# Patient Record
Sex: Female | Born: 1950
Health system: Southern US, Community
[De-identification: ages and names within clinical notes are randomized; demographics above are authoritative.]

## PROBLEM LIST (undated history)

## (undated) DIAGNOSIS — E785 Hyperlipidemia, unspecified: Secondary | ICD-10-CM

## (undated) DIAGNOSIS — K219 Gastro-esophageal reflux disease without esophagitis: Secondary | ICD-10-CM

## (undated) DIAGNOSIS — R112 Nausea with vomiting, unspecified: Secondary | ICD-10-CM

## (undated) DIAGNOSIS — M1990 Unspecified osteoarthritis, unspecified site: Secondary | ICD-10-CM

## (undated) DIAGNOSIS — T7840XA Allergy, unspecified, initial encounter: Secondary | ICD-10-CM

## (undated) DIAGNOSIS — F419 Anxiety disorder, unspecified: Secondary | ICD-10-CM

## (undated) DIAGNOSIS — I10 Essential (primary) hypertension: Secondary | ICD-10-CM

## (undated) HISTORY — DX: Essential (primary) hypertension: I10

## (undated) HISTORY — DX: Allergy, unspecified, initial encounter: T78.40XA

## (undated) HISTORY — DX: Hyperlipidemia, unspecified: E78.5

## (undated) HISTORY — DX: Anxiety disorder, unspecified: F41.9

---

## 2004-01-17 ENCOUNTER — Ambulatory Visit: Payer: Self-pay

## 2004-02-21 ENCOUNTER — Inpatient Hospital Stay: Payer: Self-pay | Admitting: Unknown Physician Specialty

## 2004-05-22 ENCOUNTER — Ambulatory Visit: Payer: Self-pay | Admitting: Unknown Physician Specialty

## 2004-06-04 ENCOUNTER — Ambulatory Visit: Payer: Self-pay | Admitting: Unknown Physician Specialty

## 2005-01-09 HISTORY — PX: TOTAL ABDOMINAL HYSTERECTOMY: SHX209

## 2005-04-11 ENCOUNTER — Ambulatory Visit: Payer: Self-pay | Admitting: Family Medicine

## 2005-04-11 DIAGNOSIS — L719 Rosacea, unspecified: Secondary | ICD-10-CM | POA: Insufficient documentation

## 2005-05-21 ENCOUNTER — Ambulatory Visit: Payer: Self-pay | Admitting: Family Medicine

## 2005-05-23 ENCOUNTER — Ambulatory Visit: Payer: Self-pay | Admitting: Family Medicine

## 2005-06-09 ENCOUNTER — Encounter: Payer: Self-pay | Admitting: Family Medicine

## 2005-06-24 ENCOUNTER — Ambulatory Visit: Payer: Self-pay | Admitting: Family Medicine

## 2005-06-26 ENCOUNTER — Ambulatory Visit: Payer: Self-pay | Admitting: Family Medicine

## 2005-09-25 ENCOUNTER — Ambulatory Visit: Payer: Self-pay | Admitting: Family Medicine

## 2005-11-12 ENCOUNTER — Ambulatory Visit: Payer: Self-pay | Admitting: Family Medicine

## 2006-07-31 ENCOUNTER — Encounter: Payer: Self-pay | Admitting: Family Medicine

## 2006-07-31 DIAGNOSIS — E1165 Type 2 diabetes mellitus with hyperglycemia: Secondary | ICD-10-CM

## 2006-07-31 DIAGNOSIS — F411 Generalized anxiety disorder: Secondary | ICD-10-CM | POA: Insufficient documentation

## 2006-07-31 DIAGNOSIS — I10 Essential (primary) hypertension: Secondary | ICD-10-CM | POA: Insufficient documentation

## 2006-07-31 DIAGNOSIS — J309 Allergic rhinitis, unspecified: Secondary | ICD-10-CM | POA: Insufficient documentation

## 2006-07-31 DIAGNOSIS — E119 Type 2 diabetes mellitus without complications: Secondary | ICD-10-CM | POA: Insufficient documentation

## 2006-08-04 DIAGNOSIS — T7840XA Allergy, unspecified, initial encounter: Secondary | ICD-10-CM | POA: Insufficient documentation

## 2006-08-05 ENCOUNTER — Ambulatory Visit: Payer: Self-pay | Admitting: Family Medicine

## 2006-08-05 LAB — CONVERTED CEMR LAB
ALT: 39 units/L (ref 0–40)
BUN: 15 mg/dL (ref 6–23)
Bilirubin, Direct: 0.1 mg/dL (ref 0.0–0.3)
CO2: 31 meq/L (ref 19–32)
Calcium: 9.5 mg/dL (ref 8.4–10.5)
Cholesterol: 264 mg/dL (ref 0–200)
GFR calc Af Amer: 112 mL/min
Glucose, Bld: 120 mg/dL — ABNORMAL HIGH (ref 70–99)
Microalb Creat Ratio: 2.9 mg/g (ref 0.0–30.0)
Total CHOL/HDL Ratio: 6
Total Protein: 6.6 g/dL (ref 6.0–8.3)
VLDL: 40 mg/dL (ref 0–40)

## 2006-08-07 ENCOUNTER — Ambulatory Visit: Payer: Self-pay | Admitting: Family Medicine

## 2006-08-07 ENCOUNTER — Encounter: Payer: Self-pay | Admitting: Family Medicine

## 2006-08-07 ENCOUNTER — Other Ambulatory Visit: Admission: RE | Admit: 2006-08-07 | Discharge: 2006-08-07 | Payer: Self-pay | Admitting: Family Medicine

## 2006-08-07 LAB — CONVERTED CEMR LAB: Pap Smear: NORMAL

## 2006-08-13 ENCOUNTER — Encounter (INDEPENDENT_AMBULATORY_CARE_PROVIDER_SITE_OTHER): Payer: Self-pay | Admitting: *Deleted

## 2006-08-15 ENCOUNTER — Encounter: Payer: Self-pay | Admitting: Family Medicine

## 2006-08-18 ENCOUNTER — Telehealth (INDEPENDENT_AMBULATORY_CARE_PROVIDER_SITE_OTHER): Payer: Self-pay | Admitting: *Deleted

## 2006-08-19 ENCOUNTER — Ambulatory Visit: Payer: Self-pay | Admitting: Family Medicine

## 2006-08-28 ENCOUNTER — Ambulatory Visit: Payer: Self-pay | Admitting: Family Medicine

## 2006-08-28 ENCOUNTER — Encounter: Payer: Self-pay | Admitting: Family Medicine

## 2006-09-09 ENCOUNTER — Encounter (INDEPENDENT_AMBULATORY_CARE_PROVIDER_SITE_OTHER): Payer: Self-pay | Admitting: *Deleted

## 2006-09-10 ENCOUNTER — Ambulatory Visit: Payer: Self-pay | Admitting: Family Medicine

## 2006-09-10 ENCOUNTER — Encounter (INDEPENDENT_AMBULATORY_CARE_PROVIDER_SITE_OTHER): Payer: Self-pay | Admitting: *Deleted

## 2006-09-11 ENCOUNTER — Telehealth (INDEPENDENT_AMBULATORY_CARE_PROVIDER_SITE_OTHER): Payer: Self-pay | Admitting: *Deleted

## 2006-09-18 ENCOUNTER — Ambulatory Visit: Payer: Self-pay | Admitting: Family Medicine

## 2006-09-18 LAB — CONVERTED CEMR LAB
ALT: 36 units/L — ABNORMAL HIGH (ref 0–35)
AST: 31 units/L (ref 0–37)

## 2006-11-04 ENCOUNTER — Ambulatory Visit: Payer: Self-pay | Admitting: Family Medicine

## 2006-11-04 LAB — CONVERTED CEMR LAB
AST: 22 units/L (ref 0–37)
Cholesterol: 162 mg/dL (ref 0–200)
HDL: 41.4 mg/dL (ref >39.0)
LDL Cholesterol: 92 mg/dL (ref 0–99)

## 2006-11-07 ENCOUNTER — Ambulatory Visit: Payer: Self-pay | Admitting: Family Medicine

## 2006-12-12 ENCOUNTER — Telehealth: Payer: Self-pay | Admitting: Family Medicine

## 2007-07-16 ENCOUNTER — Ambulatory Visit: Payer: Self-pay | Admitting: Family Medicine

## 2007-07-16 LAB — CONVERTED CEMR LAB
ALT: 34 units/L (ref 0–35)
Albumin: 3.9 g/dL (ref 3.5–5.2)
BUN: 19 mg/dL (ref 6–23)
Calcium: 9.5 mg/dL (ref 8.4–10.5)
Cholesterol: 142 mg/dL (ref 0–200)
Creatinine, Ser: 0.7 mg/dL (ref 0.4–1.2)
GFR calc Af Amer: 112 mL/min
Glucose, Bld: 129 mg/dL — ABNORMAL HIGH (ref 70–99)
HDL: 39 mg/dL (ref >39.0)
Microalb Creat Ratio: 15 mg/g (ref 0.0–30.0)
Microalb, Ur: 1.7 mg/dL (ref 0.0–1.9)
Total Protein: 6.6 g/dL (ref 6.0–8.3)
Triglycerides: 127 mg/dL (ref 0–149)
VLDL: 25 mg/dL (ref 0–40)

## 2007-07-21 ENCOUNTER — Other Ambulatory Visit: Admission: RE | Admit: 2007-07-21 | Discharge: 2007-07-21 | Payer: Self-pay | Admitting: Family Medicine

## 2007-07-21 ENCOUNTER — Encounter: Payer: Self-pay | Admitting: Family Medicine

## 2007-07-21 ENCOUNTER — Ambulatory Visit: Payer: Self-pay | Admitting: Family Medicine

## 2007-07-21 LAB — CONVERTED CEMR LAB: Pap Smear: NORMAL

## 2007-07-27 ENCOUNTER — Encounter (INDEPENDENT_AMBULATORY_CARE_PROVIDER_SITE_OTHER): Payer: Self-pay | Admitting: *Deleted

## 2007-08-05 ENCOUNTER — Ambulatory Visit: Payer: Self-pay | Admitting: Family Medicine

## 2007-08-05 ENCOUNTER — Encounter (INDEPENDENT_AMBULATORY_CARE_PROVIDER_SITE_OTHER): Payer: Self-pay | Admitting: *Deleted

## 2007-08-05 LAB — CONVERTED CEMR LAB: OCCULT 2: NEGATIVE

## 2007-10-01 ENCOUNTER — Encounter: Payer: Self-pay | Admitting: Family Medicine

## 2007-10-01 ENCOUNTER — Ambulatory Visit: Payer: Self-pay | Admitting: Family Medicine

## 2007-10-05 ENCOUNTER — Encounter (INDEPENDENT_AMBULATORY_CARE_PROVIDER_SITE_OTHER): Payer: Self-pay | Admitting: *Deleted

## 2008-01-14 ENCOUNTER — Ambulatory Visit: Payer: Self-pay | Admitting: Family Medicine

## 2008-01-14 LAB — CONVERTED CEMR LAB: Hgb A1c MFr Bld: 6.8 % — ABNORMAL HIGH (ref 4.6–6.0)

## 2008-01-20 ENCOUNTER — Ambulatory Visit: Payer: Self-pay | Admitting: Family Medicine

## 2008-01-20 DIAGNOSIS — M722 Plantar fascial fibromatosis: Secondary | ICD-10-CM

## 2008-08-18 ENCOUNTER — Ambulatory Visit: Payer: Self-pay | Admitting: Family Medicine

## 2008-08-18 LAB — CONVERTED CEMR LAB
CO2: 29 meq/L (ref 19–32)
GFR calc non Af Amer: 91.98 mL/min (ref >60)
Glucose, Bld: 138 mg/dL — ABNORMAL HIGH (ref 70–99)
Potassium: 4.4 meq/L (ref 3.5–5.1)
Sodium: 143 meq/L (ref 135–145)

## 2008-09-09 ENCOUNTER — Encounter: Payer: Self-pay | Admitting: Family Medicine

## 2008-09-19 ENCOUNTER — Ambulatory Visit: Payer: Self-pay | Admitting: Gastroenterology

## 2008-09-19 ENCOUNTER — Encounter: Payer: Self-pay | Admitting: Family Medicine

## 2008-09-19 LAB — HM COLONOSCOPY: HM Colonoscopy: NORMAL

## 2008-09-20 ENCOUNTER — Telehealth: Payer: Self-pay | Admitting: Family Medicine

## 2008-10-05 ENCOUNTER — Ambulatory Visit: Payer: Self-pay | Admitting: Family Medicine

## 2008-10-05 LAB — CONVERTED CEMR LAB
Alkaline Phosphatase: 86 units/L (ref 39–117)
BUN: 16 mg/dL (ref 6–23)
Basophils Absolute: 0 10*3/uL (ref 0.0–0.1)
Basophils Relative: 0.5 % (ref 0.0–3.0)
Bilirubin, Direct: 0.1 mg/dL (ref 0.0–0.3)
CO2: 30 meq/L (ref 19–32)
Calcium: 9.5 mg/dL (ref 8.4–10.5)
Chloride: 108 meq/L (ref 96–112)
Cholesterol: 144 mg/dL (ref 0–200)
Creatinine, Ser: 0.8 mg/dL (ref 0.4–1.2)
Creatinine,U: 120.4 mg/dL
Eosinophils Absolute: 0.2 10*3/uL (ref 0.0–0.7)
HDL: 38.9 mg/dL — ABNORMAL LOW (ref >39.00)
Hgb A1c MFr Bld: 6.1 % (ref 4.6–6.5)
Lymphocytes Relative: 24.2 % (ref 12.0–46.0)
MCHC: 34.1 g/dL (ref 30.0–36.0)
MCV: 87 fL (ref 78.0–100.0)
Microalb Creat Ratio: 8.3 mg/g (ref 0.0–30.0)
Microalb, Ur: 1 mg/dL (ref 0.0–1.9)
Monocytes Absolute: 0.3 10*3/uL (ref 0.1–1.0)
Neutrophils Relative %: 64.4 % (ref 43.0–77.0)
Platelets: 220 10*3/uL (ref 150.0–400.0)
RBC: 4.66 M/uL (ref 3.87–5.11)
RDW: 13.5 % (ref 11.5–14.6)
Total Bilirubin: 1.2 mg/dL (ref 0.3–1.2)
Total CHOL/HDL Ratio: 4
Total Protein: 7.1 g/dL (ref 6.0–8.3)
Triglycerides: 152 mg/dL — ABNORMAL HIGH (ref 0.0–149.0)

## 2008-10-06 LAB — CONVERTED CEMR LAB: Vit D, 25-Hydroxy: 26 ng/mL — ABNORMAL LOW (ref 30–89)

## 2008-10-13 ENCOUNTER — Ambulatory Visit: Payer: Self-pay | Admitting: Family Medicine

## 2008-10-31 ENCOUNTER — Ambulatory Visit: Payer: Self-pay | Admitting: Family Medicine

## 2008-10-31 ENCOUNTER — Encounter: Payer: Self-pay | Admitting: Family Medicine

## 2008-11-03 ENCOUNTER — Encounter (INDEPENDENT_AMBULATORY_CARE_PROVIDER_SITE_OTHER): Payer: Self-pay | Admitting: *Deleted

## 2008-11-15 ENCOUNTER — Ambulatory Visit: Payer: Self-pay | Admitting: Family Medicine

## 2008-11-15 LAB — CONVERTED CEMR LAB
CO2: 28 meq/L (ref 19–32)
Calcium: 9.2 mg/dL (ref 8.4–10.5)
Creatinine, Ser: 0.7 mg/dL (ref 0.4–1.2)
GFR calc non Af Amer: 91.9 mL/min (ref >60)
Sodium: 140 meq/L (ref 135–145)

## 2008-12-23 ENCOUNTER — Ambulatory Visit: Payer: Self-pay | Admitting: Family Medicine

## 2009-02-14 ENCOUNTER — Telehealth: Payer: Self-pay | Admitting: Family Medicine

## 2009-04-12 ENCOUNTER — Ambulatory Visit: Payer: Self-pay | Admitting: Family Medicine

## 2009-04-12 LAB — CONVERTED CEMR LAB
BUN: 16 mg/dL (ref 6–23)
Chloride: 108 meq/L (ref 96–112)
Creatinine, Ser: 0.8 mg/dL (ref 0.4–1.2)
GFR calc non Af Amer: 78.66 mL/min (ref >60)
Hgb A1c MFr Bld: 6.2 % (ref 4.6–6.5)
Potassium: 4.4 meq/L (ref 3.5–5.1)

## 2009-04-17 ENCOUNTER — Ambulatory Visit: Payer: Self-pay | Admitting: Family Medicine

## 2009-06-05 ENCOUNTER — Ambulatory Visit: Payer: Self-pay | Admitting: Family Medicine

## 2009-08-30 ENCOUNTER — Telehealth: Payer: Self-pay | Admitting: Family Medicine

## 2009-09-18 ENCOUNTER — Encounter: Payer: Self-pay | Admitting: Family Medicine

## 2009-09-26 ENCOUNTER — Encounter: Payer: Self-pay | Admitting: Family Medicine

## 2009-09-26 ENCOUNTER — Telehealth: Payer: Self-pay | Admitting: Family Medicine

## 2009-11-16 ENCOUNTER — Telehealth: Payer: Self-pay | Admitting: Family Medicine

## 2009-11-20 ENCOUNTER — Ambulatory Visit: Payer: Self-pay | Admitting: Family Medicine

## 2009-11-20 ENCOUNTER — Encounter: Payer: Self-pay | Admitting: Family Medicine

## 2009-11-22 ENCOUNTER — Encounter (INDEPENDENT_AMBULATORY_CARE_PROVIDER_SITE_OTHER): Payer: Self-pay | Admitting: *Deleted

## 2009-12-15 ENCOUNTER — Ambulatory Visit: Payer: Self-pay | Admitting: Family Medicine

## 2009-12-15 LAB — HM DIABETES FOOT EXAM

## 2010-01-02 ENCOUNTER — Ambulatory Visit: Payer: Self-pay | Admitting: Family Medicine

## 2010-01-03 ENCOUNTER — Telehealth: Payer: Self-pay | Admitting: Family Medicine

## 2010-01-04 LAB — CONVERTED CEMR LAB
CO2: 25 meq/L (ref 19–32)
Calcium: 9.7 mg/dL (ref 8.4–10.5)
Chloride: 109 meq/L (ref 96–112)
Creatinine, Ser: 0.9 mg/dL (ref 0.4–1.2)
Sodium: 144 meq/L (ref 135–145)

## 2010-01-08 ENCOUNTER — Telehealth: Payer: Self-pay | Admitting: Family Medicine

## 2010-01-30 ENCOUNTER — Telehealth: Payer: Self-pay | Admitting: Family Medicine

## 2010-01-30 ENCOUNTER — Encounter: Payer: Self-pay | Admitting: Family Medicine

## 2010-02-05 ENCOUNTER — Ambulatory Visit: Payer: Self-pay | Admitting: Internal Medicine

## 2010-02-23 ENCOUNTER — Encounter: Payer: Self-pay | Admitting: Family Medicine

## 2010-02-23 ENCOUNTER — Ambulatory Visit: Payer: Self-pay | Admitting: Family Medicine

## 2010-02-23 LAB — CONVERTED CEMR LAB
Glucose, Urine, Semiquant: NEGATIVE
Ketones, urine, test strip: NEGATIVE
Specific Gravity, Urine: >=1.03
pH: 5

## 2010-02-27 ENCOUNTER — Ambulatory Visit: Payer: Self-pay | Admitting: Family Medicine

## 2010-03-06 ENCOUNTER — Ambulatory Visit
Admission: RE | Admit: 2010-03-06 | Discharge: 2010-03-06 | Payer: Self-pay | Source: Home / Self Care | Attending: Internal Medicine | Admitting: Internal Medicine

## 2010-03-09 LAB — CONVERTED CEMR LAB
ALT: 45 units/L — ABNORMAL HIGH (ref 0–35)
AST: 33 units/L (ref 0–37)
Albumin: 3.7 g/dL (ref 3.5–5.2)
HDL: 36.6 mg/dL — ABNORMAL LOW (ref >39.00)
Hgb A1c MFr Bld: 6.6 % — ABNORMAL HIGH (ref 4.6–6.5)
Triglycerides: 174 mg/dL — ABNORMAL HIGH (ref 0.0–149.0)

## 2010-04-09 ENCOUNTER — Ambulatory Visit
Admission: RE | Admit: 2010-04-09 | Discharge: 2010-04-09 | Payer: Self-pay | Source: Home / Self Care | Attending: Family Medicine | Admitting: Family Medicine

## 2010-04-09 DIAGNOSIS — J0100 Acute maxillary sinusitis, unspecified: Secondary | ICD-10-CM | POA: Insufficient documentation

## 2010-04-09 DIAGNOSIS — J019 Acute sinusitis, unspecified: Secondary | ICD-10-CM | POA: Insufficient documentation

## 2010-04-10 NOTE — Progress Notes (Signed)
Summary: Has a cold  Phone Note Outgoing Call Call back at [phone]   Call placed by: Deborah Axon, LPN Summary of Call: Called patient regarding BP check. Patient stated that she has a cold  and wants to know what is safe for her to take OTC. Patient would like for you to call her husband Deborah Alvarado at [phone] and let him know what you recommend because it is hard to reach her at work. Initial call taken by: Deborah Axon LPN,  January 03, 2010 8:53 AM  Follow-up for Phone Call        Tell them that plain tylenol (for pain/fever), robitussen (cough), benadryl or claritin (runny nose) would be reasonable.  Don't take benadryl and claritin together.  I wouldn't take any meds with "-D" in the name (claritin-D) as that can affect BP.  Follow-up by: Crawford Givens MD,  January 03, 2010 12:07 PM  Additional Follow-up for Phone Call Additional follow up Details #1::        Husband, Deborah Alvarado, advised. Additional Follow-up by: Delilah Shan CMA Duncan Dull),  January 03, 2010 12:11 PM

## 2010-04-10 NOTE — Assessment & Plan Note (Signed)
Summary: 6WK F/U FOR BP CHECK / LFW   Vital Signs:  Patient profile:   60 year old female Weight:      188.25 pounds Temp:     98.3 degrees F oral Pulse rate:   80 / minute Pulse rhythm:   regular BP sitting:   118 / 80  (left arm) Cuff size:   large  Vitals Entered By: Sydell Axon LPN (June 05, 2009 10:02 AM) CC: 6 Week follow-up on BP   History of Present Illness: Pt here for BP check after increasing Micardis last visit. She is tolerating the increase w/o difficulty and feels well today w/o complaint.  She gets lotys of exercise at work.  Problems Prior to Update: 1)  Other Screening Mammogram  (ICD-V76.12) 2)  Plantar Fasciitis, Left  (ICD-728.71) 3)  Special Screening Malig Neoplasms Other Sites  (ICD-V76.49) 4)  Acne Rosacea  (ICD-695.3) 5)  Health Maintenance Exam  (ICD-V70.0) 6)  Allergy  (ICD-995.3) 7)  Hypercholesterolemia/ Trig  (ICD-272.0) 8)  Hypertension  (ICD-401.9) 9)  Diabetes Mellitus, Type II  (ICD-250.00) 10)  Anxiety  (ICD-300.00) 11)  Allergic Rhinitis  (ICD-477.9)  Medications Prior to Update: 1)  Metformin Hcl 500 Mg Tabs (Metformin Hcl) .... One Tab By Mouth Two Times A Day 2)  Monodox 100 Mg Caps (Doxycycline Monohydrate) .... One Tab By Mouth Once Daily States Taking As Needed 3)  Simvastatin 80 Mg Tabs (Simvastatin) .... One Tab By Mouth At Night 4)  Fish Oil Concentrate 1000 Mg  Caps (Omega-3 Fatty Acids) .Marland Kitchen.. 1 Daily By Mouth 5)  Glimepiride 2 Mg Tabs (Glimepiride) .... One Tab By Mouth Two Times A Day 6)  Micardis 80 Mg Tabs (Telmisartan) .... One Tab By Mouth Once Daily 7)  Norvasc 5 Mg Tabs (Amlodipine Besylate) .... One Tab By Mouth At Night  Allergies: No Known Drug Allergies  Physical Exam  General:  Well-developed,well-nourished,in no acute distress; alert,appropriate and cooperative throughout examination, mildly obese. Head:  Normocephalic and atraumatic without obvious abnormalities. No apparent alopecia or balding.  Eyes:   Conjunctiva clear bilaterally.  Ears:  External ear exam shows no significant lesions or deformities.  Otoscopic examination reveals clear canals, tympanic membranes are intact bilaterally without bulging, retraction, inflammation or discharge. Hearing is grossly normal bilaterally. Canals mildly flaky. Nose:  External nasal examination shows no deformity or inflammation. Nasal mucosa are pink and moist without lesions or exudates. Mouth:  Oral mucosa and oropharynx without lesions or exudates.  Teeth in good repair. Neck:  No deformities, masses, or tenderness noted. Chest Wall:  No deformities, masses, or tenderness noted. Lungs:  Normal respiratory effort, chest expands symmetrically. Lungs are clear to auscultation, no crackles or wheezes. Heart:  Normal rate and regular rhythm. S1 and S2 normal without gallop, murmur, click, rub or other extra sounds.   Impression & Recommendations:  Problem # 1:  HYPERTENSION (ICD-401.9) Assessment Improved Cont curr meds.  Her updated medication list for this problem includes:    Micardis 80 Mg Tabs (Telmisartan) ..... One tab by mouth once daily    Norvasc 5 Mg Tabs (Amlodipine besylate) ..... One tab by mouth at night  BP today: 118/80 Prior BP: 130/94 (04/17/2009)  Labs Reviewed: K+: 4.4 (04/12/2009) Creat: : 0.8 (04/12/2009)   Chol: 144 (10/05/2008)   HDL: 38.90 (10/05/2008)   LDL: 75 (10/05/2008)   TG: 152.0 (10/05/2008)  Problem # 2:  DIABETES MELLITUS, TYPE II (ICD-250.00) Assessment: Unchanged Control continues good. Nos at home cont low 100s.  Her updated medication list for this problem includes:    Metformin Hcl 500 Mg Tabs (Metformin hcl) ..... One tab by mouth two times a day    Glimepiride 2 Mg Tabs (Glimepiride) ..... One tab by mouth two times a day    Micardis 80 Mg Tabs (Telmisartan) ..... One tab by mouth once daily  Labs Reviewed: Creat: 0.8 (04/12/2009)    Reviewed HgBA1c results: 6.2 (04/12/2009)  6.1  (10/05/2008)  Complete Medication List: 1)  Metformin Hcl 500 Mg Tabs (Metformin hcl) .... One tab by mouth two times a day 2)  Monodox 100 Mg Caps (Doxycycline monohydrate) .... One tab by mouth once daily states taking as needed 3)  Simvastatin 80 Mg Tabs (Simvastatin) .... One tab by mouth at night 4)  Fish Oil Concentrate 1000 Mg Caps (Omega-3 fatty acids) .Marland Kitchen.. 1 daily by mouth 5)  Glimepiride 2 Mg Tabs (Glimepiride) .... One tab by mouth two times a day 6)  Micardis 80 Mg Tabs (Telmisartan) .... One tab by mouth once daily 7)  Norvasc 5 Mg Tabs (Amlodipine besylate) .... One tab by mouth at night 8)  Vitamin D 1000 Unit Tabs (Cholecalciferol) .... Take one by mouth daily  Patient Instructions: 1)  Call in May for appt in Oct/Nov with A1C and Bmet prior. Will then schwedule Comp Exam after being seen.  Current Allergies (reviewed today): No known allergies   Appended Document: 6WK F/U FOR BP CHECK / LFW Consider again getting DEXA scan as discussed last visit when seen next time.

## 2010-04-10 NOTE — Letter (Signed)
Summary: Care Consideration Regarding Eye Exam/[facility]  Care Consideration Regarding Eye Exam/[facility]   Imported By: Lanelle Bal 10/03/2009 13:08:41  _____________________________________________________________________  External Attachment:    Type:   Image     Comment:   External Document

## 2010-04-10 NOTE — Progress Notes (Signed)
Summary: cough  Phone Note Call from Patient Call back at Home Phone [phone]   Caller: Patient Call For: Dr. Para March  Summary of Call: Patient states that she has had a nagging cough for a couple of weeks. She says that it keeps her awake at night. She has tried Robitussin, and has also tried mucinex. She says that neither have worked. She is asking if she could get something for the cough called [facility] to [facility]. Please advise.  Initial call taken by: Melody Comas,  January 08, 2010 4:39 PM  Follow-up for Phone Call        please call in.  If cough persists, needs OV as this may be related to lisinopril. sedation caution on the cough syrup.  please call into [facility].  Follow-up by: Crawford Givens MD,  January 08, 2010 4:53 PM  Additional Follow-up for Phone Call Additional follow up Details #1::        Patient Advised.  Medication phoned to pharmacy.  Additional Follow-up by: Delilah Shan CMA (AAMA),  January 08, 2010 5:01 PM    New/Updated Medications: HYDROMET 5-1.5 MG/5ML SYRP (HYDROCODONE-HOMATROPINE) 5 ml by mouth three times a day as needed for cough with sedation caution Prescriptions: HYDROMET 5-1.5 MG/5ML SYRP (HYDROCODONE-HOMATROPINE) 5 ml by mouth three times a day as needed for cough with sedation caution  #4oz x 0   Entered and Authorized by:   Crawford Givens MD   Signed by:   Crawford Givens MD on 01/08/2010   Method used:   Telephoned to ...       [facility] [phone]* (retail)       [address]       [place], [place]  [address]       Ph: [phone]       Fax: [phone]   RxID:   [id number]

## 2010-04-10 NOTE — Medication Information (Signed)
Summary: Order for Diabetes Testing Supplies/[facility]  Order for Diabetes Testing Supplies/[facility]   Imported By: Maryln Gottron 02/05/2010 15:02:44  _____________________________________________________________________  External Attachment:    Type:   Image     Comment:   External Document

## 2010-04-10 NOTE — Assessment & Plan Note (Signed)
Summary: GET ESTABLISHED- TRANSFER FROM DR [facility]   Vital Signs:  Patient profile:   60 year old female Height:      64 inches Weight:      186.50 pounds BMI:     32.13 Temp:     97.9 degrees F oral Pulse rate:   80 / minute Pulse rhythm:   regular BP sitting:   166 / 106  (left arm) Cuff size:   regular  Vitals Entered By: Delilah Shan CMA Duncan Dull) (December 15, 2009 8:09 AM) CC: Get Established from [facility]   History of Present Illness: Elevated Cholesterol: Using medications without problems:off meds due to pharmacy call Muscle aches: not prev on med Other complaints: no  Hypertension:      Using medication without problems or lightheadedness: yes, but off norvasc for a few weeks Chest pain with exertion:no Edema:no Short of breath:no Average home BPs:not checked Other issues: no  Diabetes:  Using medications without difficulties:yes Hypoglycemic episodes: rare Hyperglycemic episodes: no symptoms  Feet problems: no  Blood Sugars averaging: not checked eye exam within last year: follow up planned for next few months per patient  Allergies: No Known Drug Allergies  Past History:  Family History: Last updated: 12/15/2009 Father: Unknown Mother: Dec 20's from leukemia when the patient was 52 years old Brother A   DM  Social History: Last updated: 12/15/2009 Marital Status: Married, 1972 Children: 2, out of the home Occupation:  works at [employer] minimal exercise no tob  no alcohol   Past Medical History: Allergic rhinitis Anxiety Diabetes mellitus, type II Hypertension HLD  Past Surgical History: NSVD x 2 TAH due to fibroids  01/2005 Colonoscopy 2010 Nml (Dr Bluford Kaufmann)   10 yrs  Family History: Father: Unknown Mother: Dec 20's from leukemia when the patient was 5 years old Brother A   DM  Social History: Marital Status: Married, 1972 Children: 2, out of the home Occupation:  works at [employer] minimal exercise no tob  no alcohol   Review of  Systems       See HPI.  Otherwise negative.    Physical Exam  General:  GEN: nad, alert and oriented HEENT: mucous membranes moist NECK: supple w/o LA CV: rrr.  no murmur PULM: ctab, no inc wob ABD: soft, +bs EXT: no edema SKIN: no acute rash   Diabetes Management Exam:    Foot Exam (with socks and/or shoes not present):       Sensory-Pinprick/Light touch:          Left medial foot (L-4): normal          Left dorsal foot (L-5): normal          Left lateral foot (S-1): normal          Right medial foot (L-4): normal          Right dorsal foot (L-5): normal          Right lateral foot (S-1): normal       Sensory-Monofilament:          Left foot: normal          Right foot: normal       Inspection:          Left foot: normal          Right foot: normal       Nails:          Left foot: normal          Right foot:  normal   Impression & Recommendations:  Problem # 1:  HYPERCHOLESTEROLEMIA/ TRIG (ICD-272.0) decrease statin to 40mg ; patient prev tolerated and didn't ever have any adverse effect.  stop norvasc. The following medications were removed from the medication list:    Simvastatin 80 Mg Tabs (Simvastatin) ..... Hold  one tab by mouth at night Her updated medication list for this problem includes:    Simvastatin 40 Mg Tabs (Simvastatin) .Marland Kitchen... 1 by mouth once daily  Problem # 2:  HYPERTENSION (ICD-401.9) Change to ACE due to cost.  See instructions.  d/w patient re: low risk of cough.  The following medications were removed from the medication list:    Micardis 80 Mg Tabs (Telmisartan) ..... One tab by mouth once daily    Norvasc 5 Mg Tabs (Amlodipine besylate) ..... One tab by mouth at night Her updated medication list for this problem includes:    Lisinopril 40 Mg Tabs (Lisinopril) .Marland Kitchen... 1 by mouth once daily  Problem # 3:  DIABETES MELLITUS, TYPE II (ICD-250.00) continue current meds.  return for labs later on.  d/w patient MW:[id number] and diet.  she understood.   The following medications were removed from the medication list:    Micardis 80 Mg Tabs (Telmisartan) ..... One tab by mouth once daily Her updated medication list for this problem includes:    Metformin Hcl 500 Mg Tabs (Metformin hcl) ..... One tab by mouth two times a day    Glimepiride 2 Mg Tabs (Glimepiride) ..... One tab by mouth two times a day    Lisinopril 40 Mg Tabs (Lisinopril) .Marland Kitchen... 1 by mouth once daily  Complete Medication List: 1)  Metformin Hcl 500 Mg Tabs (Metformin hcl) .... One tab by mouth two times a day 2)  Monodox 100 Mg Caps (Doxycycline monohydrate) .... One tab by mouth once daily states taking as needed 3)  Fish Oil Concentrate 1000 Mg Caps (Omega-3 fatty acids) .Marland Kitchen.. 1 daily by mouth 4)  Glimepiride 2 Mg Tabs (Glimepiride) .... One tab by mouth two times a day 5)  Vitamin D 1000 Unit Tabs (Cholecalciferol) .... Take one by mouth daily 6)  Lisinopril 40 Mg Tabs (Lisinopril) .Marland Kitchen.. 1 by mouth once daily 7)  Simvastatin 40 Mg Tabs (Simvastatin) .Marland Kitchen.. 1 by mouth once daily   Patient Instructions: 1)  Come back for nonfasting labs in about 2 weeks.  BMET---401.1 2)  Have a nurse check your pressure at that visit and let me know.  I'll notify you about what you need to do with your lisinopril dose.   3)  Plan on coming back in 2 months for fasting labs- lipid, hepatic panel-272.0  and A1c-250.00. 4)  Take care.  I was glad to see you.  Prescriptions: SIMVASTATIN 40 MG TABS (SIMVASTATIN) 1 by mouth once daily  #90 x 3   Entered and Authorized by:   Crawford Givens MD   Signed by:   Crawford Givens MD on 12/15/2009   Method used:   [facility]        [facility]  [address] [phone][facility] (retail)       [address]       [place], [place]  [address]       Ph: [phone]       Fax: [phone]   RxID:   [id number] LISINOPRIL 40 MG TABS (LISINOPRIL) 1 by mouth once daily  #90 x 3   Entered and Authorized by:  Crawford Givens MD   Signed by:   Crawford Givens MD on  12/15/2009   Method used:   Electronically to        [facility]  [address] [phone]* (retail)       [address]       [place], [place]  [address]       Ph: [phone]       Fax: [phone]   RxID:   [id number]   Current Allergies (reviewed today): No known allergies    Appended Document: GET ESTABLISHED- TRANSFER FROM DR [facility] Flu Vaccine Consent Questions     Do you have a history of severe allergic reactions to this vaccine? no    Any prior history of allergic reactions to egg and/or gelatin? no    Do you have a sensitivity to the preservative Thimersol? no    Do you have a past history of Guillan-Barre Syndrome? no    Do you currently have an acute febrile illness? no    Have you ever had a severe reaction to latex? no    Vaccine information given and explained to patient? yes    Are you currently pregnant? no    Lot Number:[id number]   Exp Date:09/08/2010   Site Given  Left Deltoid IM Lugene Fuquay CMA (AAMA)  December 15, 2009 8:57 AM

## 2010-04-10 NOTE — Progress Notes (Signed)
Summary: Rx Meloxicam  Phone Note Refill Request Call back at [phone] Message from:  [facility] on August 30, 2009 4:53 PM  Received refill request from pharmacy for Meloxicam 7.5 mg, take one tablet by mouth every day. Medication is not on med sheet   Method Requested: Electronic Initial call taken by: Sydell Axon LPN,  August 30, 2009 4:54 PM  Follow-up for Phone Call        I don't have this pt ever on this medication per her medication  review. I would prefer she not use it as well as it can make her BP go up, as can all NSAIDs incl Aleve and Advil, Motrin or IBP. Use high dose Tyl 500mg  2 three times a day....very safe and not the BP risk. Follow-up by: Shaune Leeks MD,  August 30, 2009 5:02 PM  Additional Follow-up for Phone Call Additional follow up Details #1::        No answer at either number. Sydell Axon LPN  August 30, 2009 5:07 PM  Patient notified as instructed by telephone. Was informed by patient that she has not taken this for a while and did not request this from her pharmacy. Patient stated that she will call her pharmacy and advise them of this and make sure that they take this off of automatic refills. Additional Follow-up by: Sydell Axon LPN,  August 31, 2009 8:36 AM

## 2010-04-10 NOTE — Progress Notes (Signed)
Summary: wants order for mammogram  Phone Note Call from Patient Call back at Home Phone [phone]   Caller: Patient Summary of Call: Pt requests order for mammogram, she goes to [facility] breast center. Initial call taken by: Lowella Petties CMA,  November 16, 2009 3:30 PM  New Problems: OTHER SCREENING MAMMOGRAM (ICD-V76.12)   New Problems: OTHER SCREENING MAMMOGRAM (ICD-V76.12)    Complete Medication List: 1)  Metformin Hcl 500 Mg Tabs (Metformin hcl) .... One tab by mouth two times a day 2)  Monodox 100 Mg Caps (Doxycycline monohydrate) .... One tab by mouth once daily states taking as needed 3)  Simvastatin 80 Mg Tabs (Simvastatin) .... One tab by mouth at night 4)  Fish Oil Concentrate 1000 Mg Caps (Omega-3 fatty acids) .Marland Kitchen.. 1 daily by mouth 5)  Glimepiride 2 Mg Tabs (Glimepiride) .... One tab by mouth two times a day 6)  Micardis 80 Mg Tabs (Telmisartan) .... One tab by mouth once daily 7)  Norvasc 5 Mg Tabs (Amlodipine besylate) .... One tab by mouth at night 8)  Vitamin D 1000 Unit Tabs (Cholecalciferol) .... Take one by mouth daily  Other Orders: Radiology Referral (Radiology)

## 2010-04-10 NOTE — Medication Information (Signed)
Summary: [facility]  [facility]   Imported By: Lanelle Bal 10/03/2009 12:58:34  _____________________________________________________________________  External Attachment:    Type:   Image     Comment:   External Document

## 2010-04-10 NOTE — Assessment & Plan Note (Signed)
Summary: F/U AFTER LABS / LFW   Vital Signs:  Patient profile:   60 year old female Weight:      187.75 pounds Temp:     97.8 degrees F oral Pulse rate:   84 / minute Pulse rhythm:   regular BP sitting:   130 / 94  (left arm) Cuff size:   large  Vitals Entered By: Sydell Axon LPN (April 17, 2009 8:11 AM) CC: Follow-up on labs   History of Present Illness: Pt here for 6 month followup. Had increased he Micardis last time for BP control. She was told plast night a fellow worker quit. She feels well except for continued left elbow pain from her prior fracture and related left trapezial discomfort .Marland KitchenMarland Kitchenpresumably related to altered mechanics with the elbow.  Problems Prior to Update: 1)  Other Screening Mammogram  (ICD-V76.12) 2)  Plantar Fasciitis, Left  (ICD-728.71) 3)  Special Screening Malig Neoplasms Other Sites  (ICD-V76.49) 4)  Acne Rosacea  (ICD-695.3) 5)  Health Maintenance Exam  (ICD-V70.0) 6)  Allergy  (ICD-995.3) 7)  Hypercholesterolemia/ Trig  (ICD-272.0) 8)  Hypertension  (ICD-401.9) 9)  Diabetes Mellitus, Type II  (ICD-250.00) 10)  Anxiety  (ICD-300.00) 11)  Allergic Rhinitis  (ICD-477.9)  Medications Prior to Update: 1)  Metformin Hcl 500 Mg Tabs (Metformin Hcl) .... One Tab By Mouth Two Times A Day 2)  Micardis 40 Mg Tabs (Telmisartan) .... Take One By Mouth At Night 3)  Monodox 100 Mg Caps (Doxycycline Monohydrate) .... One Tab By Mouth Once Daily States Taking As Needed 4)  Simvastatin 80 Mg Tabs (Simvastatin) .... One Tab By Mouth At Night 5)  Fish Oil Concentrate 1000 Mg  Caps (Omega-3 Fatty Acids) .Marland Kitchen.. 1 Daily By Mouth 6)  Glimepiride 2 Mg Tabs (Glimepiride) .... One Tab By Mouth Two Times A Day 7)  Micardis 80 Mg Tabs (Telmisartan) .... One Tab By Mouth Once Daily 8)  Fluconazole 150 Mg Tabs (Fluconazole) .... One Tab By Mouth Qd  Allergies: No Known Drug Allergies  Physical Exam  General:  Well-developed,well-nourished,in no acute distress;  alert,appropriate and cooperative throughout examination, mildly obese. Head:  Normocephalic and atraumatic without obvious abnormalities. No apparent alopecia or balding.  Eyes:  Conjunctiva clear bilaterally.  Ears:  External ear exam shows no significant lesions or deformities.  Otoscopic examination reveals clear canals, tympanic membranes are intact bilaterally without bulging, retraction, inflammation or discharge. Hearing is grossly normal bilaterally. Canals mildly flaky. Nose:  External nasal examination shows no deformity or inflammation. Nasal mucosa are pink and moist without lesions or exudates. Mouth:  Oral mucosa and oropharynx without lesions or exudates.  Teeth in good repair. Neck:  No deformities, masses, or tenderness noted. Chest Wall:  No deformities, masses, or tenderness noted. Lungs:  Normal respiratory effort, chest expands symmetrically. Lungs are clear to auscultation, no crackles or wheezes. Heart:  Normal rate and regular rhythm. S1 and S2 normal without gallop, murmur, click, rub or other extra sounds. Msk:  L trap area superiorly minimally tender but tight.   Impression & Recommendations:  Problem # 1:  HYPERTENSION (ICD-401.9)  The following medications were removed from the medication list:    Micardis 40 Mg Tabs (Telmisartan) .Marland Kitchen... Take one by mouth at night Her updated medication list for this problem includes:    Micardis 80 Mg Tabs (Telmisartan) ..... One tab by mouth once daily    Norvasc 5 Mg Tabs (Amlodipine besylate) ..... One tab by mouth at night  BP today: 130/94  BP by me 160/95   Will add Amlodipine 5mg . Recheck in 6 weeks. Prior BP: 140/86 (10/13/2008)  Labs Reviewed: K+: 4.4 (04/12/2009) Creat: : 0.8 (04/12/2009)   Chol: 144 (10/05/2008)   HDL: 38.90 (10/05/2008)   LDL: 75 (10/05/2008)   TG: 152.0 (10/05/2008)  BP today: 130/94 Prior BP: 140/86 (10/13/2008)  Labs Reviewed: K+: 4.4 (04/12/2009) Creat: : 0.8 (04/12/2009)   Chol: 144  (10/05/2008)   HDL: 38.90 (10/05/2008)   LDL: 75 (10/05/2008)   TG: 152.0 (10/05/2008)  Problem # 2:  DIABETES MELLITUS, TYPE II (ICD-250.00) Assessment: Unchanged  Stable. Cont curr therapy.  The following medications were removed from the medication list:    Micardis 40 Mg Tabs (Telmisartan) .Marland Kitchen... Take one by mouth at night Her updated medication list for this problem includes:    Metformin Hcl 500 Mg Tabs (Metformin hcl) ..... One tab by mouth two times a day    Glimepiride 2 Mg Tabs (Glimepiride) ..... One tab by mouth two times a day    Micardis 80 Mg Tabs (Telmisartan) ..... One tab by mouth once daily  Labs Reviewed: Creat: 0.8 (04/12/2009)    Reviewed HgBA1c results: 6.2 (04/12/2009)  6.1 (10/05/2008)  Problem # 3:  HYPERCHOLESTEROLEMIA/ TRIG (ICD-272.0) Assessment: Unchanged Adequate, cont curr meds. Script written. Her updated medication list for this problem includes:    Simvastatin 80 Mg Tabs (Simvastatin) ..... One tab by mouth at night  Labs Reviewed: SGOT: 53 (10/05/2008)   SGPT: 67 (10/05/2008)   HDL:38.90 (10/05/2008), 39.0 (07/16/2007)  LDL:75 (10/05/2008), 78 (07/16/2007)  Chol:144 (10/05/2008), 142 (07/16/2007)  Trig:152.0 (10/05/2008), 127 (07/16/2007)  Complete Medication List: 1)  Metformin Hcl 500 Mg Tabs (Metformin hcl) .... One tab by mouth two times a day 2)  Monodox 100 Mg Caps (Doxycycline monohydrate) .... One tab by mouth once daily states taking as needed 3)  Simvastatin 80 Mg Tabs (Simvastatin) .... One tab by mouth at night 4)  Fish Oil Concentrate 1000 Mg Caps (Omega-3 fatty acids) .Marland Kitchen.. 1 daily by mouth 5)  Glimepiride 2 Mg Tabs (Glimepiride) .... One tab by mouth two times a day 6)  Micardis 80 Mg Tabs (Telmisartan) .... One tab by mouth once daily 7)  Norvasc 5 Mg Tabs (Amlodipine besylate) .... One tab by mouth at night  Patient Instructions: 1)  RTC 6 weeks for BP check. Prescriptions: NORVASC 5 MG TABS (AMLODIPINE BESYLATE) one tab by  mouth at night  #90 x 3   Entered and Authorized by:   Shaune Leeks MD   Signed by:   Shaune Leeks MD on 04/17/2009   Method used:   Print then Give to Patient   RxID:   [id number] SIMVASTATIN 80 MG TABS (SIMVASTATIN) one tab by mouth at night  #90 x 3   Entered by:   Sydell Axon LPN   Authorized by:   Shaune Leeks MD   Signed by:   Sydell Axon LPN on 78/46/9629   Method used:   Print then Give to Patient   RxID:   [id number]   Current Allergies (reviewed today): No known allergies

## 2010-04-10 NOTE — Letter (Signed)
Summary: Results Follow up Letter  [facility]  [address] [place], [place] [address]   Phone: [phone]  Fax: [phone]    11/22/2009 MRN: [id number]  Deborah Alvarado [address] [place], [place]  [address]  Dear Ms. Aurther Loft,  The following are the results of your recent test(s):  Test         Result    Pap Smear:        Normal _____  Not Normal _____ Comments: ______________________________________________________ Cholesterol: LDL(Bad cholesterol):         Your goal is less than:         HDL (Good cholesterol):       Your goal is more than: Comments:  ______________________________________________________ Mammogram:        Normal __X___  Not Normal _____ Comments:Repeat in 1 year  ___________________________________________________________________ Hemoccult:        Normal _____  Not normal _______ Comments:    _____________________________________________________________________ Other Tests:    We routinely do not discuss normal results over the telephone.  If you desire a copy of the results, or you have any questions about this information we can discuss them at your next office visit.   Sincerely,     Kim Dance,CMA(AAMA)for Dr. Raechel Ache

## 2010-04-10 NOTE — Progress Notes (Signed)
Summary: form for diabetic supplies  Phone Note From Pharmacy   Caller: [facility] Summary of Call: Form for diabetic supplies is on your desk.  I have not been able to get in touch with pt to know whether or not she wants these. Initial call taken by: Lowella Petties CMA, AAMA,  January 30, 2010 10:03 AM  Follow-up for Phone Call        signed.  in my outbox.  Follow-up by: Crawford Givens MD,  January 30, 2010 1:44 PM  Additional Follow-up for Phone Call Additional follow up Details #1::        Faxed and sent to scan. Additional Follow-up by: Delilah Shan CMA Duncan Dull),  January 30, 2010 2:27 PM

## 2010-04-10 NOTE — Assessment & Plan Note (Signed)
Summary: RECHECK BP/DLO  Nurse Visit   Vital Signs:  Patient profile:   60 year old female Temp:     98.2 degrees F oral Pulse rate:   76 / minute Pulse rhythm:   regular BP sitting:   124 / 78  (left arm)  Vitals Entered By: Sydell Axon LPN (January 02, 2010 8:24 AM) CC: Here for BP check, patient is taking her medication as prescribed and is not having any problems with medications at this time   Allergies: No Known Drug Allergies    Impression & Recommendations:  Problem # 1:  HYPERTENSION (ICD-401.9) BP is fine.  Have her continue the lisinopril and I'll look the labs when resulted.  Her updated medication list for this problem includes:    Lisinopril 40 Mg Tabs (Lisinopril) .Marland Kitchen... 1 by mouth once daily  Complete Medication List: 1)  Metformin Hcl 500 Mg Tabs (Metformin hcl) .... One tab by mouth two times a day 2)  Monodox 100 Mg Caps (Doxycycline monohydrate) .... One tab by mouth once daily states taking as needed 3)  Fish Oil Concentrate 1000 Mg Caps (Omega-3 fatty acids) .Marland Kitchen.. 1 daily by mouth 4)  Glimepiride 2 Mg Tabs (Glimepiride) .... One tab by mouth two times a day 5)  Vitamin D 1000 Unit Tabs (Cholecalciferol) .... Take one by mouth daily 6)  Lisinopril 40 Mg Tabs (Lisinopril) .Marland Kitchen.. 1 by mouth once daily 7)  Simvastatin 40 Mg Tabs (Simvastatin) .Marland Kitchen.. 1 by mouth once daily  Left message at home number for patient to call back. Sydell Axon LPN  January 02, 2010 2:16 PM Patient notified as instructed by telephone. Sydell Axon LPN  January 03, 2010 8:48 AM

## 2010-04-10 NOTE — Progress Notes (Signed)
Summary: form regarding drug interaction  Phone Note From Pharmacy   Caller: [facility]  [address] [phone]* Deborah Alvarado Summary of Call: Form regarding drug interaction between simvastatin and amlodipine is on your desk. Initial call taken by: Lowella Petties CMA,  September 26, 2009 2:48 PM  Follow-up for Phone Call        Given the patient's high risk with DM, HLD, HTN, I would continue the meds.  she has been tolerating and she will have monitoring in the clinic with OV and labs.  Please make sure she is schedule for OV in next 2 months.  Follow-up by: Crawford Givens MD,  September 27, 2009 11:39 AM  Additional Follow-up for Phone Call Additional follow up Details #1::        No answer, no VM Lugene Fuquay CMA Deondre Marinaro Dull)  September 27, 2009 4:27 PM  Left message on voicemail  to return call. Lugene Fuquay CMA (AAMA)  September 28, 2009 4:08 PM   Advised pt, appt made. Additional Follow-up by: Lowella Petties CMA,  September 29, 2009 10:10 AM

## 2010-04-10 NOTE — Assessment & Plan Note (Signed)
Summary: COUGH/CLE   Vital Signs:  Patient profile:   60 year old female Weight:      184.75 pounds Temp:     98.4 degrees F oral Pulse rate:   84 / minute Pulse rhythm:   regular BP sitting:   132 / 80  (left arm) Cuff size:   regular  Vitals Entered By: Selena Batten Dance CMA (AAMA) (February 05, 2010 10:29 AM) CC: Cough   History of Present Illness: CC: cough  59yo DM, HTN with 1 mo h/o cough, tried hydromet, mucinex, robitussin, not helping.  Started as sinus issues, still feels draining down throat.  + productive of mucous, sometimes emesis after cough.  Feels like something hung in back of throat, feels significant congestion.   ++ PNDrip (even currently).  + hoarse with talking occasionally.  + green and clear purulent nasal d/c.  + sinus pressure headahces occasionally behind eyes.  No tooth pain, + ear stopped up.  + pressure worsens with bending head forward.  No fevers/chills, no abd pain, n/d, rashes, myalgia/arthralgias.  No recent weight changes.  nonsmoker  + family sick over last month, they all improved, no more cough.  Only patient has residual cough.  h/o DM on lisinopril, doesn't think coming from this.  + h/o allergies, not currently on anything.  No h/o asthma.  Mild reflux - not recently gotten worse.  Not on anything for this.  Current Medications (verified): 1)  Metformin Hcl 500 Mg Tabs (Metformin Hcl) .... One Tab By Mouth Two Times A Day 2)  Monodox 100 Mg Caps (Doxycycline Monohydrate) .... One Tab By Mouth Once Daily States Taking As Needed 3)  Fish Oil Concentrate 1000 Mg  Caps (Omega-3 Fatty Acids) .Marland Kitchen.. 1 Daily By Mouth 4)  Glimepiride 2 Mg Tabs (Glimepiride) .... One Tab By Mouth Two Times A Day 5)  Vitamin D 1000 Unit Tabs (Cholecalciferol) .... Take One By Mouth Daily 6)  Lisinopril 40 Mg Tabs (Lisinopril) .Marland Kitchen.. 1 By Mouth Once Daily 7)  Simvastatin 40 Mg Tabs (Simvastatin) .Marland Kitchen.. 1 By Mouth Once Daily 8)  Hydromet 5-1.5 Mg/71ml Syrp (Hydrocodone-Homatropine)  .... 5 Ml By Mouth Three Times A Day As Needed For Cough With Sedation Caution  Allergies (verified): No Known Drug Allergies  Past History:  Past Medical History: Last updated: 12/15/2009 Allergic rhinitis Anxiety Diabetes mellitus, type II Hypertension HLD  Social History: Last updated: 12/15/2009 Marital Status: Married, 1972 Children: 2, out of the home Occupation:  works at [employer] minimal exercise no tob  no alcohol   Review of Systems       per HPI  Physical Exam  General:  NAD, WDWN Head:  Normocephalic and atraumatic without obvious abnormalities. No apparent alopecia or balding. + bilat sinus tenderness maxillary and frontal Eyes:  Conjunctiva clear bilaterally.  Ears:  TMs clear Nose:  nares congested, erythematous Mouth:  MMM, no exudates Neck:  No deformities, masses, or tenderness noted. Lungs:  Normal respiratory effort, chest expands symmetrically. Lungs are clear to auscultation, no crackles or wheezes. Heart:  Normal rate and regular rhythm. S1 and S2 normal without gallop, murmur, click, rub or other extra sounds. Pulses:  2+ rad pulses, brisk cap refill Extremities:  no pedal edema Skin:  Intact without suspicious lesions or rashes except benign moles throughout.   Impression & Recommendations:  Problem # 1:  COUGH (ICD-786.2) sxs consistent with acute sinusitis, going on for 1 mo, treat with 10 day course of amoxicillin.  doubt ACEI-induced, not dry,  correlates with significant sinus drainage.  If amox doesnt clear up cough, consider allergic cough vs GERD.  advised to return as needed.  Complete Medication List: 1)  Metformin Hcl 500 Mg Tabs (Metformin hcl) .... One tab by mouth two times a day 2)  Monodox 100 Mg Caps (Doxycycline monohydrate) .... One tab by mouth once daily states taking as needed 3)  Fish Oil Concentrate 1000 Mg Caps (Omega-3 fatty acids) .Marland Kitchen.. 1 daily by mouth 4)  Glimepiride 2 Mg Tabs (Glimepiride) .... One tab by mouth two  times a day 5)  Vitamin D 1000 Unit Tabs (Cholecalciferol) .... Take one by mouth daily 6)  Lisinopril 40 Mg Tabs (Lisinopril) .Marland Kitchen.. 1 by mouth once daily 7)  Simvastatin 40 Mg Tabs (Simvastatin) .Marland Kitchen.. 1 by mouth once daily 8)  Hydromet 5-1.5 Mg/63ml Syrp (Hydrocodone-homatropine) .... 5 ml by mouth three times a day as needed for cough with sedation caution 9)  Amoxicillin 875 Mg Tabs (Amoxicillin) .... Take one twice daily for 10 days  Patient Instructions: 1)  This could be a sinus infection. 2)  Take medicines as prescribed:Amoxicillin 875mg  twice daily for 10 days 3)  Take guaifenesin 400mg  IR 1 1/2 pills in am and at noon with plenty of fluid to help mobilize mucous (or robitussin/mucinex). 4)  Use nasal saline spray or neti pot to help drainage of sinuses. 5)  If you start having fevers >101.5, trouble swallowing or breathing, or are worsening instead of improving as expected, you may need to be seen again. 6)  Good to see you today, call clinic with questions.  Prescriptions: AMOXICILLIN 875 MG TABS (AMOXICILLIN) take one twice daily for 10 days  #20 x 0   Entered and Authorized by:   Eustaquio Boyden  MD   Signed by:   Eustaquio Boyden  MD on 02/05/2010   Method used:   [facility] to        [facility]  [address] [phone][facility] (retail)       [address]       [place], [place]  [address]       Ph: [phone]       Fax: [phone]   RxID:   [id number]    Orders Added: 1)  Est. Patient Level III [43329]    Current Allergies (reviewed today): No known allergies

## 2010-04-12 NOTE — Assessment & Plan Note (Signed)
Summary: SINUS INFECTION/RBH   Vital Signs:  Patient profile:   60 year old female Weight:      185 pounds Temp:     98.8 degrees F oral BP sitting:   130 / 90  (left arm) Cuff size:   regular  Vitals Entered By: Mervin Hack CMA Duncan Dull) (March 06, 2010 8:41 AM) CC: sinus, Hypertension Management   History of Present Illness: Feels that she has another sinus infection recent infection  nasal congestion, PND, cough, feels sick started again about 3 days ago  treated 1 month ago and did seem to be resolved  No sore throat No otalgia No fever No chills but has sweat at night No SOB  Wonders about her workplace exposures Works at [employer] and wonders about exposures to customers and coworkers  Changes filters at home every month No recent allergy problems  Tired mucinex, Rx cough med still left  Hypertension History:      Positive major cardiovascular risk factors include female age 80 years old or older, diabetes, hyperlipidemia, and hypertension.  Negative major cardiovascular risk factors include non-tobacco-user status.    Allergies: No Known Drug Allergies  Past History:  Past medical, surgical, family and social histories (including risk factors) reviewed for relevance to current acute and chronic problems.  Past Medical History: Reviewed history from 12/15/2009 and no changes required. Allergic rhinitis Anxiety Diabetes mellitus, type II Hypertension HLD  Past Surgical History: Reviewed history from 12/15/2009 and no changes required. NSVD x 2 TAH due to fibroids  01/2005 Colonoscopy 2010 Nml (Dr Bluford Kaufmann)   10 yrs  Family History: Reviewed history from 12/15/2009 and no changes required. Father: Unknown Mother: Dec 20's from leukemia when the patient was 81 years old Brother A   DM  Social History: Reviewed history from 12/15/2009 and no changes required. Marital Status: Married, 1972 Children: 2, out of the home Occupation:  works at  [employer] minimal exercise no tob  no alcohol   Review of Systems       No vomiting or diarrhea appetite is okay  Physical Exam  General:  alert.  NAD Head:  no maxillary or frontal tenderness Ears:  R ear normal and L ear normal.   Nose:  moderate swelling with mild inflammation bilat Mouth:  no erythema and no exudates.   Neck:  supple, no masses, and no cervical lymphadenopathy.   Lungs:  normal respiratory effort, no intercostal retractions, no accessory muscle use, normal breath sounds, no crackles, and no wheezes.     Impression & Recommendations:  Problem # 1:  SINUSITIS - ACUTE-NOS (ICD-461.9) Assessment New recurrence of sinus symptoms but may still be viral discussed supportive care if worsens, will have her take augmentin  Her updated medication list for this problem includes:    Hydromet 5-1.5 Mg/51ml Syrp (Hydrocodone-homatropine) .Marland Kitchen... 1-2 teaspoons by mouth at bedtime as needed for severe cough    Amoxicillin-pot Clavulanate 875-125 Mg Tabs (Amoxicillin-pot clavulanate) .Marland Kitchen... 1 tab by mouth two times a day with food for sinus infection  Complete Medication List: 1)  Metformin Hcl 500 Mg Tabs (Metformin hcl) .... One tab by mouth two times a day 2)  Glimepiride 2 Mg Tabs (Glimepiride) .... One tab by mouth two times a day 3)  Lisinopril 40 Mg Tabs (Lisinopril) .Marland Kitchen.. 1 by mouth once daily 4)  Simvastatin 40 Mg Tabs (Simvastatin) .Marland Kitchen.. 1 by mouth once daily 5)  Hydromet 5-1.5 Mg/38ml Syrp (Hydrocodone-homatropine) .Marland Kitchen.. 1-2 teaspoons by mouth at bedtime as needed  for severe cough 6)  Fish Oil Concentrate 1000 Mg Caps (Omega-3 fatty acids) .Marland Kitchen.. 1 daily by mouth 7)  Vitamin D 1000 Unit Tabs (Cholecalciferol) .... Take one by mouth daily 8)  Amoxicillin-pot Clavulanate 875-125 Mg Tabs (Amoxicillin-pot clavulanate) .Marland Kitchen.. 1 tab by mouth two times a day with food for sinus infection  Hypertension Assessment/Plan:      The patient's hypertensive risk group is category C: Target  organ damage and/or diabetes.  Her calculated 10 year risk of coronary heart disease is 20 %.  Today's blood pressure is 130/90.     Patient Instructions: 1)  Please start the augmentin antibiotic if you worsen or are not improved within the next week 2)  Please keep your regular follow up Prescriptions: AMOXICILLIN-POT CLAVULANATE 875-125 MG TABS (AMOXICILLIN-POT CLAVULANATE) 1 tab by mouth two times a day with food for sinus infection  #20 x 0   Entered and Authorized by:   Cindee Salt MD   Signed by:   Cindee Salt MD on 03/06/2010   Method used:   Print then Give to Patient   RxID:   [id number] HYDROMET 5-1.5 MG/5ML SYRP (HYDROCODONE-HOMATROPINE) 1-2 teaspoons by mouth at bedtime as needed for severe cough  #8oz x 0   Entered and Authorized by:   Cindee Salt MD   Signed by:   Cindee Salt MD on 03/06/2010   Method used:   Print then Give to Patient   RxID:   [id number]      Current Allergies (reviewed today): No known allergies

## 2010-04-12 NOTE — Assessment & Plan Note (Signed)
Summary: UTI/ALC   Vital Signs:  Patient profile:   60 year old female Height:      64 inches Weight:      184 pounds BMI:     31.70 Temp:     98.5 degrees F oral Pulse rate:   72 / minute Pulse rhythm:   regular BP sitting:   130 / 90  (left arm) Cuff size:   regular  Vitals Entered By: Linde Gillis CMA Duncan Dull) (February 23, 2010 2:35 PM) CC: ? UTI   History of Present Illness: 60 yo here for ? UTI.  Just treated for sinusitis with amoxicillin. Two days ago, noticed increased urinary frequency, dysuria, suprapubic pain. Last night, some back pain and nausea. No fevers.   No visible hematuria.   Current Medications (verified): 1)  Metformin Hcl 500 Mg Tabs (Metformin Hcl) .... One Tab By Mouth Two Times A Day 2)  Fish Oil Concentrate 1000 Mg  Caps (Omega-3 Fatty Acids) .Marland Kitchen.. 1 Daily By Mouth 3)  Glimepiride 2 Mg Tabs (Glimepiride) .... One Tab By Mouth Two Times A Day 4)  Vitamin D 1000 Unit Tabs (Cholecalciferol) .... Take One By Mouth Daily 5)  Lisinopril 40 Mg Tabs (Lisinopril) .Marland Kitchen.. 1 By Mouth Once Daily 6)  Simvastatin 40 Mg Tabs (Simvastatin) .Marland Kitchen.. 1 By Mouth Once Daily 7)  Cipro 500 Mg Tabs (Ciprofloxacin Hcl) .Marland Kitchen.. 1 By Mouth 2 Times Daily X 7 Days 8)  Pyridium 100 Mg Tabs (Phenazopyridine Hcl) .Marland Kitchen.. 1 Tab By Mouth Three Times A Day After Meals X 2 Days 9)  Hydromet 5-1.5 Mg/68ml Syrp (Hydrocodone-Homatropine) .... 5  Ml By Mouth Three Times A Day As Needed Cough With Sedation Caution  Allergies (verified): No Known Drug Allergies  Past History:  Past Medical History: Last updated: 12/15/2009 Allergic rhinitis Anxiety Diabetes mellitus, type II Hypertension HLD  Past Surgical History: Last updated: 12/15/2009 NSVD x 2 TAH due to fibroids  01/2005 Colonoscopy 2010 Nml (Dr Bluford Kaufmann)   10 yrs  Family History: Last updated: 12/15/2009 Father: Unknown Mother: Dec 20's from leukemia when the patient was 36 years old Brother A   DM  Social History: Last updated:  12/15/2009 Marital Status: Married, 1972 Children: 2, out of the home Occupation:  works at [employer] minimal exercise no tob  no alcohol   Risk Factors: Alcohol Use: 0 (10/13/2008) Caffeine Use: 1 (10/13/2008) Exercise: yes (10/13/2008)  Risk Factors: Smoking Status: never (10/13/2008) Passive Smoke Exposure: no (10/13/2008)  Review of Systems      See HPI General:  Denies fever. GI:  Complains of nausea; denies vomiting. GU:  Complains of dysuria and urinary frequency; denies hematuria, incontinence, and nocturia.  Physical Exam  General:  NAD, WDWN VSS, non toxic appearing Mouth:  MMM Abdomen:  soft, Pos suprapubic tenderness, NO CVA tenderness Psych:  Cognition and judgment appear intact. Alert and cooperative with normal attention span and concentration. No apparent delusions, illusions, hallucinations   Impression & Recommendations:  Problem # 1:  DYSURIA (ICD-788.1) Assessment New UA pos. Will treat for complicated cystitis given recent abx treatment along with malaise, nausea and back pain. Cipro 500 mg two times a day x 7 days. Pyridium as instructions for 2 days. The following medications were removed from the medication list:    Monodox 100 Mg Caps (Doxycycline monohydrate) ..... One tab by mouth once daily states taking as needed    Amoxicillin 875 Mg Tabs (Amoxicillin) .Marland Kitchen... Take one twice daily for 10 days Her updated medication list  for this problem includes:    Cipro 500 Mg Tabs (Ciprofloxacin hcl) .Marland Kitchen... 1 by mouth 2 times daily x 7 days    Pyridium 100 Mg Tabs (Phenazopyridine hcl) .Marland Kitchen... 1 tab by mouth three times a day after meals x 2 days  Orders: UA Dipstick w/o Micro (manual) (11914) T-Culture, Urine ([id number])  Complete Medication List: 1)  Metformin Hcl 500 Mg Tabs (Metformin hcl) .... One tab by mouth two times a day 2)  Fish Oil Concentrate 1000 Mg Caps (Omega-3 fatty acids) .Marland Kitchen.. 1 daily by mouth 3)  Glimepiride 2 Mg Tabs (Glimepiride)  .... One tab by mouth two times a day 4)  Vitamin D 1000 Unit Tabs (Cholecalciferol) .... Take one by mouth daily 5)  Lisinopril 40 Mg Tabs (Lisinopril) .Marland Kitchen.. 1 by mouth once daily 6)  Simvastatin 40 Mg Tabs (Simvastatin) .Marland Kitchen.. 1 by mouth once daily 7)  Cipro 500 Mg Tabs (Ciprofloxacin hcl) .Marland Kitchen.. 1 by mouth 2 times daily x 7 days 8)  Pyridium 100 Mg Tabs (Phenazopyridine hcl) .Marland Kitchen.. 1 tab by mouth three times a day after meals x 2 days 9)  Hydromet 5-1.5 Mg/61ml Syrp (Hydrocodone-homatropine) .... 5  ml by mouth three times a day as needed cough with sedation caution  Patient Instructions: 1)  Drink plenty of fluids up to 3-4 quarts a day. Cranberry juice is especially recommended in addition to large amounts of water. Avoid caffeine & carbonated drinks, they tend to irritate the bladder, Return in 3-5 days if you're not better: sooner if you're feeling worse.  Prescriptions: HYDROMET 5-1.5 MG/5ML SYRP (HYDROCODONE-HOMATROPINE) 5  ml by mouth three times a day as needed cough with sedation caution  #5 ounces x 0   Entered and Authorized by:   Ruthe Mannan MD   Signed by:   Ruthe Mannan MD on 02/23/2010   Method used:   Print then Give to Patient   RxID:   [id number] PYRIDIUM 100 MG TABS (PHENAZOPYRIDINE HCL) 1 tab by mouth three times a day after meals x 2 days  #6 x 0   Entered and Authorized by:   Ruthe Mannan MD   Signed by:   Ruthe Mannan MD on 02/23/2010   Method used:   [facility] to        [facility] [phone]* (retail)       [address]       [place], [place]  [address]       Ph: [phone]       Fax: [phone]   RxID:   [id number] CIPRO 500 MG TABS (CIPROFLOXACIN HCL) 1 by mouth 2 times daily x 7 days  #14 x 0   Entered and Authorized by:   Ruthe Mannan MD   Signed by:   Ruthe Mannan MD on 02/23/2010   Method used:   Electronically to        [facility] [phone]* (retail)       [address]       [place], [place]  [address]       Ph:  [phone]       Fax: [phone]   RxID:   [id number]    Orders Added: 1)  UA Dipstick w/o Micro (manual) [81002] 2)  T-Culture, Urine [[id number]] 3)  Est. Patient Level IV [27035]    Current Allergies (reviewed today): No known allergies  Laboratory Results   Urine Tests  Date/Time Received: February 23, 2010 2:45 PM   Routine Urinalysis   Color: yellow Appearance: Cloudy Glucose: negative   (Normal Range: Negative) Bilirubin: negative   (Normal Range: Negative) Ketone: negative   (Normal Range: Negative) Spec. Gravity: >=1.030   (Normal Range: 1.003-1.035) Blood: large   (Normal Range: Negative) pH: 5.0   (Normal Range: 5.0-8.0) Protein: negative   (Normal Range: Negative) Urobilinogen: 0.2   (Normal Range: 0-1) Nitrite: negative   (Normal Range: Negative) Leukocyte Esterace: trace   (Normal Range: Negative)        Appended Document: UTI/ALC

## 2010-04-18 NOTE — Assessment & Plan Note (Signed)
Summary: cold symptoms/alc   Vital Signs:  Patient profile:   60 year old female Height:      64 inches Weight:      187.50 pounds BMI:     32.30 Temp:     98.2 degrees F oral Pulse rate:   80 / minute Pulse rhythm:   regular BP sitting:   142 / 96  (left arm) Cuff size:   regular  Vitals Entered By: Delilah Shan CMA (AAMA) (April 09, 2010 3:00 PM) CC: Cold sx.     History of Present Illness: She got a little better after seeing [place] and taking augmentin, but the cough is persisent.  No fevers.  Facial congestions and eye watering.  Not tender across the face.  Some postnasal gtt.  Have to use cough drops to keep throat moist and help wth the cough.  No help with otc meds.  She was getting some better on the antibiotics but then the symptoms got worse.  Nonsmoker.  She has coughed to the point of vomiting but no sputum.  The cough syrup helps her sleep at night but she can't use it during the day.  Voice change noted.   Allergies: No Known Drug Allergies  Review of Systems       See HPI.  Otherwise negative.    Physical Exam  General:  GEN: nad, alert and oriented HEENT: mucous membranes moist, TM w/o erythema, nasal epithelium injected, OP with cobblestoning NECK: supple w/o LA CV: rrr. PULM: ctab, no inc wob EXT: no edema  frontal and max sinuses tender to palpation bilaterally   Impression & Recommendations:  Problem # 1:  SINUSITIS - ACUTE-NOS (ICD-461.9) Start zmax and use nasal saline/flonase.  Fu as needed.  She agrees.  She is in no apparent distress and lungs are clear to auscultation bilaterally.  She agrees with plan.  Her updated medication list for this problem includes:    Hydromet 5-1.5 Mg/41ml Syrp (Hydrocodone-homatropine) .Marland Kitchen... 1-2 teaspoons by mouth at bedtime as needed for severe cough    Flonase 50 Mcg/act Susp (Fluticasone propionate) .Marland Kitchen... 2 sprays per nostirl per day    Zithromax 250 Mg Tabs (Azithromycin) .Marland Kitchen... 2 by mouth today and then 1 by  mouth once daily for 4 days.  Orders: Prescription Created Electronically [id number])  Complete Medication List: 1)  Metformin Hcl 500 Mg Tabs (Metformin hcl) .... One tab by mouth two times a day 2)  Glimepiride 2 Mg Tabs (Glimepiride) .... One tab by mouth two times a day 3)  Lisinopril 40 Mg Tabs (Lisinopril) .Marland Kitchen.. 1 by mouth once daily 4)  Simvastatin 40 Mg Tabs (Simvastatin) .Marland Kitchen.. 1 by mouth once daily 5)  Hydromet 5-1.5 Mg/64ml Syrp (Hydrocodone-homatropine) .Marland Kitchen.. 1-2 teaspoons by mouth at bedtime as needed for severe cough 6)  Fish Oil Concentrate 1000 Mg Caps (Omega-3 fatty acids) .Marland Kitchen.. 1 daily by mouth 7)  Vitamin D 1000 Unit Tabs (Cholecalciferol) .... Take one by mouth daily 8)  Flonase 50 Mcg/act Susp (Fluticasone propionate) .... 2 sprays per nostirl per day 9)  Zithromax 250 Mg Tabs (Azithromycin) .... 2 by mouth today and then 1 by mouth once daily for 4 days.  Patient Instructions: 1)  Get plenty of rest, drink lots of clear liquids, and use Tylenol or Ibuprofen for fever and comfort.  Start the antibiotics today and use the nasal steroid- fluticasone- 2 sprays per nostril per day.  You can also use nasal saline for congestion.  Take care.  Prescriptions:  ZITHROMAX 250 MG TABS (AZITHROMYCIN) 2 by mouth today and then 1 by mouth once daily for 4 days.  #6 x 0   Entered and Authorized by:   Crawford Givens MD   Signed by:   Crawford Givens MD on 04/09/2010   Method used:   [facility]        [facility] [phone][facility] (retail)       [address]       [place], [place]  [address]       Ph: [phone]       Fax: [phone]   RxID:   [id number] FLONASE 50 MCG/ACT SUSP (FLUTICASONE PROPIONATE) 2 sprays per nostirl per day  #1 x 1   Entered and Authorized by:   Crawford Givens MD   Signed by:   Crawford Givens MD on 04/09/2010   Method used:   [facility]        [facility]  [address] [facility] [phone]* (retail)       [address]       [place], [place]  [address]        Ph: [phone]       Fax: [phone]   RxID:   [id number]    Orders Added: 1)  Prescription Created Electronically [phone] 2)  Est. Patient Level III [55732]    Current Allergies (reviewed today): No known allergies

## 2010-05-16 ENCOUNTER — Encounter: Payer: Self-pay | Admitting: Family Medicine

## 2010-06-11 ENCOUNTER — Ambulatory Visit (INDEPENDENT_AMBULATORY_CARE_PROVIDER_SITE_OTHER): Payer: Self-pay | Admitting: Family Medicine

## 2010-06-11 ENCOUNTER — Encounter: Payer: Self-pay | Admitting: Family Medicine

## 2010-06-11 DIAGNOSIS — J309 Allergic rhinitis, unspecified: Secondary | ICD-10-CM

## 2010-06-11 NOTE — Progress Notes (Signed)
Post nasal drip.  Worse supine.  Vomiting after cough- clear sputum.  Sx predated the troubles below.  She was sick 11/11-1/12 but this had resolved.  She is working swing shift and has a dust exposure.  No fevers.  She feels stuffy across her maxilla.  Occ ear pain/pressure.  Off flonase.  Hasn't been on nasal saline.  She thinks it allergies and I agree based on her history.  She has dust exposure; h/o eye watering.    Grandchild died 2/58, was <43 month old.  "I'm trying to get by."  No SI/HI.   ROS:  See HPI.  Otherwise negative.   GEN: nad, alert and oriented, affect appropriate HEENT: mucous membranes moist, tm w/o erythema, nasal exam w/o erythema, clear discharge noted,  OP with cobblestoning, max/frontal sinuses not ttp NECK: supple w/o LA CV: rrr.   PULM: ctab, no inc wob EXT: no edema SKIN: no acute rash

## 2010-06-11 NOTE — Assessment & Plan Note (Addendum)
4 samples of veramyst given.  1-2 sprays per nostril per day.  Use nasal saline and call back if not improved.   She agrees.  She can take claritin 10mg  a day.  If she is improved but needs to stay on nasal steroids, we can consider switching her back to flonase if needed.  She agrees with the plan.  I offered my support for her family troubles, and she said she appreciated that.

## 2010-06-11 NOTE — Patient Instructions (Signed)
Take 10mg  of claritin/loratidine a day.  Try the veramyst 2 sprays on each nostril for 1 week and then cut back to 1 spray per nostril if you can.  Use nasal saline in the morning.  Let me know if I can I help in the meantime.

## 2010-06-12 ENCOUNTER — Encounter: Payer: Self-pay | Admitting: Family Medicine

## 2010-06-25 ENCOUNTER — Telehealth: Payer: Self-pay | Admitting: *Deleted

## 2010-06-25 ENCOUNTER — Encounter: Payer: Self-pay | Admitting: *Deleted

## 2010-06-25 NOTE — Telephone Encounter (Signed)
Letter printed, please fax to patient.

## 2010-06-25 NOTE — Telephone Encounter (Signed)
Patient is requesting note for work stating that she is allowed to wear tennis shoes on the floor at work, also is to be allowed to keep snack and bottle of water on the floor in case her sugar drops. Patient works for [employer] and this is required yearly. Patient is asking that this be faxed to her home fax. [phone].

## 2010-06-27 ENCOUNTER — Other Ambulatory Visit: Payer: Self-pay | Admitting: *Deleted

## 2010-07-05 ENCOUNTER — Other Ambulatory Visit: Payer: Self-pay

## 2010-07-09 ENCOUNTER — Ambulatory Visit: Payer: Self-pay | Admitting: Family Medicine

## 2010-07-09 ENCOUNTER — Other Ambulatory Visit: Payer: 59

## 2010-07-10 ENCOUNTER — Other Ambulatory Visit (INDEPENDENT_AMBULATORY_CARE_PROVIDER_SITE_OTHER): Payer: 59 | Admitting: Family Medicine

## 2010-07-10 DIAGNOSIS — E119 Type 2 diabetes mellitus without complications: Secondary | ICD-10-CM

## 2010-07-10 LAB — HEMOGLOBIN A1C: Hgb A1c MFr Bld: 6.6 % — ABNORMAL HIGH (ref 4.6–6.5)

## 2010-07-11 ENCOUNTER — Other Ambulatory Visit: Payer: 59

## 2010-07-13 ENCOUNTER — Encounter: Payer: Self-pay | Admitting: Family Medicine

## 2010-07-13 ENCOUNTER — Ambulatory Visit (INDEPENDENT_AMBULATORY_CARE_PROVIDER_SITE_OTHER): Payer: 59 | Admitting: Family Medicine

## 2010-07-13 VITALS — BP 144/108 | HR 92 | Temp 98.5°F | Wt 182.0 lb

## 2010-07-13 DIAGNOSIS — I10 Essential (primary) hypertension: Secondary | ICD-10-CM

## 2010-07-13 DIAGNOSIS — E785 Hyperlipidemia, unspecified: Secondary | ICD-10-CM

## 2010-07-13 DIAGNOSIS — E119 Type 2 diabetes mellitus without complications: Secondary | ICD-10-CM

## 2010-07-13 MED ORDER — LOSARTAN POTASSIUM 50 MG PO TABS: 50.0000 mg | ORAL_TABLET | Freq: Every day | ORAL | Status: DC

## 2010-07-13 NOTE — Assessment & Plan Note (Addendum)
Controlled, continue to work on diet/exercise.  Recheck 3 months.  Labs d/w pt.

## 2010-07-13 NOTE — Patient Instructions (Signed)
Stop the lisinopril.  Start taking losartan and let me know about your BP and cough in the next week or two (sooner if needed).   Keep working on [employer] and keep walking. Recheck labs in 3 months with OV a few days later.  Call about getting and eye appointment.  Take care.

## 2010-07-13 NOTE — Assessment & Plan Note (Signed)
Change to ARB and she'll call back with BP readings/update on cough

## 2010-07-13 NOTE — Assessment & Plan Note (Signed)
No change in meds.  Tolerating statin.

## 2010-07-13 NOTE — Progress Notes (Signed)
Going to start on fish oil and vitamin D.   She was asking about DXA.  This isn't indicated now.    Diabetes:  Using medications without difficulties:yes Hypoglycemic episodes: rare Hyperglycemic episodes:no Feet problems: no Blood Sugars averaging: 120-130 in AM. As low as 80 later in the day eye exam within last year: she'll call about scheduling Walking for exercise.  Hypertension:    Using medication without problems or lightheadedness: yes Chest pain with exertion:no Edema:no Short of breath:no Average home BPs: she's going to check on this and call back Other issues: dry cough, occ with some sputum.  No FC.  Occ rhinorrhea.  Cough started about the time of the ACE.    Elevated Cholesterol: Using medications without problems:yes Muscle aches: no Other complaints:no  PMH and SH reviewed.   Vital signs, Meds and allergies reviewed.  ROS: See HPI.  Otherwise nontributory.   GEN: nad, alert and oriented HEENT: mucous membranes moist NECK: supple w/o LA CV: rrr.  PULM: ctab, no inc wob ABD: soft, +bs EXT: no edema SKIN: no acute rash  Diabetic foot exam: Normal inspection No skin breakdown No calluses  Normal DP pulses Normal sensation to light tough and monofilament Nails normal

## 2010-09-12 ENCOUNTER — Other Ambulatory Visit: Payer: Self-pay | Admitting: Family Medicine

## 2010-10-21 ENCOUNTER — Other Ambulatory Visit: Payer: Self-pay | Admitting: Family Medicine

## 2010-10-29 ENCOUNTER — Ambulatory Visit: Payer: 59

## 2010-10-29 ENCOUNTER — Ambulatory Visit: Payer: 59 | Admitting: Family Medicine

## 2010-10-31 ENCOUNTER — Ambulatory Visit: Payer: 59 | Admitting: Family Medicine

## 2010-12-14 ENCOUNTER — Other Ambulatory Visit: Payer: Self-pay | Admitting: Family Medicine

## 2010-12-20 ENCOUNTER — Other Ambulatory Visit (INDEPENDENT_AMBULATORY_CARE_PROVIDER_SITE_OTHER): Payer: 59

## 2010-12-20 DIAGNOSIS — E119 Type 2 diabetes mellitus without complications: Secondary | ICD-10-CM

## 2010-12-20 LAB — HEMOGLOBIN A1C: Hgb A1c MFr Bld: 7.9 % — ABNORMAL HIGH (ref 4.6–6.5)

## 2010-12-25 ENCOUNTER — Ambulatory Visit (INDEPENDENT_AMBULATORY_CARE_PROVIDER_SITE_OTHER): Payer: 59 | Admitting: Family Medicine

## 2010-12-25 ENCOUNTER — Encounter: Payer: Self-pay | Admitting: Family Medicine

## 2010-12-25 VITALS — BP 146/84 | HR 93 | Temp 98.5°F | Wt 188.0 lb

## 2010-12-25 DIAGNOSIS — I10 Essential (primary) hypertension: Secondary | ICD-10-CM

## 2010-12-25 DIAGNOSIS — Z23 Encounter for immunization: Secondary | ICD-10-CM

## 2010-12-25 DIAGNOSIS — E119 Type 2 diabetes mellitus without complications: Secondary | ICD-10-CM

## 2010-12-25 DIAGNOSIS — M722 Plantar fascial fibromatosis: Secondary | ICD-10-CM

## 2010-12-25 NOTE — Assessment & Plan Note (Signed)
Okay on outside checks, she'll monitor.  No change in meds yet.

## 2010-12-25 NOTE — Assessment & Plan Note (Signed)
D/w pt about arch support and stretching.  F/u prn.  She agrees.

## 2010-12-25 NOTE — Assessment & Plan Note (Signed)
Worsened, likely related to social factors.  Pt will work on diet/exercise.  Recheck 3 months before CPE.  No change in meds.  She agrees with plan.

## 2010-12-25 NOTE — Patient Instructions (Addendum)
Take care, try to work on your diet, and recheck labs in 3 months before a physical.  Let me know if your BP stays up in the meantime. Glad to see you.

## 2010-12-25 NOTE — Progress Notes (Signed)
Diabetes:  Using medications without difficulties:yes Hypoglycemic episodes: no Hyperglycemic episodes:  Feet problems: some stinging in toes, h/o plantar fasciitis (we talked about stretching for this) Blood Sugars averaging: up to the lower 200s eye exam within last year: yes A1c up from last check, now 7.9.   Hypertension:    Cough much improved after change from ACE.   Using medication without problems or lightheadedness: yes Chest pain with exertion:no Edema:no Short of breath:no BP 120s/70s on home check. Started back walking yesterday.   Sleep is 'okay.  She has a swing shift at work and this makes it difficult for her to have a routine.  She had trouble with diet compliance in the meantime (esp with work) and she's pushing herself with work.  Her grandchild died April 25, 2022 and there has been a lot of upheaval socially .  Plantar fasciitis.  Pain with first step.  Pain at fascia origin.  Dec in pain as day goes on.  On her feet most of the day at work.   PMH and SH reviewed  Meds, vitals, and allergies reviewed.   ROS: See HPI.  Otherwise negative.    GEN: nad, alert and oriented HEENT: mucous membranes moist NECK: supple w/o LA CV: rrr. PULM: ctab, no inc wob ABD: soft, +bs EXT: no edema SKIN: no acute rash  Diabetic foot exam: Normal inspection No skin breakdown No calluses  Normal DP pulses Normal sensation to light touch and monofilament Nails normal L plantar fascia ttp

## 2011-02-27 ENCOUNTER — Telehealth: Payer: Self-pay | Admitting: *Deleted

## 2011-02-27 NOTE — Telephone Encounter (Signed)
Form for diabetic testing supplies in your in box.  I have contacted the patient and she does wish to use this company.

## 2011-02-28 NOTE — Telephone Encounter (Signed)
Filled out, in my outbox. 

## 2011-03-08 ENCOUNTER — Other Ambulatory Visit: Payer: Self-pay | Admitting: Family Medicine

## 2011-03-21 ENCOUNTER — Other Ambulatory Visit (INDEPENDENT_AMBULATORY_CARE_PROVIDER_SITE_OTHER): Payer: 59

## 2011-03-21 DIAGNOSIS — E119 Type 2 diabetes mellitus without complications: Secondary | ICD-10-CM

## 2011-03-21 LAB — LIPID PANEL
Cholesterol: 171 mg/dL (ref 0–200)
LDL Cholesterol: 93 mg/dL (ref 0–99)
Total CHOL/HDL Ratio: 4
VLDL: 35.2 mg/dL (ref 0.0–40.0)

## 2011-03-21 LAB — COMPREHENSIVE METABOLIC PANEL
ALT: 89 U/L — ABNORMAL HIGH (ref 0–35)
AST: 92 U/L — ABNORMAL HIGH (ref 0–37)
Albumin: 4.3 g/dL (ref 3.5–5.2)
Alkaline Phosphatase: 80 U/L (ref 39–117)
BUN: 15 mg/dL (ref 6–23)
Potassium: 4 mEq/L (ref 3.5–5.1)

## 2011-03-28 ENCOUNTER — Ambulatory Visit (INDEPENDENT_AMBULATORY_CARE_PROVIDER_SITE_OTHER): Payer: 59 | Admitting: Family Medicine

## 2011-03-28 ENCOUNTER — Encounter: Payer: Self-pay | Admitting: Family Medicine

## 2011-03-28 VITALS — BP 134/84 | HR 80 | Temp 98.3°F | Wt 185.0 lb

## 2011-03-28 DIAGNOSIS — M25512 Pain in left shoulder: Secondary | ICD-10-CM

## 2011-03-28 DIAGNOSIS — M25519 Pain in unspecified shoulder: Secondary | ICD-10-CM

## 2011-03-28 DIAGNOSIS — Z0000 Encounter for general adult medical examination without abnormal findings: Secondary | ICD-10-CM

## 2011-03-28 DIAGNOSIS — E119 Type 2 diabetes mellitus without complications: Secondary | ICD-10-CM

## 2011-03-28 DIAGNOSIS — J329 Chronic sinusitis, unspecified: Secondary | ICD-10-CM

## 2011-03-28 DIAGNOSIS — E785 Hyperlipidemia, unspecified: Secondary | ICD-10-CM

## 2011-03-28 MED ORDER — TRAMADOL HCL 50 MG PO TABS: 50.0000 mg | ORAL_TABLET | Freq: Three times a day (TID) | ORAL | Status: AC | PRN

## 2011-03-28 MED ORDER — AMOXICILLIN 875 MG PO TABS: 875.0000 mg | ORAL_TABLET | Freq: Two times a day (BID) | ORAL | Status: AC

## 2011-03-28 MED ORDER — FLUTICASONE PROPIONATE 50 MCG/ACT NA SUSP: NASAL | Status: DC

## 2011-03-28 MED ORDER — METFORMIN HCL 500 MG PO TABS: ORAL_TABLET | ORAL | Status: DC

## 2011-03-28 NOTE — Patient Instructions (Signed)
Use the tramadol for pain.  It can make you drowsy.  Use the shoulder exercises.  Call me if not better.  Recheck labs in 4 months with OV a few days later.  Increase the metformin up to 4 tabs a day, if tolerated.   Use the nasal spray and the amoxil for the sinus symptoms.

## 2011-03-28 NOTE — Progress Notes (Signed)
CPE- See plan.  Routine anticipatory guidance given to patient.  See health maintenance.  Diabetes:  Using medications without difficulties:yes Hypoglycemic episodes: rare, with prolonged fasting Hyperglycemic episodes:no Feet problems:not except for plantar fascia sx Blood Sugars averaging: ~150 eye exam within last year: due, she'll call about this. A1c 7.8.  On 1g metformin daily.    Elevated Cholesterol: Using medications without problems:yes Muscle aches: as above, but likely not due to statin Diet compliance: "not good" Exercise: at work, limited o/w  Sinus sx, doing on for a few weeks.  Her since of smell if off, facial pain, sore across max sinuses.  No fevers.  Staying about the same.    L arm pain, lots of folding at work.  L posterior shoulder near the scapula.  Sx are not acute.  Pain at night and aleve doesn't help.    PMH and SH reviewed  Meds, vitals, and allergies reviewed.   ROS: See HPI.  Otherwise negative.    GEN: nad, alert and oriented HEENT: mucous membranes moist, max sinus ttp, tm wnl x2, op wnl NECK: supple w/o LA CV: rrr. PULM: ctab, no inc wob ABD: soft, +bs EXT: no edema SKIN: no acute rash L shoulder with normal rom but + impingement, pain with int/ext rotation.  + supraspinatus weakness.  +scap assist.  No arm drop  Diabetic foot exam: Normal inspection No skin breakdown No calluses  Normal DP pulses Normal sensation to light touch and monofilament Nails normal

## 2011-03-29 ENCOUNTER — Encounter: Payer: Self-pay | Admitting: Family Medicine

## 2011-03-29 DIAGNOSIS — Z0000 Encounter for general adult medical examination without abnormal findings: Secondary | ICD-10-CM | POA: Insufficient documentation

## 2011-03-29 DIAGNOSIS — M25512 Pain in left shoulder: Secondary | ICD-10-CM | POA: Insufficient documentation

## 2011-03-29 DIAGNOSIS — J329 Chronic sinusitis, unspecified: Secondary | ICD-10-CM | POA: Insufficient documentation

## 2011-03-29 NOTE — Assessment & Plan Note (Signed)
She'll call about mammogram.  Diet/exercise d/w pt.  Shots up to date.  S/p colonoscopy 2010.

## 2011-03-29 NOTE — Assessment & Plan Note (Signed)
With cuff sx.  D/w pt about anatomy and home exercises.  Call back if not improved. Handout given.

## 2011-03-29 NOTE — Assessment & Plan Note (Signed)
Cont current meds, work on diet, weight. She understood

## 2011-03-29 NOTE — Assessment & Plan Note (Signed)
Likely due to fatty liver, recheck later in 2013

## 2011-03-29 NOTE — Assessment & Plan Note (Signed)
Amoxil, flonase and f/u prn. Supportive tx o/w

## 2011-03-29 NOTE — Assessment & Plan Note (Signed)
Inc metformin, work on diet and exercise.  Recheck A1c later in 2013

## 2011-04-25 ENCOUNTER — Other Ambulatory Visit: Payer: Self-pay | Admitting: *Deleted

## 2011-04-25 MED ORDER — METFORMIN HCL 500 MG PO TABS: ORAL_TABLET | ORAL | Status: DC

## 2011-04-25 NOTE — Telephone Encounter (Signed)
Requests 90 day Rx.

## 2011-06-04 ENCOUNTER — Ambulatory Visit: Payer: Self-pay | Admitting: Family Medicine

## 2011-06-05 ENCOUNTER — Encounter: Payer: Self-pay | Admitting: Family Medicine

## 2011-06-06 ENCOUNTER — Encounter: Payer: Self-pay | Admitting: *Deleted

## 2011-06-06 ENCOUNTER — Encounter: Payer: Self-pay | Admitting: Family Medicine

## 2011-06-14 ENCOUNTER — Other Ambulatory Visit: Payer: Self-pay

## 2011-06-14 MED ORDER — GLIMEPIRIDE 2 MG PO TABS: 2.0000 mg | ORAL_TABLET | Freq: Two times a day (BID) | ORAL | Status: DC

## 2011-06-14 NOTE — Telephone Encounter (Signed)
Pt request 90 day rx Glimepiride 2 mg sent to [facility]. Glimepiride 2 mg #180 x 3 to [facility]. Pt notified by phone med sent to [facility].

## 2011-07-19 ENCOUNTER — Other Ambulatory Visit: Payer: 59

## 2011-07-21 ENCOUNTER — Other Ambulatory Visit: Payer: Self-pay | Admitting: Family Medicine

## 2011-07-26 ENCOUNTER — Ambulatory Visit: Payer: 59 | Admitting: Family Medicine

## 2011-10-09 ENCOUNTER — Telehealth: Payer: Self-pay

## 2011-10-09 NOTE — Telephone Encounter (Signed)
Spoke with pt; pt is not sure why she was called;  thinks it may be Deborah Alvarado or Deborah Alvarado trying to reach her about a glucose meter. Pt does not have an insulin pump.Pt said she has also gotten calls from Diabetes [facility]oc[facility] About glucose meter. Pt asked Deborah Alvarado to call her at [phone] and if no answer leave detailed message.

## 2011-10-09 NOTE — Telephone Encounter (Signed)
Caller: Deborah Alvarado/Patient; PCP: Deborah Alvarado); CB#: [phone]; ; ; Call regarding States That She Has Missed 2. Calls From [facility].; States that she thinks it is in regards to an insulin pump. States that "they have been telling me they were gonna take care of this for about 2 months and haven't."  EPIC note states that Artelia Laroche has tried to call pt and vice versa. Called office, spoke with Rena, conferenced pt in with Rena.

## 2011-10-09 NOTE — Telephone Encounter (Signed)
Pt left v/m returning Regina's call. I left v/m for pt to call back.

## 2011-10-10 NOTE — Telephone Encounter (Signed)
I'll address the hard copy when possible.

## 2011-10-10 NOTE — Telephone Encounter (Signed)
Patient does want to use this company.  Please complete the form in your in box.

## 2011-12-05 ENCOUNTER — Other Ambulatory Visit: Payer: Self-pay

## 2011-12-05 MED ORDER — LOSARTAN POTASSIUM 50 MG PO TABS: 50.0000 mg | ORAL_TABLET | Freq: Every day | ORAL | Status: DC

## 2011-12-05 NOTE — Telephone Encounter (Signed)
Pt request 90 day rx sent to [facility] due to insurance requiring 90 day rx. Pt notified done.

## 2011-12-23 ENCOUNTER — Other Ambulatory Visit: Payer: Self-pay | Admitting: Family Medicine

## 2012-04-27 IMAGING — MG MAM DGTL SCREENING MAMMO W/CAD
1 series · 4 of 4 positions shown · non-contrast
Comparison: none

REASON FOR EXAM: scr
COMMENTS:

PROCEDURE:     MAM - MAM DGTL SCREENING MAMMO W/CAD  - November 20, 2009  [DATE]
RESULT:
COMPARISONS: 10-31-08 and 10-01-07.

[R CC · right · 4 of 4 slices shown]
[im 1/4]
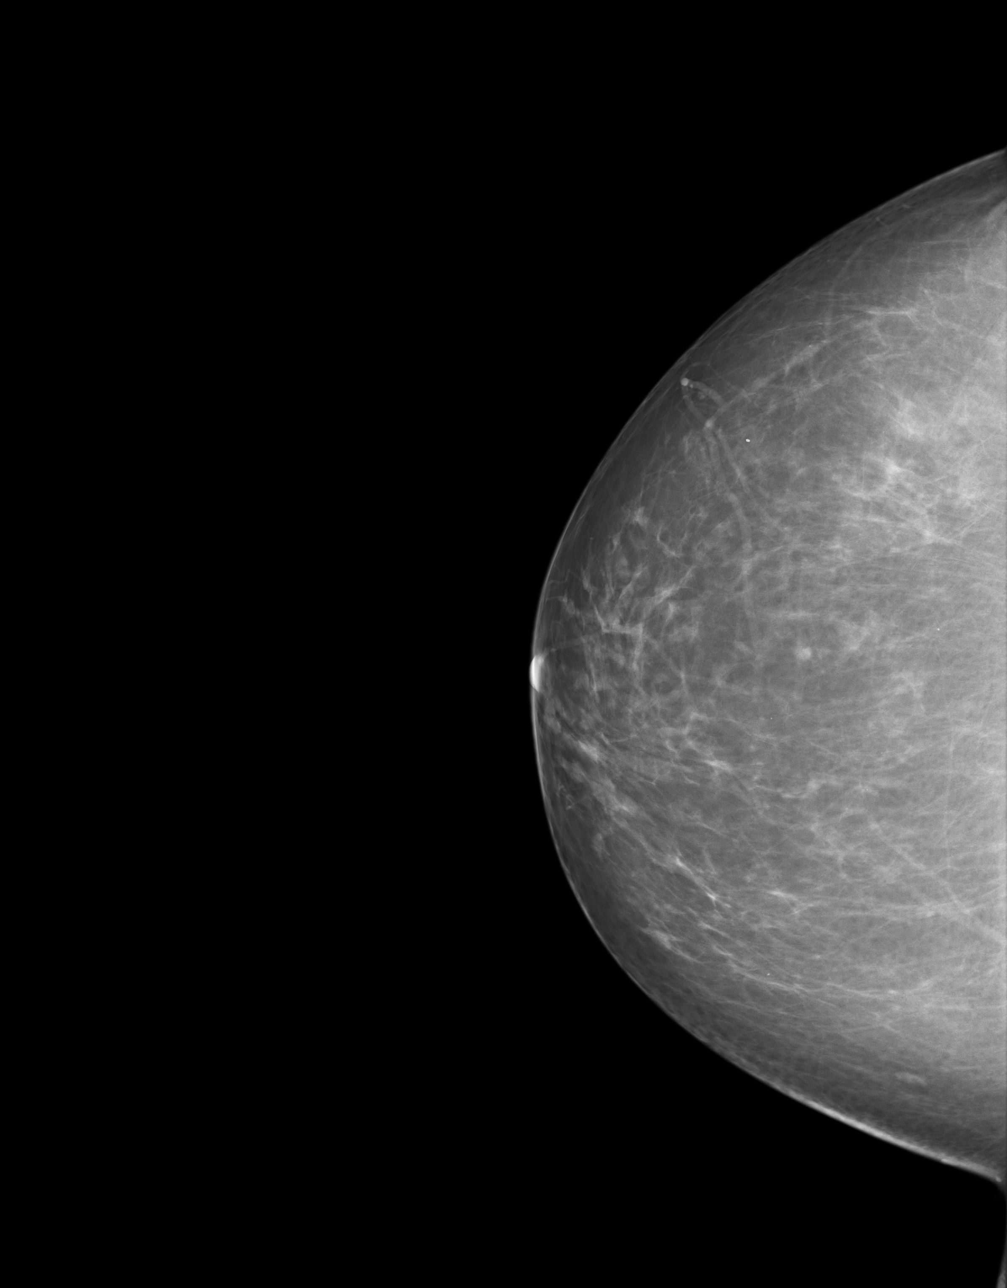
[im 2/4]
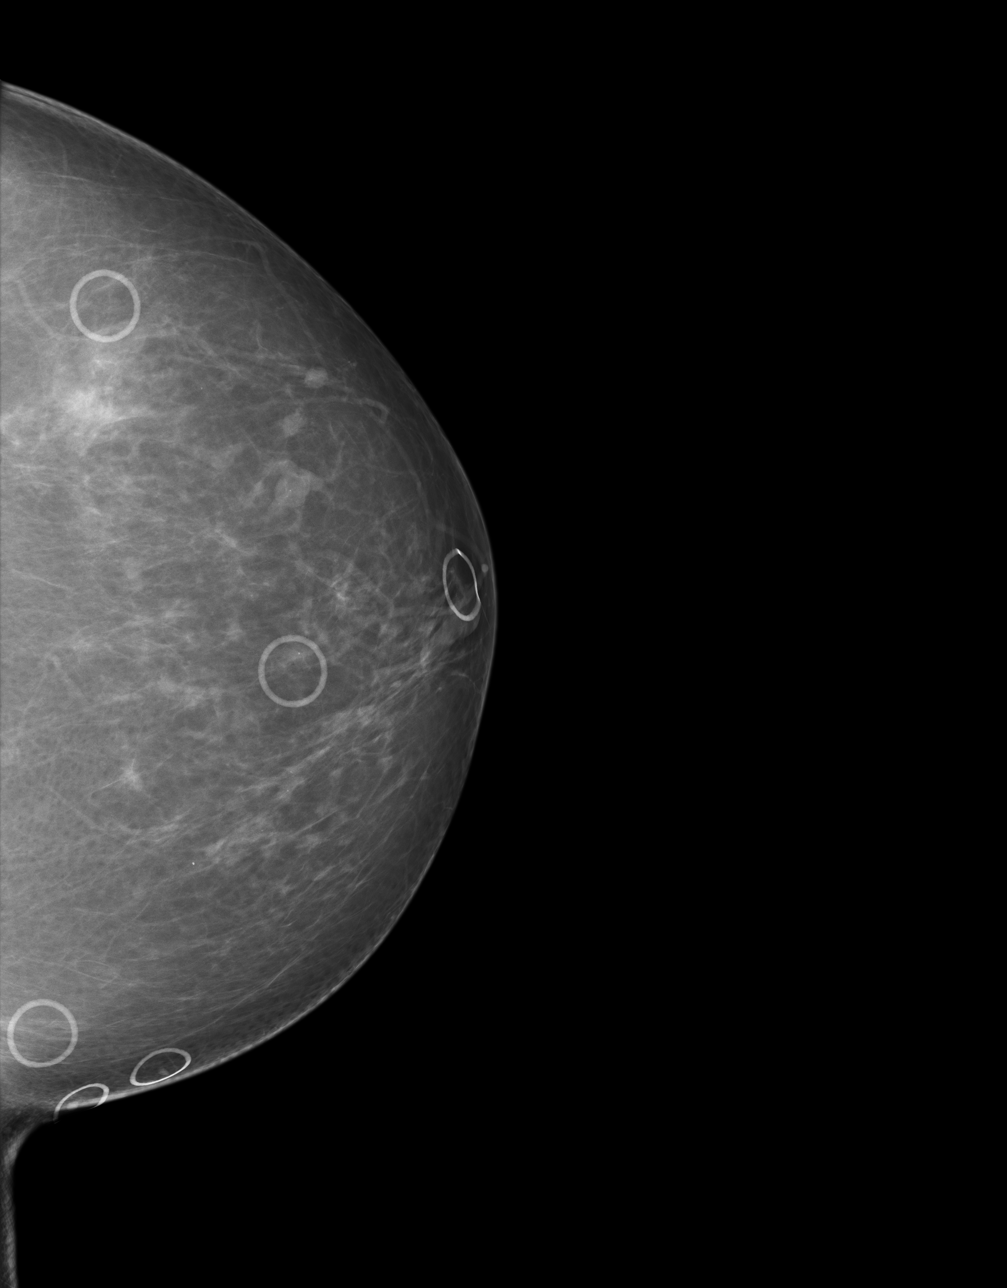
[im 3/4]
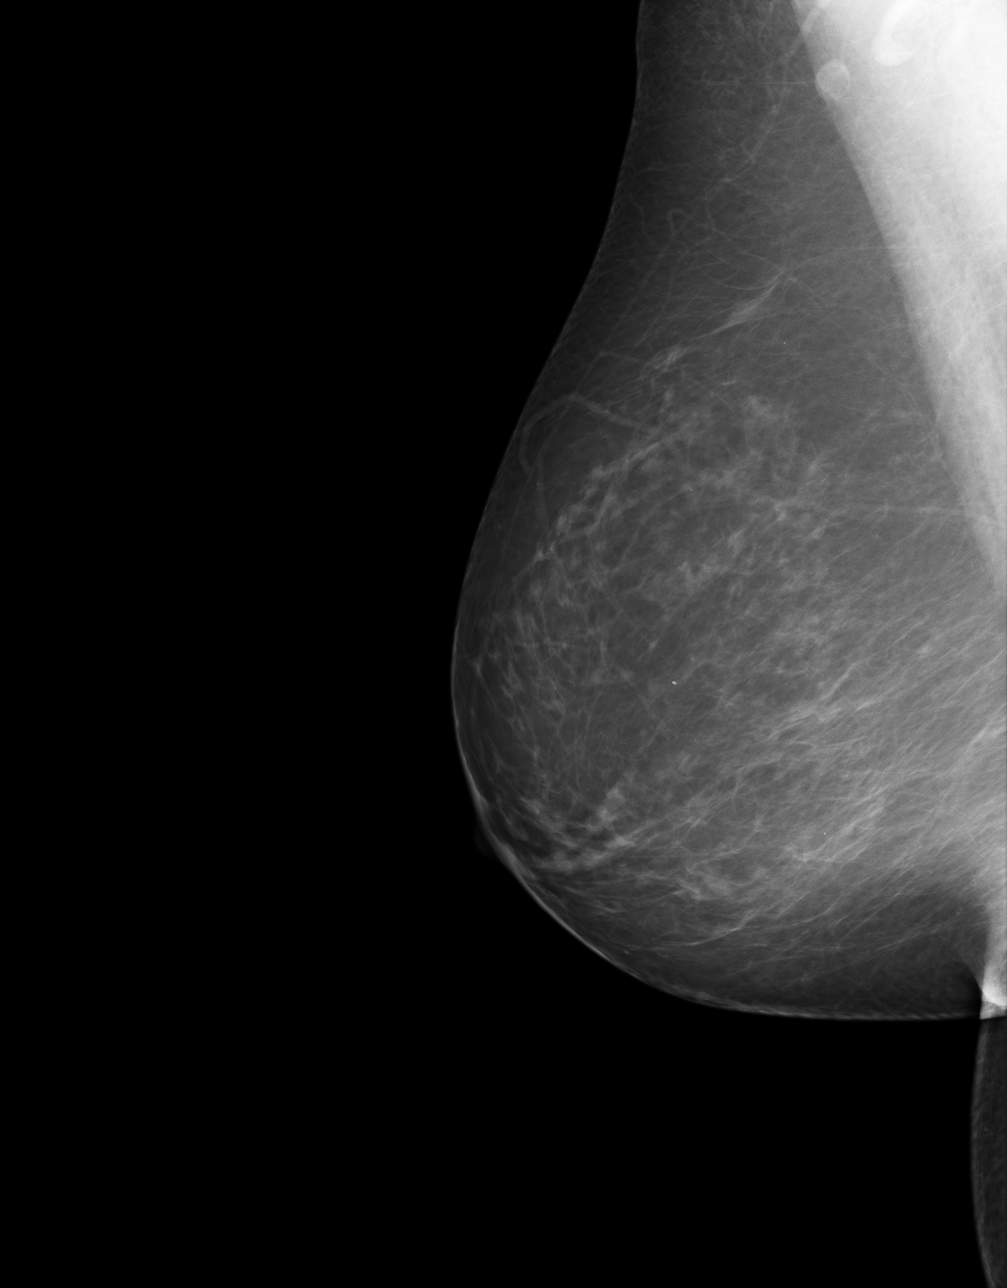
[im 4/4]
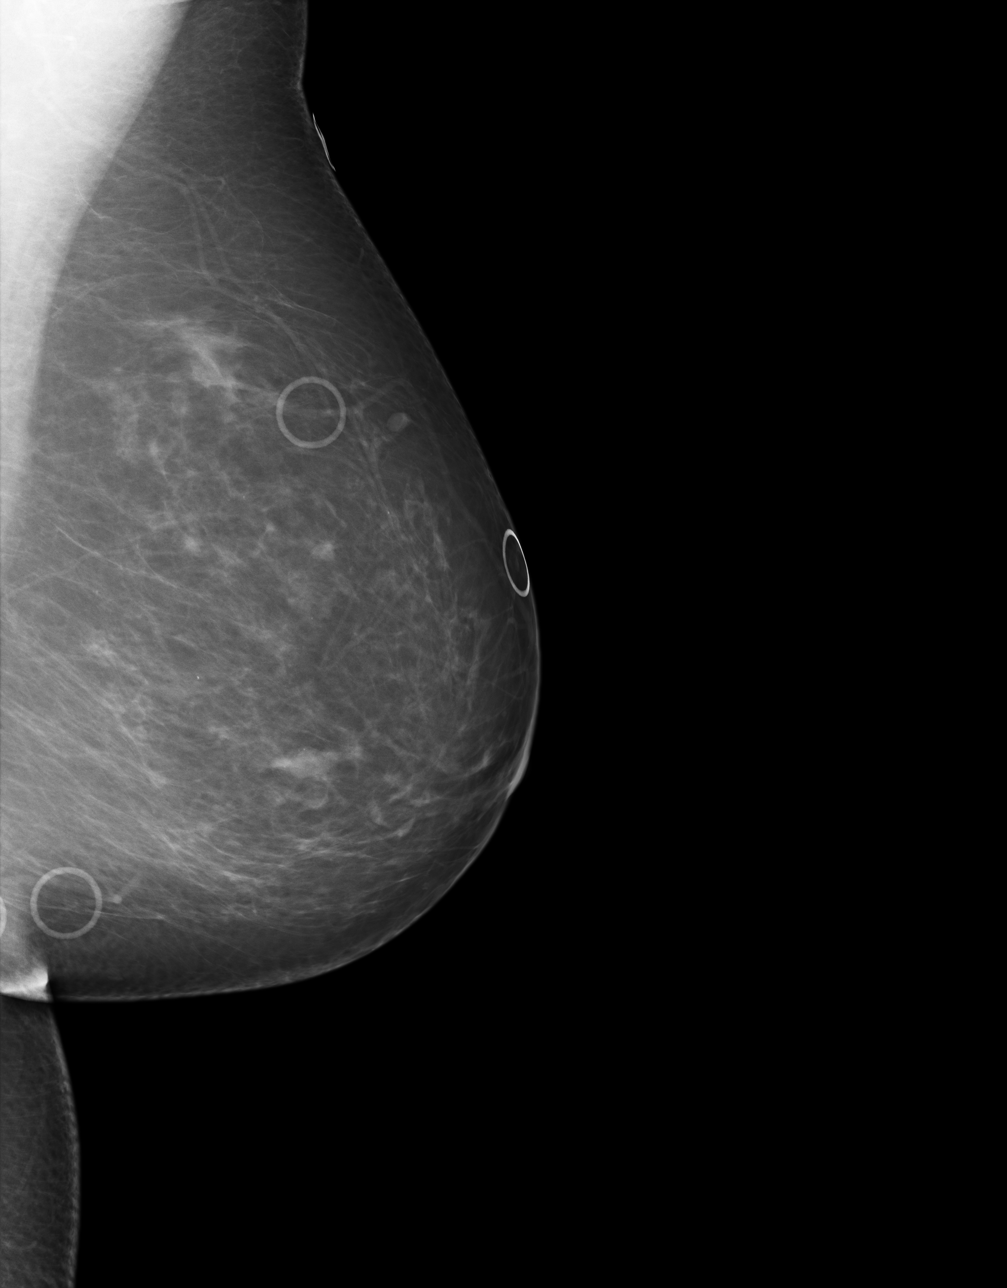

[4 of 4 positions shown; findings below may reference images not displayed]

FINDINGS: There is scattered fibroglandular tissue. Small rounded
asymmetries in the left breast best seen on the CC view are unchanged from
at least 05-22-04.  No new or suspicious masses or calcifications are
identified. No areas of architectural distortion.  Scattered, benign
appearing calcifications are similar to prior.
IMPRESSION: BI-RADS: Category 2 - Benign Findings

Continue annual screening mammography.

A NEGATIVE MAMMOGRAM REPORT DOES NOT PRECLUDE BIOPSY OR OTHER EVALUATION OF
A CLINICALLY PALPABLE OR OTHERWISE SUSPICIOUS MASS OR LESION. BREAST CANCER
MAY NOT BE DETECTED BY MAMMOGRAPHY IN UP TO 10% OF CASES.

## 2012-05-20 ENCOUNTER — Telehealth: Payer: Self-pay | Admitting: Family Medicine

## 2012-05-20 NOTE — Telephone Encounter (Signed)
Letter mailed

## 2012-05-20 NOTE — Telephone Encounter (Signed)
Pt is asking for a letter for work about being able to wear tennis shoes at work. Pt has to updates this letter every year. Best number to contact her at is [phone]

## 2012-05-20 NOTE — Telephone Encounter (Signed)
Pt is asking if she could have the letter mailed to her home address. And she also turned in a form to be filled out from her Insurance. I put the form in your inbox on your desk.

## 2012-05-20 NOTE — Telephone Encounter (Signed)
I'll work on the form.  The letter is done.  Thanks.

## 2012-05-21 ENCOUNTER — Encounter: Payer: Self-pay | Admitting: *Deleted

## 2012-05-21 NOTE — Telephone Encounter (Signed)
Form is done and faxed.  Patient advised and also notified that she needs a 30 min OV with labs before.

## 2012-06-02 ENCOUNTER — Other Ambulatory Visit: Payer: Self-pay | Admitting: Family Medicine

## 2012-07-05 ENCOUNTER — Other Ambulatory Visit: Payer: Self-pay | Admitting: Family Medicine

## 2012-07-05 DIAGNOSIS — E119 Type 2 diabetes mellitus without complications: Secondary | ICD-10-CM

## 2012-07-06 ENCOUNTER — Other Ambulatory Visit: Payer: Self-pay | Admitting: Family Medicine

## 2012-07-10 ENCOUNTER — Other Ambulatory Visit (INDEPENDENT_AMBULATORY_CARE_PROVIDER_SITE_OTHER): Payer: 59

## 2012-07-10 DIAGNOSIS — E119 Type 2 diabetes mellitus without complications: Secondary | ICD-10-CM

## 2012-07-10 LAB — HEMOGLOBIN A1C: Hgb A1c MFr Bld: 6.9 % — ABNORMAL HIGH (ref 4.6–6.5)

## 2012-07-13 LAB — COMPREHENSIVE METABOLIC PANEL
ALT: 45 U/L — ABNORMAL HIGH (ref 0–35)
AST: 46 U/L — ABNORMAL HIGH (ref 0–37)
CO2: 29 mEq/L (ref 19–32)
Calcium: 9.4 mg/dL (ref 8.4–10.5)
Chloride: 104 mEq/L (ref 96–112)
GFR: 65.37 mL/min (ref >60.00)
Potassium: 4.3 mEq/L (ref 3.5–5.1)
Sodium: 140 mEq/L (ref 135–145)
Total Protein: 7.3 g/dL (ref 6.0–8.3)

## 2012-07-13 LAB — LIPID PANEL
Cholesterol: 148 mg/dL (ref 0–200)
HDL: 40.2 mg/dL (ref >39.00)
LDL Cholesterol: 69 mg/dL (ref 0–99)
Total CHOL/HDL Ratio: 4
Triglycerides: 192 mg/dL — ABNORMAL HIGH (ref 0.0–149.0)
VLDL: 38.4 mg/dL (ref 0.0–40.0)

## 2012-07-14 ENCOUNTER — Ambulatory Visit: Payer: Self-pay | Admitting: Family Medicine

## 2012-07-14 ENCOUNTER — Encounter: Payer: Self-pay | Admitting: Family Medicine

## 2012-07-16 ENCOUNTER — Encounter: Payer: Self-pay | Admitting: *Deleted

## 2012-07-17 ENCOUNTER — Ambulatory Visit (INDEPENDENT_AMBULATORY_CARE_PROVIDER_SITE_OTHER): Payer: 59 | Admitting: Family Medicine

## 2012-07-17 ENCOUNTER — Encounter: Payer: Self-pay | Admitting: Family Medicine

## 2012-07-17 VITALS — BP 142/100 | HR 88 | Temp 97.9°F | Ht 64.0 in | Wt 180.5 lb

## 2012-07-17 DIAGNOSIS — E785 Hyperlipidemia, unspecified: Secondary | ICD-10-CM

## 2012-07-17 DIAGNOSIS — E119 Type 2 diabetes mellitus without complications: Secondary | ICD-10-CM

## 2012-07-17 DIAGNOSIS — R109 Unspecified abdominal pain: Secondary | ICD-10-CM

## 2012-07-17 DIAGNOSIS — Z0000 Encounter for general adult medical examination without abnormal findings: Secondary | ICD-10-CM

## 2012-07-17 DIAGNOSIS — I10 Essential (primary) hypertension: Secondary | ICD-10-CM

## 2012-07-17 MED ORDER — LOSARTAN POTASSIUM 50 MG PO TABS: 50.0000 mg | ORAL_TABLET | Freq: Every day | ORAL | Status: DC

## 2012-07-17 MED ORDER — GLIMEPIRIDE 2 MG PO TABS: 2.0000 mg | ORAL_TABLET | Freq: Two times a day (BID) | ORAL | Status: DC

## 2012-07-17 MED ORDER — METFORMIN HCL 500 MG PO TABS: ORAL_TABLET | ORAL | Status: DC

## 2012-07-17 MED ORDER — FLUCONAZOLE 150 MG PO TABS: 150.0000 mg | ORAL_TABLET | Freq: Once | ORAL | Status: DC

## 2012-07-17 MED ORDER — FLUTICASONE PROPIONATE 50 MCG/ACT NA SUSP: NASAL | Status: DC

## 2012-07-17 MED ORDER — SIMVASTATIN 40 MG PO TABS: 40.0000 mg | ORAL_TABLET | Freq: Every day | ORAL | Status: DC

## 2012-07-17 NOTE — Patient Instructions (Addendum)
Check with your insurance to see if they will cover the shingles shot. I would get a flu shot each fall.   Schedule the follow nurse visit for the hepatitis shots after you check with your insurance company.  Recheck labs and then come see me in 6 months.   Take care.  Keep exercising and working on your weight.  Take the diflucan (hold the simvastatin for a few days) and then call gynecology about follow up. Glad to see you.

## 2012-07-17 NOTE — Progress Notes (Signed)
CPE- See plan.  Routine anticipatory guidance given to patient.  See health maintenance. Flu shot done yearly She was asking about getting HAV/HBV shot.  This is reasonable.  Discussed.   Tetanus 2007 Shingles shot discussed.   Living will.  Husband designated if incapacitated.   S/p hysterectomy.  Mammogram up to date.    Her son is notably ill.  She has had some episodic abd sx. She'll have some burning in the abd but not pain o/w.  It's worse when she's upset.  She tired beano with some relief.  No blood in stool, no unintended weight loss.   She has a knot on her external vaginal area, on the R side. Present for about 1 year.  Feels sore.  Recently with vaginitis symptoms.   Diabetes:  Using medications without difficulties: yes Hypoglycemic episodes:no sx Hyperglycemic episodes:no sx Feet problems:no Blood Sugars averaging:not checked.   Hypertension:    Using medication without problems or lightheadedness: yes Chest pain with exertion:no Edema:no Short of breath:no  Elevated Cholesterol: Using medications without problems:yes Muscle aches: no Diet compliance:yes Exercise:yes  PMH and SH reviewed.   Vital signs, Meds and allergies reviewed.  ROS: See HPI.  Otherwise nontributory.   GEN: nad, alert and oriented HEENT: mucous membranes moist NECK: supple w/o LA CV: rrr. PULM: ctab, no inc wob ABD: soft, +bs EXT: no edema SKIN: no acute rash but with ~1cm papule on the R labia noted. No other external lesions Chaperoned exam.    Diabetic foot exam: Normal inspection No skin breakdown No calluses  Normal DP pulses Normal sensation to light tough and monofilament Nails normal

## 2012-07-19 DIAGNOSIS — R109 Unspecified abdominal pain: Secondary | ICD-10-CM | POA: Insufficient documentation

## 2012-07-19 NOTE — Assessment & Plan Note (Signed)
She'll continue to work on weight and we'll follow. Should improve with weight loss.  Continue current meds.

## 2012-07-19 NOTE — Assessment & Plan Note (Signed)
Routine anticipatory guidance given to patient.  See health maintenance. Flu shot done yearly She was asking about getting HAV/HBV shot.  This is reasonable.  Discussed.   Tetanus 2007 Shingles shot discussed.   Living will.  Husband designated if incapacitated.   S/p hysterectomy.  Mammogram up to date.   She'll f/u with gyn about skin lesion.

## 2012-07-19 NOTE — Assessment & Plan Note (Signed)
She'll continue to work on weight and we'll follow. Should continue to improve with weight loss.  Continue current meds.  Labs discussed.

## 2012-07-19 NOTE — Assessment & Plan Note (Signed)
She'll continue to work on weight and we'll follow. Should improve with weight loss.  Continue current meds.  

## 2012-07-19 NOTE — Assessment & Plan Note (Signed)
With benign exam and benefit from OTC meds, likely situational anxiety/stressor causing heartburn/gas sx.  F/u prn.  No alarming sx.  She agrees.

## 2012-07-21 ENCOUNTER — Telehealth: Payer: Self-pay

## 2012-07-21 NOTE — Telephone Encounter (Signed)
Appt scheduled.  Order in Lugene's In Box.

## 2012-07-21 NOTE — Telephone Encounter (Signed)
pts son left v/m requesting pt to be scheduled for shingles vaccine (pt's ins will cover after 07/27/12) and also wants Hep A & B vaccine. Pt contact # [phone].Please advise.

## 2012-07-21 NOTE — Telephone Encounter (Signed)
Please schedule.  Order form signed.  Thanks.

## 2012-07-31 ENCOUNTER — Ambulatory Visit (INDEPENDENT_AMBULATORY_CARE_PROVIDER_SITE_OTHER): Payer: 59 | Admitting: *Deleted

## 2012-07-31 ENCOUNTER — Encounter: Payer: Self-pay | Admitting: *Deleted

## 2012-07-31 DIAGNOSIS — Z23 Encounter for immunization: Secondary | ICD-10-CM

## 2012-07-31 DIAGNOSIS — Z2911 Encounter for prophylactic immunotherapy for respiratory syncytial virus (RSV): Secondary | ICD-10-CM

## 2012-08-31 ENCOUNTER — Telehealth: Payer: Self-pay

## 2012-08-31 ENCOUNTER — Ambulatory Visit (INDEPENDENT_AMBULATORY_CARE_PROVIDER_SITE_OTHER): Payer: 59 | Admitting: *Deleted

## 2012-08-31 DIAGNOSIS — Z23 Encounter for immunization: Secondary | ICD-10-CM

## 2012-08-31 NOTE — Telephone Encounter (Signed)
ptscheduled tomorrow for immunization and has to work; pt request done today; CMA advised OK today at 2 pm; Lyla Son updating schedule.

## 2012-09-01 ENCOUNTER — Ambulatory Visit: Payer: 59

## 2012-11-24 ENCOUNTER — Telehealth: Payer: Self-pay | Admitting: *Deleted

## 2012-11-24 NOTE — Telephone Encounter (Signed)
Erroneous Encounter

## 2012-11-25 ENCOUNTER — Ambulatory Visit (INDEPENDENT_AMBULATORY_CARE_PROVIDER_SITE_OTHER): Payer: 59

## 2012-11-25 DIAGNOSIS — Z23 Encounter for immunization: Secondary | ICD-10-CM

## 2013-01-06 ENCOUNTER — Telehealth: Payer: Self-pay

## 2013-01-06 NOTE — Telephone Encounter (Signed)
Pt getting to the busy time of yr where she works and wanted to know if could get 3rd and final Twinrx when seen on 01/21/13. Pt will ck with Dr Para March at appt and otherwise will schedule final Twinrx.

## 2013-01-18 ENCOUNTER — Other Ambulatory Visit: Payer: 59

## 2013-01-21 ENCOUNTER — Ambulatory Visit (INDEPENDENT_AMBULATORY_CARE_PROVIDER_SITE_OTHER): Payer: 59 | Admitting: Family Medicine

## 2013-01-21 ENCOUNTER — Encounter: Payer: Self-pay | Admitting: Family Medicine

## 2013-01-21 VITALS — BP 152/112 | HR 84 | Temp 97.9°F | Wt 180.5 lb

## 2013-01-21 DIAGNOSIS — R109 Unspecified abdominal pain: Secondary | ICD-10-CM

## 2013-01-21 DIAGNOSIS — E119 Type 2 diabetes mellitus without complications: Secondary | ICD-10-CM

## 2013-01-21 DIAGNOSIS — Z23 Encounter for immunization: Secondary | ICD-10-CM

## 2013-01-21 DIAGNOSIS — R7401 Elevation of levels of liver transaminase levels: Secondary | ICD-10-CM

## 2013-01-21 DIAGNOSIS — I10 Essential (primary) hypertension: Secondary | ICD-10-CM

## 2013-01-21 DIAGNOSIS — E785 Hyperlipidemia, unspecified: Secondary | ICD-10-CM

## 2013-01-21 LAB — HEPATIC FUNCTION PANEL
Albumin: 4 g/dL (ref 3.5–5.2)
Total Protein: 6.9 g/dL (ref 6.0–8.3)

## 2013-01-21 LAB — LIPID PANEL
HDL: 37.6 mg/dL — ABNORMAL LOW (ref >39.00)
Triglycerides: 198 mg/dL — ABNORMAL HIGH (ref 0.0–149.0)

## 2013-01-21 LAB — HEMOGLOBIN A1C: Hgb A1c MFr Bld: 7.3 % — ABNORMAL HIGH (ref 4.6–6.5)

## 2013-01-21 NOTE — Progress Notes (Signed)
Pre-visit discussion using our clinic review tool. No additional management support is needed unless otherwise documented below in the visit note.  Diabetes:  Using medications without difficulties: yes Hypoglycemic episodes:no Hyperglycemic episodes:no Feet problems: plantar fasciitis, injected and using a brace.  No numbness.  Blood Sugars averaging: not checked frequently eye exam within last year: due, encouraged Due for labs.   Hypertension:    Using medication without problems or lightheadedness: yes Chest pain with exertion:no Edema:no Short of breath:no  She is worried about her son. He has GI f/u for possible GI bleed.   She has had AM LUQ abd pain that improves with eating.  No vomiting, no blood in stool.  No RUQ pain.  Intermittent.    Elevated Cholesterol: Using medications without problems:yes Muscle aches: no Diet compliance: "not to good." Exercise: limited She has been rushing from one thing to another with family and work.  She is profoundly busy.    PMH and SH reviewed.   Vital signs, Meds and allergies reviewed.  ROS: See HPI.  Otherwise nontributory.   GEN: nad, alert and oriented HEENT: mucous membranes moist NECK: supple w/o LA CV: rrr PULM: ctab, no inc wob ABD: soft, +bs, not ttp, no rebound EXT: no edema SKIN: no acute rash  Diabetic foot exam: Normal inspection No skin breakdown No calluses  Normal DP pulses Normal sensation to light tough and monofilament Nails normal

## 2013-01-21 NOTE — Patient Instructions (Addendum)
Go to the lab on the way out.  We'll contact you with your lab report. Check on getting an eye exam when you get a chance.  Recheck in about 6 months, labs before a physical.   If you BP stays above >130/>90, then increase the losartan to 100mg  a day and notify me.   Take care. Glad to see you.

## 2013-01-22 ENCOUNTER — Encounter: Payer: Self-pay | Admitting: *Deleted

## 2013-01-22 NOTE — Assessment & Plan Note (Signed)
Likely GERD or gastric irritation.  No red flag sx.  Would try OTC zantac and report back as needed.  She agrees.

## 2013-01-22 NOTE — Assessment & Plan Note (Signed)
This is an atypical BP.  She'll check her BP out of clinic and inc ARB/notify us if still elevated.  She agrees.

## 2013-01-22 NOTE — Assessment & Plan Note (Signed)
Continue current meds, see notes on labs.  

## 2013-01-22 NOTE — Assessment & Plan Note (Signed)
Similar to prev, likely from fatty liver.  Will work on DM2, diet, etc.

## 2013-02-16 ENCOUNTER — Other Ambulatory Visit: Payer: Self-pay | Admitting: Podiatry

## 2013-04-14 ENCOUNTER — Telehealth: Payer: Self-pay | Admitting: Family Medicine

## 2013-04-14 NOTE — Telephone Encounter (Signed)
Pt's son dropped off forms to be filled out for pt. I put them in Dr. Josefine Class inbox on his desk.

## 2013-04-15 ENCOUNTER — Encounter: Payer: Self-pay | Admitting: *Deleted

## 2013-04-15 NOTE — Telephone Encounter (Signed)
Patient advised.   PPW mailed back to patient to bring with her at her CPE appt in May 2015.

## 2013-04-15 NOTE — Telephone Encounter (Signed)
I started to fill this out, then stopped. It has to have values from 03/11/13 or later. She has none that count. Have her hold the form until CPE in 07/2013.  We'll fill it out then.  Thanks.

## 2013-04-25 ENCOUNTER — Other Ambulatory Visit: Payer: Self-pay | Admitting: Podiatry

## 2013-06-16 ENCOUNTER — Ambulatory Visit (INDEPENDENT_AMBULATORY_CARE_PROVIDER_SITE_OTHER): Payer: 59 | Admitting: Family Medicine

## 2013-06-16 ENCOUNTER — Encounter: Payer: Self-pay | Admitting: Family Medicine

## 2013-06-16 VITALS — BP 150/100 | HR 77 | Temp 97.5°F | Ht 64.0 in | Wt 179.0 lb

## 2013-06-16 DIAGNOSIS — M722 Plantar fascial fibromatosis: Secondary | ICD-10-CM

## 2013-06-16 NOTE — Patient Instructions (Signed)
Please read handouts on Plantar Fascitis.  STRETCHING and Strengthening program critically important.  Strengthening on foot and calf muscles as seen in handout. Calf raises, 2 legged, then 1 legged. Foot massage with tennis ball. Ice massage.  NEEDS TO BE DONE EVERY DAY  Recommended over the counter insoles. (Spenco or Hapad)  A rigid shoe with good arch support helps: Dansko (great), Keen, Merrell No easily bendable shoes.   

## 2013-06-16 NOTE — Progress Notes (Signed)
Date:  06/16/2013   Name:  Deborah Alvarado   DOB:  December 15, 1950   MRN:  [id number]  PCP:  Elsie Stain, MD   This 63 y.o. female patient presents with a > 1 month long history of heel pain with intermittent PF for years. This is notable for worsening pain first thing in the morning when arising and standing after sitting.   Never done any prior rehab or HEP. Has seen podiatry and had a few injections in the past, either foot. 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  Prior foot or ankle fractures: none Prior operations: none Orthotics or bracing: instep wrap Medications: nsaids and tylenol prn PT or home rehab: none Night splints: no Ice massage: no Ball massage: no  Metatarsal pain: no  The PMH, PSH, Social History, Family History, Medications, and allergies have been reviewed in [facility], and have been updated if relevant.  REVIEW OF SYSTEMS  GEN: No fevers, chills. Nontoxic. Primarily MSK c/o today. MSK: Detailed in the HPI GI: tolerating PO intake without difficulty Neuro: No numbness, parasthesias, or tingling associated. Otherwise the pertinent positives of the ROS are noted above.   PHYSICAL EXAM  Blood pressure 150/100, pulse 77, temperature 97.5 F (36.4 C), temperature source Oral, height 5\' 4"  (1.626 m), weight 179 lb (81.194 kg).  GEN: Well-developed,well-nourished,in no acute distress; alert,appropriate and cooperative throughout examination HEENT: Normocephalic and atraumatic without obvious abnormalities. Ears, externally no deformities PULM: Breathing comfortably in no respiratory distress EXT: No clubbing, cyanosis, or edema PSYCH: Normally interactive. Cooperative during the interview. Pleasant. Friendly and conversant. Not anxious or depressed appearing. Normal, full affect.  R foot Echymosis: no Edema: no ROM: full LE B Gait: heel toe, mildly antalgic MT pain: no Callus pattern: none Lateral Mall: NT Medial Mall: NT Talus: NT Navicular: NT Calcaneous:  NT Metatarsals: NT 5th MT: NT Phalanges: NT Achilles: NT Plantar Fascia: tender, medial along PF. Pain with forced dorsi Fat Pad: NT Peroneals: NT Post Tib: NT Great Toe: Nml motion Ant Drawer: neg Other foot breakdown: none Sensation: intact  A/P: Plantar fascitis:  >25 minutes spent in face to face time with patient, >50% spent in counselling or coordination of care  We reviewed that stretching is critically important to the treatment of PF. Reviewed footwear. Rigid soles have been shown to help with PF.  Reviewed rehab of stretching and calf raises.  Reviewed rehab from [facility]  Could benefit from a corticosteroid injection if conservative treatment fails.  Plantar Fascitis Injection, R Verbal consent obtained. Risks including risk of rupture, hypopigmentation, and infection reviewed in addition to benefits and alternatives were reviewed. Chloraprep used for prep. Ethyl Chloride used for anesthesia. Under sterile conditions, using the medial approach 4 cc of Lidocaine 1% and 1/2 cc of Depo-Medrol 40 mg injected superior to plantar fascia and fanned. No compications. Decreased pain post-injection.   New Prescriptions   No medications on file    Patient Instructions: Please read handouts on Plantar Fascitis.  STRETCHING and Strengthening program critically important.  Strengthening on foot and calf muscles as seen in handout. Calf raises, 2 legged, then 1 legged. Foot massage with tennis ball. Ice massage.  NEEDS TO BE DONE EVERY DAY  Recommended over the counter insoles. (Spenco or Hapad)  A rigid shoe with good arch support helps: Dansko (great), Jennet Maduro, Merrell No easily bendable shoes.    Signed,  Maud Deed. Bari Handshoe, MD, [place] [facility] [place] [address]  [place] [address] Phone: [phone] Fax: [phone]   Patient's Medications  New Prescriptions   No medications on file   Previous Medications   CHOLECALCIFEROL (VITAMIN D) 1000 UNITS TABLET    Take 1,000 Units by mouth daily.     FLUTICASONE (FLONASE) 50 MCG/ACT NASAL SPRAY    Two sprays each nostril per day.   GLIMEPIRIDE (AMARYL) 2 MG TABLET    Take 1 tablet (2 mg total) by mouth 2 (two) times daily.   LOSARTAN (COZAAR) 50 MG TABLET    Take 1 tablet (50 mg total) by mouth daily.   MELOXICAM (MOBIC) 15 MG TABLET    TAKE 1 TABLET BY MOUTH EVERY DAY   METFORMIN (GLUCOPHAGE) 500 MG TABLET    Take up to 2 tabs twice a day   SIMVASTATIN (ZOCOR) 40 MG TABLET    Take 1 tablet (40 mg total) by mouth at bedtime.   VITAMIN C (ASCORBIC ACID) 500 MG TABLET    Take 500 mg by mouth daily.    Modified Medications   No medications on file  Discontinued Medications   No medications on file

## 2013-06-16 NOTE — Progress Notes (Signed)
Pre visit review using our clinic review tool, if applicable. No additional management support is needed unless otherwise documented below in the visit note. 

## 2013-07-01 ENCOUNTER — Other Ambulatory Visit: Payer: Self-pay | Admitting: Family Medicine

## 2013-07-01 DIAGNOSIS — E119 Type 2 diabetes mellitus without complications: Secondary | ICD-10-CM

## 2013-07-11 ENCOUNTER — Other Ambulatory Visit: Payer: Self-pay | Admitting: Podiatry

## 2013-07-15 ENCOUNTER — Other Ambulatory Visit (INDEPENDENT_AMBULATORY_CARE_PROVIDER_SITE_OTHER): Payer: 59

## 2013-07-15 DIAGNOSIS — R7401 Elevation of levels of liver transaminase levels: Secondary | ICD-10-CM

## 2013-07-15 DIAGNOSIS — E119 Type 2 diabetes mellitus without complications: Secondary | ICD-10-CM

## 2013-07-15 DIAGNOSIS — Z0000 Encounter for general adult medical examination without abnormal findings: Secondary | ICD-10-CM

## 2013-07-15 DIAGNOSIS — R740 Nonspecific elevation of levels of transaminase and lactic acid dehydrogenase [LDH]: Secondary | ICD-10-CM

## 2013-07-15 DIAGNOSIS — E785 Hyperlipidemia, unspecified: Secondary | ICD-10-CM

## 2013-07-15 DIAGNOSIS — I10 Essential (primary) hypertension: Secondary | ICD-10-CM

## 2013-07-15 LAB — COMPREHENSIVE METABOLIC PANEL
ALK PHOS: 86 U/L (ref 39–117)
ALT: 65 U/L — ABNORMAL HIGH (ref 0–35)
AST: 47 U/L — AB (ref 0–37)
Albumin: 4 g/dL (ref 3.5–5.2)
BILIRUBIN TOTAL: 0.8 mg/dL (ref 0.2–1.2)
BUN: 19 mg/dL (ref 6–23)
CALCIUM: 9.3 mg/dL (ref 8.4–10.5)
CHLORIDE: 104 meq/L (ref 96–112)
CO2: 27 mEq/L (ref 19–32)
CREATININE: 0.8 mg/dL (ref 0.4–1.2)
GFR: 81.01 mL/min (ref >60.00)
Glucose, Bld: 170 mg/dL — ABNORMAL HIGH (ref 70–99)
Potassium: 4.3 mEq/L (ref 3.5–5.1)
Sodium: 138 mEq/L (ref 135–145)
Total Protein: 6.6 g/dL (ref 6.0–8.3)

## 2013-07-15 LAB — LIPID PANEL
CHOL/HDL RATIO: 5
Cholesterol: 184 mg/dL (ref 0–200)
HDL: 38 mg/dL — AB (ref >39.00)
LDL Cholesterol: 98 mg/dL (ref 0–99)
TRIGLYCERIDES: 239 mg/dL — AB (ref 0.0–149.0)
VLDL: 47.8 mg/dL — AB (ref 0.0–40.0)

## 2013-07-15 LAB — HEMOGLOBIN A1C: Hgb A1c MFr Bld: 8 % — ABNORMAL HIGH (ref 4.6–6.5)

## 2013-07-22 ENCOUNTER — Encounter: Payer: Self-pay | Admitting: Family Medicine

## 2013-07-22 ENCOUNTER — Ambulatory Visit (INDEPENDENT_AMBULATORY_CARE_PROVIDER_SITE_OTHER): Payer: 59 | Admitting: Family Medicine

## 2013-07-22 VITALS — BP 140/92 | HR 75 | Temp 97.5°F | Ht 63.5 in | Wt 177.2 lb

## 2013-07-22 DIAGNOSIS — E785 Hyperlipidemia, unspecified: Secondary | ICD-10-CM

## 2013-07-22 DIAGNOSIS — E119 Type 2 diabetes mellitus without complications: Secondary | ICD-10-CM

## 2013-07-22 DIAGNOSIS — I10 Essential (primary) hypertension: Secondary | ICD-10-CM

## 2013-07-22 DIAGNOSIS — Z0000 Encounter for general adult medical examination without abnormal findings: Secondary | ICD-10-CM

## 2013-07-22 MED ORDER — SIMVASTATIN 40 MG PO TABS: 40.0000 mg | ORAL_TABLET | Freq: Every day | ORAL | Status: DC

## 2013-07-22 MED ORDER — METFORMIN HCL 500 MG PO TABS: ORAL_TABLET | ORAL | Status: DC

## 2013-07-22 MED ORDER — MELOXICAM 15 MG PO TABS: 15.0000 mg | ORAL_TABLET | Freq: Every day | ORAL | Status: DC

## 2013-07-22 MED ORDER — FLUTICASONE PROPIONATE 50 MCG/ACT NA SUSP: NASAL | Status: DC

## 2013-07-22 MED ORDER — LOSARTAN POTASSIUM 50 MG PO TABS: 50.0000 mg | ORAL_TABLET | Freq: Every day | ORAL | Status: DC

## 2013-07-22 MED ORDER — GLIMEPIRIDE 2 MG PO TABS: 2.0000 mg | ORAL_TABLET | Freq: Two times a day (BID) | ORAL | Status: DC

## 2013-07-22 NOTE — Progress Notes (Signed)
Pre visit review using our clinic review tool, if applicable. No additional management support is needed unless otherwise documented below in the visit note.  CPE- See plan.  Routine anticipatory guidance given to patient.  See health maintenance. Tetanus 2007 Shingles shot 2014 PNA 2012 Flu 2014 Colon 2010 Mammogram- due.  D/w pt.  She has it scheduled.   DXA can be pushed back for now, d/w pt.  S/p hysterectomy.  Pap not due.  Back in the gym.  Weights at the gym.  Diet d/w pt.  "occ cheating".  Some ice cream.  Living will d/w pt.  Husband designated if she were incapacitated.     Plantar fasciitis.  Still with some pain.  Stretching and using inserts.  Prev injection didn't help.    Diabetes: Using medications without difficulties: yes, had been missing her AM dose.  Discussed taking after breakfast if needed.  Hypoglycemic episodes: no Hyperglycemic episodes: no Feet problems: see above Blood Sugars averaging: not checked.  eye exam within last year:due.  D/w pt.   Hypertension:    Using medication without problems or lightheadedness: yes Chest pain with exertion: no Edema:no Short of breath:no  Elevated Cholesterol: Using medications without problems:yes Muscle aches: no Diet compliance:see above Exercise:see above  PMH and SH reviewed  Meds, vitals, and allergies reviewed.   ROS: See HPI.  Otherwise negative.    GEN: nad, alert and oriented HEENT: mucous membranes moist NECK: supple w/o LA CV: rrr. PULM: ctab, no inc wob ABD: soft, +bs EXT: no edema SKIN: no acute rash  Diabetic foot exam: Normal inspection No skin breakdown No calluses  Normal DP pulses Normal sensation to light touch and monofilament Nails normal

## 2013-07-22 NOTE — Patient Instructions (Addendum)
Call about an eye exam.  Try taking the metformin after breakfast and see if that is easier to tolerate.  Recheck in about 3 months.  Labs ahead of time.  Avoid sweets in the meantime . Take care.  Glad to see you.

## 2013-07-23 ENCOUNTER — Telehealth: Payer: Self-pay | Admitting: Family Medicine

## 2013-07-23 NOTE — Assessment & Plan Note (Signed)
Routine anticipatory guidance given to patient.  See health maintenance. Tetanus 2007 Shingles shot 2014 PNA 2012 Flu 2014 Colon 2010 Mammogram- due.  D/w pt.  She has it scheduled.   DXA can be pushed back for now, d/w pt.  S/p hysterectomy.  Pap not due.  Back in the gym.  Weights at the gym.  Diet d/w pt.  "occ cheating".  Some ice cream.  Living will d/w pt.  Husband designated if she were incapacitated.

## 2013-07-23 NOTE — Telephone Encounter (Signed)
Relevant patient education assigned to patient using Emmi. ° °

## 2013-07-23 NOTE — Assessment & Plan Note (Signed)
D/w pt re: diet and exercise.  Continue current meds for now.  Weight loss the main issue.  She agrees.  Labs d/w pt.

## 2013-07-23 NOTE — Assessment & Plan Note (Signed)
D/w pt re: diet and exercise.  Continue current meds for now, she'll work on getting the AM dose of Dm2 meds done.  Weight loss the main issue.  She agrees.  Labs d/w pt.

## 2013-07-23 NOTE — Assessment & Plan Note (Signed)
D/w pt re: diet and exercise.  Continue current meds for now.  Weight loss the main issue.  She agrees.  Labs d/w pt.  

## 2013-07-28 ENCOUNTER — Telehealth: Payer: Self-pay | Admitting: Family Medicine

## 2013-07-28 NOTE — Telephone Encounter (Signed)
Pt 's spouse dropped off health care form to be filled out by Dr [name] for [facility]. Pts spouse will pick up if you will call when when ready. Form placed on Lugene's desk to give to Dr [name]. Thank you

## 2013-07-28 NOTE — Telephone Encounter (Signed)
This is in your inbox

## 2013-07-28 NOTE — Telephone Encounter (Signed)
Done. Thanks.

## 2013-07-29 NOTE — Telephone Encounter (Signed)
Left detailed message on voicemail.  Note left at front desk for pick up.

## 2013-08-10 ENCOUNTER — Ambulatory Visit: Payer: Self-pay | Admitting: Family Medicine

## 2013-08-10 ENCOUNTER — Encounter: Payer: Self-pay | Admitting: Family Medicine

## 2013-08-11 ENCOUNTER — Encounter: Payer: Self-pay | Admitting: Family Medicine

## 2013-09-27 ENCOUNTER — Other Ambulatory Visit: Payer: Self-pay | Admitting: Family Medicine

## 2013-10-22 ENCOUNTER — Other Ambulatory Visit: Payer: 59

## 2013-11-04 ENCOUNTER — Ambulatory Visit: Payer: 59 | Admitting: Family Medicine

## 2014-07-28 ENCOUNTER — Telehealth: Payer: Self-pay | Admitting: Family Medicine

## 2014-07-28 ENCOUNTER — Telehealth: Payer: Self-pay | Admitting: *Deleted

## 2014-07-28 NOTE — Telephone Encounter (Signed)
-----   Message from Tonia Ghent, MD sent at 07/28/2014  8:13 AM EDT ----- Due for Dm2 f/u now, labs ahead of time. Then due for CPE later this summer.  Thanks.

## 2014-07-28 NOTE — Telephone Encounter (Signed)
Scheduled dm follow up for 5/24 and labs for 5/23. Pt will schedule cpe when she checks in on 5/24

## 2014-07-31 ENCOUNTER — Other Ambulatory Visit: Payer: Self-pay | Admitting: Family Medicine

## 2014-07-31 DIAGNOSIS — IMO0002 Reserved for concepts with insufficient information to code with codable children: Secondary | ICD-10-CM

## 2014-07-31 DIAGNOSIS — E1165 Type 2 diabetes mellitus with hyperglycemia: Secondary | ICD-10-CM

## 2014-08-01 ENCOUNTER — Other Ambulatory Visit (INDEPENDENT_AMBULATORY_CARE_PROVIDER_SITE_OTHER): Payer: 59

## 2014-08-01 DIAGNOSIS — E1165 Type 2 diabetes mellitus with hyperglycemia: Secondary | ICD-10-CM | POA: Diagnosis not present

## 2014-08-01 DIAGNOSIS — IMO0002 Reserved for concepts with insufficient information to code with codable children: Secondary | ICD-10-CM

## 2014-08-01 LAB — COMPREHENSIVE METABOLIC PANEL
ALBUMIN: 4.1 g/dL (ref 3.5–5.2)
ALT: 65 U/L — ABNORMAL HIGH (ref 0–35)
AST: 54 U/L — ABNORMAL HIGH (ref 0–37)
Alkaline Phosphatase: 118 U/L — ABNORMAL HIGH (ref 39–117)
BILIRUBIN TOTAL: 0.6 mg/dL (ref 0.2–1.2)
BUN: 14 mg/dL (ref 6–23)
CALCIUM: 9.3 mg/dL (ref 8.4–10.5)
CO2: 27 mEq/L (ref 19–32)
CREATININE: 0.68 mg/dL (ref 0.40–1.20)
Chloride: 104 mEq/L (ref 96–112)
GFR: 93.18 mL/min (ref >60.00)
GLUCOSE: 308 mg/dL — AB (ref 70–99)
Potassium: 4.3 mEq/L (ref 3.5–5.1)
Sodium: 137 mEq/L (ref 135–145)
TOTAL PROTEIN: 6.6 g/dL (ref 6.0–8.3)

## 2014-08-01 LAB — LIPID PANEL
CHOLESTEROL: 189 mg/dL (ref 0–200)
HDL: 35.8 mg/dL — ABNORMAL LOW (ref >39.00)
LDL Cholesterol: 115 mg/dL — ABNORMAL HIGH (ref 0–99)
NonHDL: 153.2
TRIGLYCERIDES: 189 mg/dL — AB (ref 0.0–149.0)
Total CHOL/HDL Ratio: 5
VLDL: 37.8 mg/dL (ref 0.0–40.0)

## 2014-08-01 LAB — HEMOGLOBIN A1C: HEMOGLOBIN A1C: 10 % — AB (ref 4.6–6.5)

## 2014-08-02 ENCOUNTER — Ambulatory Visit (INDEPENDENT_AMBULATORY_CARE_PROVIDER_SITE_OTHER): Payer: 59 | Admitting: Family Medicine

## 2014-08-02 ENCOUNTER — Encounter: Payer: Self-pay | Admitting: Family Medicine

## 2014-08-02 VITALS — BP 160/100 | HR 81 | Temp 98.7°F | Wt 170.0 lb

## 2014-08-02 DIAGNOSIS — IMO0002 Reserved for concepts with insufficient information to code with codable children: Secondary | ICD-10-CM

## 2014-08-02 DIAGNOSIS — R740 Nonspecific elevation of levels of transaminase and lactic acid dehydrogenase [LDH]: Secondary | ICD-10-CM

## 2014-08-02 DIAGNOSIS — N760 Acute vaginitis: Secondary | ICD-10-CM | POA: Diagnosis not present

## 2014-08-02 DIAGNOSIS — E1165 Type 2 diabetes mellitus with hyperglycemia: Secondary | ICD-10-CM | POA: Diagnosis not present

## 2014-08-02 DIAGNOSIS — R7401 Elevation of levels of liver transaminase levels: Secondary | ICD-10-CM

## 2014-08-02 MED ORDER — FLUCONAZOLE 150 MG PO TABS: 150.0000 mg | ORAL_TABLET | Freq: Once | ORAL | Status: DC

## 2014-08-02 MED ORDER — METFORMIN HCL 500 MG PO TABS
ORAL_TABLET | ORAL | Status: DC
Start: 2014-08-02 — End: 2015-01-10

## 2014-08-02 MED ORDER — GLIMEPIRIDE 2 MG PO TABS: 2.0000 mg | ORAL_TABLET | Freq: Two times a day (BID) | ORAL | Status: DC

## 2014-08-02 NOTE — Patient Instructions (Addendum)
Call about an eye exam.  More salads, more water.  Less sugar.  Recheck labs in about 3 months, before a visit.  Start back on amaryl.   Max 2 metformin a day.   Skip the simvastatin while taking diflucan.

## 2014-08-02 NOTE — Progress Notes (Signed)
Pre visit review using our clinic review tool, if applicable. No additional management support is needed unless otherwise documented below in the visit note.  Diabetes:  Using medications without difficulties: off amaryl for about 1 year, taking 2 metformin a day = 1000mg  a day max Hypoglycemic episodes:not checked Hyperglycemic episodes: not checked Feet problems: some prev, not now Blood Sugars averaging: not checked eye exam within last year: due, d/w pt.   A1c up, d/w pt.    Recent yeast infection, d/w pt about sugar.  Failed monistat.  No discharge.  Itching.  No FCNAVD.    LFTs elevated, likely fatty liver.  D/w pt.    BP checks at home are controlled.    PMH and SH reviewed  Meds, vitals, and allergies reviewed.   ROS: See HPI.  Otherwise negative.    GEN: nad, alert and oriented HEENT: mucous membranes moist NECK: supple w/o LA CV: rrr. PULM: ctab, no inc wob ABD: soft, +bs EXT: no edema SKIN: no acute rash  Diabetic foot exam: Normal inspection No skin breakdown No calluses  Normal DP pulses Normal sensation to light touch and monofilament Nails normal

## 2014-08-04 DIAGNOSIS — N760 Acute vaginitis: Secondary | ICD-10-CM | POA: Insufficient documentation

## 2014-08-04 NOTE — Assessment & Plan Note (Addendum)
Had bene off amaryl for about 1 year, taking 2 metformin a day = 1000mg  a day max Restart amaryl, eat right diet given to patient.   D/w pt about labs and diet/weight.  She agrees.   Recheck in about 3 months.   >25 minutes spent in face to face time with patient, >50% spent in counselling or coordination of care.

## 2014-08-04 NOTE — Assessment & Plan Note (Signed)
Failed monistat. No discharge. Itching. No FCNAVD.  Start diflucan, hold statin in meantime.  She agrees.

## 2014-08-04 NOTE — Assessment & Plan Note (Signed)
Likely fatty liver, d/w pt.  Needs DM2 control and weight loss, she agrees.

## 2014-08-17 NOTE — Telephone Encounter (Signed)
error 

## 2014-09-06 ENCOUNTER — Other Ambulatory Visit: Payer: Self-pay | Admitting: Family Medicine

## 2014-10-25 ENCOUNTER — Other Ambulatory Visit: Payer: Self-pay | Admitting: Family Medicine

## 2014-10-25 DIAGNOSIS — E1165 Type 2 diabetes mellitus with hyperglycemia: Secondary | ICD-10-CM

## 2014-10-25 DIAGNOSIS — IMO0002 Reserved for concepts with insufficient information to code with codable children: Secondary | ICD-10-CM

## 2014-10-26 ENCOUNTER — Other Ambulatory Visit: Payer: 59

## 2014-11-02 ENCOUNTER — Ambulatory Visit: Payer: 59 | Admitting: Family Medicine

## 2015-01-08 ENCOUNTER — Other Ambulatory Visit: Payer: Self-pay | Admitting: Family Medicine

## 2015-01-09 NOTE — Telephone Encounter (Signed)
Electronic refill request. Last office visit:   08/02/14 stated: Recheck labs in about 3 months, before a visit. Please advise.

## 2015-01-10 NOTE — Telephone Encounter (Signed)
Patient returned Lugene's phone call.  Patient scheduled lab appointment on 01/12/15 and f/u appointment on 01/13/15. She's requesting her medication be called in to her pharmacy.

## 2015-01-10 NOTE — Telephone Encounter (Signed)
Patient advised that a FU OV and labs prior need to be scheduled before these refills can be sent.  It was difficult to reach the patient.  Home phone number went directly to fax machine and mobile number stated that she was not accepting calls and to call later.  I finally reached her at work but she was not able to be on the phone long enough to set up the appointments.  She will call back to get this done and she was instructed that these refills will be sent in after the appointments are scheduled.

## 2015-01-10 NOTE — Telephone Encounter (Signed)
Fill only after scheduling OV with labs ahead of time in the near future.  Then okay to send rxs as is.  I pended them for now.  Thanks.

## 2015-01-10 NOTE — Telephone Encounter (Signed)
Rx sent to pharmacy with no refills hopefully to ensure pt keeps appt

## 2015-01-12 ENCOUNTER — Other Ambulatory Visit (INDEPENDENT_AMBULATORY_CARE_PROVIDER_SITE_OTHER): Payer: 59

## 2015-01-12 DIAGNOSIS — IMO0002 Reserved for concepts with insufficient information to code with codable children: Secondary | ICD-10-CM

## 2015-01-12 DIAGNOSIS — E1165 Type 2 diabetes mellitus with hyperglycemia: Secondary | ICD-10-CM | POA: Diagnosis not present

## 2015-01-12 LAB — BASIC METABOLIC PANEL
BUN: 17 mg/dL (ref 6–23)
CALCIUM: 9.8 mg/dL (ref 8.4–10.5)
CHLORIDE: 101 meq/L (ref 96–112)
CO2: 28 meq/L (ref 19–32)
CREATININE: 0.69 mg/dL (ref 0.40–1.20)
GFR: 91.49 mL/min (ref >60.00)
Glucose, Bld: 244 mg/dL — ABNORMAL HIGH (ref 70–99)
Potassium: 4.3 mEq/L (ref 3.5–5.1)
Sodium: 138 mEq/L (ref 135–145)

## 2015-01-12 LAB — HEMOGLOBIN A1C: Hgb A1c MFr Bld: 10.2 % — ABNORMAL HIGH (ref 4.6–6.5)

## 2015-01-13 ENCOUNTER — Ambulatory Visit (INDEPENDENT_AMBULATORY_CARE_PROVIDER_SITE_OTHER): Payer: 59 | Admitting: Family Medicine

## 2015-01-13 ENCOUNTER — Encounter: Payer: Self-pay | Admitting: Family Medicine

## 2015-01-13 VITALS — BP 144/78 | HR 84 | Temp 97.9°F | Wt 167.5 lb

## 2015-01-13 DIAGNOSIS — E1165 Type 2 diabetes mellitus with hyperglycemia: Secondary | ICD-10-CM

## 2015-01-13 DIAGNOSIS — IMO0001 Reserved for inherently not codable concepts without codable children: Secondary | ICD-10-CM

## 2015-01-13 MED ORDER — GLIMEPIRIDE 2 MG PO TABS: ORAL_TABLET | ORAL | Status: DC

## 2015-01-13 MED ORDER — METFORMIN HCL 500 MG PO TABS: 500.0000 mg | ORAL_TABLET | Freq: Two times a day (BID) | ORAL | Status: DC

## 2015-01-13 NOTE — Patient Instructions (Addendum)
Call about an eye exam when you can.   Start back on your meds in the meantime.  Recheck in about 3 months.  Labs ahead of time.  Cut out sweets and tea and get back in the gym.   Take care.  Glad to see you.

## 2015-01-13 NOTE — Progress Notes (Signed)
Pre visit review using our clinic review tool, if applicable. No additional management support is needed unless otherwise documented below in the visit note.  Diabetes:  Using medications without difficulties: had run out of meds recently.  Prev tolerated her meds.   Hypoglycemic episodes: no  Hyperglycemic episodes: likely yes Feet problems: no Blood Sugars averaging: not checked recently eye exam within last year:due, d/w pt.   A1c still up.  D/w pt.    Meds, vitals, and allergies reviewed.   ROS: See HPI.  Otherwise negative.    GEN: nad, alert and oriented HEENT: mucous membranes moist NECK: supple w/o LA CV: rrr. PULM: ctab, no inc wob ABD: soft, +bs EXT: no edema SKIN: no acute rash  Diabetic foot exam: Normal inspection No skin breakdown No calluses  Normal DP pulses Normal sensation to light touch and monofilament Nails normal

## 2015-01-15 NOTE — Assessment & Plan Note (Signed)
A1c still up.  D/w pt.   Needs work on diet and exercise.  Needs to restart meds.   rx given to pt for meter to restart checking sugar.  Will likely need insulin w/o changes by patient.  Recheck in 3 months, sooner if needed.

## 2015-07-12 ENCOUNTER — Ambulatory Visit (INDEPENDENT_AMBULATORY_CARE_PROVIDER_SITE_OTHER): Payer: 59 | Admitting: Family Medicine

## 2015-07-12 ENCOUNTER — Encounter: Payer: Self-pay | Admitting: Family Medicine

## 2015-07-12 VITALS — BP 122/76 | HR 84 | Temp 97.7°F | Wt 170.0 lb

## 2015-07-12 DIAGNOSIS — IMO0001 Reserved for inherently not codable concepts without codable children: Secondary | ICD-10-CM

## 2015-07-12 DIAGNOSIS — J069 Acute upper respiratory infection, unspecified: Secondary | ICD-10-CM | POA: Diagnosis not present

## 2015-07-12 DIAGNOSIS — E1165 Type 2 diabetes mellitus with hyperglycemia: Secondary | ICD-10-CM

## 2015-07-12 DIAGNOSIS — N760 Acute vaginitis: Secondary | ICD-10-CM

## 2015-07-12 MED ORDER — FLUCONAZOLE 150 MG PO TABS: 150.0000 mg | ORAL_TABLET | Freq: Once | ORAL | Status: DC

## 2015-07-12 NOTE — Patient Instructions (Signed)
It sounds like an uncomplicated cold that we can treat at home.    Colds are very common and may make you feel uncomfortable.   Colds are caused by viruses, and no medicine or 'shot'will cure an uncomplicated cold.  FOR A STUFFY NOSE - USE NASAL WASHES:   Saline (salt water) nasal irrigation (nasal wash) is an effective and simple home remedy for treating stuffy nose and sinus congestion. The nose can be irrigated by pouring, spraying, or squirting salt water into the nose and then letting it run back out. * How it Helps: The salt water rinses out excess mucus, washes out any irritants (dust, allergens) that might be present, and moistens the nasal cavity. * Methods: There are several ways to perform nasal irrigation. You can use a saline nasal spray bottle (available over-the-counter), a rubber ear syringe, a medical syringe without the needle, or a NETI POT. STEP-BY-STEP INSTRUCTIONS:   STEP 1: Lean over a sink. STEP 2: Gently squirt or spray warm salt water into one of your nostrils.  STEP 3: Some of the water may run into the back of your throat. Spit this out. If you swallow the salt water it will not hurt you. STEP 4: Blow your nose to clean out the water and mucus.  STEP 5: Repeat steps 1-4 for the other nostril. You can do this a couple times a day if it seems to help you.   HOW TO MAKE SALINE (SALT WATER) NASAL WASH:   You can make your own saline nasal wash.  * Add 1/2 tsp of table salt to 1 cup (8 oz; 240 ml) of warm water. * You should use sterile, distilled, or previously boiled water for nasal irrigation.   FOR A RUNNY NOSE - BLOW YOUR NOSE:   If the skin around your nostrils gets irritated, apply a tiny amount of petroleum ointment to the nasal openings once or twice a day. MEDICINES FOR STUFFY OR RUNNY NOSE: If you have a very runny nose and you really think you need a medicine, you can try using a nasal decongestant for a couple days.   TREATMENT FOR ASSOCIATED SYMPTOMS  OF COLDS:   * Sore throat: throat lozenges, hard candy or warm chicken broth. * For muscle aches, headaches, or moderate fever (over 101 degrees F) (38.9 C) use acetaminophen every 4 hours.   Cough: use cough drops.   Hydrate: drink extra liquids. HUMIDIFIER: If the air in your home is dry, use a humidifier.   CONTAGIOUSNESS: * The cold virus is present in your nasal secretions. * Cover your nose and mouth with a tissue when you sneeze or cough. * Wash your hands frequently with soap and water. * You can return to work or school after the fever is gone and you feel well enough to participate in normal activities.  EXPECTED COURSE: * Fever 2-3 days * Nasal discharge 7-14 days * Cough 2-3 weeks. CALL BACK IF: * Fever lasts over 3 days * Runny nose lasts over 10 days * You become short of breath * You become worse  

## 2015-07-12 NOTE — Progress Notes (Signed)
SUBJECTIVE:  Deborah Alvarado is a 65 y.o. female pt of Dr. [name], new to me, who complains of coryza, congestion, productive cough and bilateral sinus pain for 5 days. She denies a history of chest pain and fevers and denies a history of asthma. Patient denies smoke cigarettes.   Current Outpatient Prescriptions on File Prior to Visit  Medication Sig Dispense Refill  . cholecalciferol (VITAMIN D) 1000 UNITS tablet Take 1,000 Units by mouth daily.      Marland Kitchen glimepiride (AMARYL) 2 MG tablet TAKE 1 TABLET (2 MG TOTAL) BY MOUTH 2 (TWO) TIMES DAILY. 180 tablet 3  . losartan (COZAAR) 50 MG tablet TAKE 1 TABLET (50 MG TOTAL) BY MOUTH DAILY. 90 tablet 0  . meloxicam (MOBIC) 15 MG tablet Take 1 tablet (15 mg total) by mouth daily. 90 tablet 3  . metFORMIN (GLUCOPHAGE) 500 MG tablet Take 1 tablet (500 mg total) by mouth 2 (two) times daily with a meal.    . simvastatin (ZOCOR) 40 MG tablet TAKE 1 TABLET BY MOUTH AT BEDTIME 90 tablet 0  . vitamin C (ASCORBIC ACID) 500 MG tablet Take 500 mg by mouth daily.       No current facility-administered medications on file prior to visit.    Allergies  Allergen Reactions  . Lisinopril     Cough, presumed  . Metformin And Related Other (See Comments)    Diarrhea at >1000mg  a day    Past Medical History  Diagnosis Date  . Allergy   . Anxiety   . Diabetes mellitus     Type II  . Hypertension   . Hyperlipidemia   . NSVD (normal spontaneous vaginal delivery)     x 2    Past Surgical History  Procedure Laterality Date  . Total abdominal hysterectomy  01/2005    due to fibroids    Family History  Problem Relation Age of Onset  . Cancer Mother     Leukemia  . Diabetes Brother   . Breast cancer Maternal Grandmother   . Colon cancer Neg Hx     Social History   Social History  . Marital Status: Married    Spouse Name: N/A  . Number of Children: 2  . Years of Education: N/A   Occupational History  .  [employer]   Social History Main  Topics  . Smoking status: Never Smoker   . Smokeless tobacco: Never Used  . Alcohol Use: No  . Drug Use: No  . Sexual Activity: Not on file   Other Topics Concern  . Not on file   Social History Narrative   Exercise at the gym   Grandchild died 05-20-2022   Works at [employer].   The PMH, PSH, Social History, Family History, Medications, and allergies have been reviewed in [facility], and have been updated if relevant.  OBJECTIVE: BP 122/76 mmHg  Pulse 84  Temp(Src) 97.7 F (36.5 C) (Oral)  Wt 170 lb (77.111 kg)  SpO2 96%  She appears well, vital signs are as noted. Ears normal.  Throat and pharynx normal.  Neck supple. No adenopathy in the neck. Nose is congested. Sinuses non tender. The chest is clear, without wheezes or rales.  ASSESSMENT:  viral upper respiratory illness  PLAN: Symptomatic therapy suggested: push fluids, rest and return office visit prn if symptoms persist or worsen. Lack of antibiotic effectiveness discussed with her. Call or return to clinic prn if these symptoms worsen or fail to improve as anticipated.

## 2015-07-12 NOTE — Progress Notes (Signed)
Pre visit review using our clinic review tool, if applicable. No additional management support is needed unless otherwise documented below in the visit note. 

## 2015-10-08 ENCOUNTER — Other Ambulatory Visit: Payer: Self-pay | Admitting: Family Medicine

## 2015-10-08 NOTE — Telephone Encounter (Signed)
Sent.  Needs f/u with labs ahead of time.  Way overdue.  Thanks.

## 2015-10-09 ENCOUNTER — Encounter: Payer: Self-pay | Admitting: *Deleted

## 2015-10-25 ENCOUNTER — Other Ambulatory Visit: Payer: Self-pay | Admitting: Family Medicine

## 2015-10-25 DIAGNOSIS — Z1231 Encounter for screening mammogram for malignant neoplasm of breast: Secondary | ICD-10-CM

## 2015-11-01 ENCOUNTER — Ambulatory Visit
Admission: RE | Admit: 2015-11-01 | Discharge: 2015-11-01 | Disposition: A | Discharge status: Home / Self Care | Payer: 59 | Source: Ambulatory Visit | Attending: Family Medicine | Admitting: Family Medicine

## 2015-11-01 DIAGNOSIS — Z1231 Encounter for screening mammogram for malignant neoplasm of breast: Secondary | ICD-10-CM | POA: Diagnosis not present

## 2015-11-01 LAB — HM DIABETES EYE EXAM

## 2015-11-02 ENCOUNTER — Other Ambulatory Visit: Payer: Self-pay | Admitting: *Deleted

## 2015-11-02 NOTE — Telephone Encounter (Signed)
Phoned patient to give mammogram results and patient says she has a yeast infection that she has used OTC Monistat and cortisone to no avail.  She says the cortisone cleared it on the outside but not inside.  Please advise.

## 2015-11-03 MED ORDER — FLUCONAZOLE 150 MG PO TABS: 150.0000 mg | ORAL_TABLET | Freq: Once | ORAL | 0 refills | Status: AC

## 2015-11-03 NOTE — Telephone Encounter (Signed)
Sent.  Thanks.  Skip the simvastatin when she takes the fluconazole.

## 2015-11-03 NOTE — Telephone Encounter (Signed)
Left detailed message on voicemail.  

## 2015-12-05 ENCOUNTER — Encounter: Payer: Self-pay | Admitting: Family Medicine

## 2016-01-04 ENCOUNTER — Other Ambulatory Visit: Payer: Self-pay | Admitting: Family Medicine

## 2016-01-29 ENCOUNTER — Other Ambulatory Visit (INDEPENDENT_AMBULATORY_CARE_PROVIDER_SITE_OTHER): Payer: 59

## 2016-01-29 ENCOUNTER — Other Ambulatory Visit: Payer: Self-pay | Admitting: Family Medicine

## 2016-01-29 DIAGNOSIS — E119 Type 2 diabetes mellitus without complications: Secondary | ICD-10-CM

## 2016-01-29 LAB — LIPID PANEL
CHOL/HDL RATIO: 4
CHOLESTEROL: 170 mg/dL (ref 0–200)
HDL: 38.7 mg/dL — AB (ref >39.00)
NonHDL: 131.11
TRIGLYCERIDES: 219 mg/dL — AB (ref 0.0–149.0)
VLDL: 43.8 mg/dL — AB (ref 0.0–40.0)

## 2016-01-29 LAB — COMPREHENSIVE METABOLIC PANEL
ALBUMIN: 4.1 g/dL (ref 3.5–5.2)
ALT: 43 U/L — ABNORMAL HIGH (ref 0–35)
AST: 32 U/L (ref 0–37)
Alkaline Phosphatase: 82 U/L (ref 39–117)
BUN: 17 mg/dL (ref 6–23)
CHLORIDE: 105 meq/L (ref 96–112)
CO2: 27 mEq/L (ref 19–32)
Calcium: 9.4 mg/dL (ref 8.4–10.5)
Creatinine, Ser: 0.76 mg/dL (ref 0.40–1.20)
GFR: 81.56 mL/min (ref >60.00)
Glucose, Bld: 206 mg/dL — ABNORMAL HIGH (ref 70–99)
POTASSIUM: 4 meq/L (ref 3.5–5.1)
SODIUM: 141 meq/L (ref 135–145)
Total Bilirubin: 0.6 mg/dL (ref 0.2–1.2)
Total Protein: 6.7 g/dL (ref 6.0–8.3)

## 2016-01-29 LAB — LDL CHOLESTEROL, DIRECT: LDL DIRECT: 111 mg/dL

## 2016-01-29 LAB — HEMOGLOBIN A1C: Hgb A1c MFr Bld: 10 % — ABNORMAL HIGH (ref 4.6–6.5)

## 2016-02-05 ENCOUNTER — Ambulatory Visit (INDEPENDENT_AMBULATORY_CARE_PROVIDER_SITE_OTHER): Payer: 59 | Admitting: Family Medicine

## 2016-02-05 ENCOUNTER — Encounter: Payer: Self-pay | Admitting: Family Medicine

## 2016-02-05 VITALS — BP 138/88 | HR 90 | Temp 98.5°F | Ht 64.0 in | Wt 166.2 lb

## 2016-02-05 DIAGNOSIS — Z794 Long term (current) use of insulin: Secondary | ICD-10-CM | POA: Diagnosis not present

## 2016-02-05 DIAGNOSIS — M722 Plantar fascial fibromatosis: Secondary | ICD-10-CM

## 2016-02-05 DIAGNOSIS — E1165 Type 2 diabetes mellitus with hyperglycemia: Secondary | ICD-10-CM | POA: Diagnosis not present

## 2016-02-05 DIAGNOSIS — IMO0001 Reserved for inherently not codable concepts without codable children: Secondary | ICD-10-CM

## 2016-02-05 MED ORDER — SIMVASTATIN 40 MG PO TABS: 40.0000 mg | ORAL_TABLET | Freq: Every day | ORAL | 3 refills | Status: DC

## 2016-02-05 MED ORDER — GLIMEPIRIDE 2 MG PO TABS: ORAL_TABLET | ORAL | 3 refills | Status: DC

## 2016-02-05 MED ORDER — MELOXICAM 15 MG PO TABS: 15.0000 mg | ORAL_TABLET | Freq: Every day | ORAL | 3 refills | Status: DC | PRN

## 2016-02-05 MED ORDER — BASAGLAR KWIKPEN 100 UNIT/ML ~~LOC~~ SOPN: PEN_INJECTOR | SUBCUTANEOUS | 99 refills | Status: DC

## 2016-02-05 MED ORDER — METFORMIN HCL 500 MG PO TABS: 500.0000 mg | ORAL_TABLET | Freq: Two times a day (BID) | ORAL | 3 refills | Status: DC

## 2016-02-05 MED ORDER — LOSARTAN POTASSIUM 50 MG PO TABS: ORAL_TABLET | ORAL | 3 refills | Status: DC

## 2016-02-05 MED ORDER — PEN NEEDLES 31G X 5 MM MISC: 1.0000 | Freq: Every day | 99 refills | Status: DC

## 2016-02-05 NOTE — Patient Instructions (Signed)
Start 5 units a day.  Add on 1 unit a day until AM sugar is below 150.  If 100-150 no change in dose.  If below 100, then cut back 1 unit.  Update me about sugar and dose in about 10 days, sooner if needed.  Call with questions.  Recheck A1c in about 3 months before a visit.  Take care.  Glad to see you.

## 2016-02-05 NOTE — Progress Notes (Signed)
Pre visit review using our clinic review tool, if applicable. No additional management support is needed unless otherwise documented below in the visit note. 

## 2016-02-05 NOTE — Progress Notes (Signed)
CPE deferred due to DM2.  Diabetes:  Using medications without difficulties:yes, diarrhea tolerable with metformin Hypoglycemic episodes:no sx Hyperglycemic episodes: no sx Feet problems: occ neuropathy sx- some tingling. This isn't consistent.   Blood Sugars averaging:not checked recently.   eye exam within last year:  yes Flu shot done 12/2015. At pharmacy.    Need refill for meloxicam for foot and hip pain. Off med recently.    PMH and SH reviewed  Meds, vitals, and allergies reviewed.   ROS: Per HPI unless specifically indicated in ROS section   GEN: nad, alert and oriented HEENT: mucous membranes moist NECK: supple w/o LA CV: rrr. PULM: ctab, no inc wob ABD: soft, +bs EXT: no edema SKIN: no acute rash  Diabetic foot exam: Normal inspection No skin breakdown No calluses  Normal DP pulses Normal sensation to light touch and monofilament Nails normal

## 2016-02-06 NOTE — Assessment & Plan Note (Signed)
Okay to restart meloxicam with routine cautions.

## 2016-02-06 NOTE — Assessment & Plan Note (Signed)
Insulin start and recent labs discussed with patient. Insulin pen demonstrated. Handout given with routine instructions for appropriate use of insulin pen.  She understood how to use it and thought she can start giving her first injections at home. I offered for her to come back to the office when she got her prescription filled to give her first dose here with extra coaching. She thought she can do this on her own and would call back as needed. In the meantime, start 5 units a day.  Add on 1 unit a day until AM sugar is below 150.  If 100-150 no change in dose.  If below 100, then cut back 1 unit.  Update me about sugar and dose in about 10 days, sooner if needed.  She can call if questions.  Recheck A1c in about 3 months before a visit.  She agrees.  Continue work on diet and exercise.  >25 minutes spent in face to face time with patient, >50% spent in counselling or coordination of care.

## 2016-02-19 ENCOUNTER — Other Ambulatory Visit: Payer: Self-pay | Admitting: Family Medicine

## 2016-03-07 ENCOUNTER — Other Ambulatory Visit: Payer: Self-pay | Admitting: Family Medicine

## 2016-04-01 ENCOUNTER — Encounter: Payer: Self-pay | Admitting: *Deleted

## 2016-04-01 NOTE — Telephone Encounter (Signed)
This encounter was created in error - please disregard.

## 2016-06-27 ENCOUNTER — Other Ambulatory Visit: Payer: Self-pay | Admitting: Family Medicine

## 2016-07-23 ENCOUNTER — Other Ambulatory Visit: Payer: Self-pay | Admitting: Family Medicine

## 2016-09-13 ENCOUNTER — Other Ambulatory Visit: Payer: Self-pay | Admitting: Family Medicine

## 2016-09-13 DIAGNOSIS — Z1231 Encounter for screening mammogram for malignant neoplasm of breast: Secondary | ICD-10-CM

## 2016-11-01 ENCOUNTER — Ambulatory Visit
Admission: RE | Admit: 2016-11-01 | Discharge: 2016-11-01 | Disposition: A | Discharge status: Home / Self Care | Payer: 59 | Source: Ambulatory Visit | Attending: Family Medicine | Admitting: Family Medicine

## 2016-11-01 DIAGNOSIS — Z1231 Encounter for screening mammogram for malignant neoplasm of breast: Secondary | ICD-10-CM | POA: Diagnosis present

## 2016-11-04 ENCOUNTER — Encounter: Payer: Self-pay | Admitting: *Deleted

## 2016-12-25 ENCOUNTER — Other Ambulatory Visit: Payer: Self-pay | Admitting: Family Medicine

## 2017-01-01 ENCOUNTER — Other Ambulatory Visit: Payer: Self-pay | Admitting: Family Medicine

## 2017-01-01 NOTE — Telephone Encounter (Signed)
Way overdue for DM2 f/u.   Call her and schedule labs and f/u OV.  Then send in the rx.  Thanks.

## 2017-01-01 NOTE — Telephone Encounter (Signed)
Last refill 02/05/16 #90 +3  Last OV 02/05/16  Ok to refill?

## 2017-01-02 ENCOUNTER — Other Ambulatory Visit: Payer: Self-pay | Admitting: Family Medicine

## 2017-01-02 ENCOUNTER — Encounter: Payer: Self-pay | Admitting: *Deleted

## 2017-01-02 NOTE — Telephone Encounter (Signed)
Dr. [name] says patient is way overdue for DM2 follow up.  Schedule OV with labs prior and refill once that is done per Dr. [name].

## 2017-01-02 NOTE — Telephone Encounter (Signed)
Tried to phone patient, no answer, no VM.  Letter mailed.

## 2017-01-07 ENCOUNTER — Encounter: Payer: Self-pay | Admitting: *Deleted

## 2017-01-09 NOTE — Telephone Encounter (Signed)
Unable to reach patient in numerous attempts, letter mailed.

## 2017-01-13 ENCOUNTER — Other Ambulatory Visit: Payer: Self-pay | Admitting: *Deleted

## 2017-01-13 MED ORDER — METFORMIN HCL 500 MG PO TABS: 500.0000 mg | ORAL_TABLET | Freq: Two times a day (BID) | ORAL | 0 refills | Status: DC

## 2017-01-13 MED ORDER — MELOXICAM 15 MG PO TABS: 15.0000 mg | ORAL_TABLET | Freq: Every day | ORAL | 0 refills | Status: DC | PRN

## 2017-01-13 MED ORDER — GLIMEPIRIDE 2 MG PO TABS: ORAL_TABLET | ORAL | 0 refills | Status: DC

## 2017-02-02 ENCOUNTER — Other Ambulatory Visit: Payer: Self-pay | Admitting: Family Medicine

## 2017-02-02 DIAGNOSIS — E1165 Type 2 diabetes mellitus with hyperglycemia: Secondary | ICD-10-CM

## 2017-02-06 ENCOUNTER — Encounter: Payer: Self-pay | Admitting: Family Medicine

## 2017-02-06 ENCOUNTER — Ambulatory Visit (INDEPENDENT_AMBULATORY_CARE_PROVIDER_SITE_OTHER): Payer: 59 | Admitting: Family Medicine

## 2017-02-06 ENCOUNTER — Other Ambulatory Visit (INDEPENDENT_AMBULATORY_CARE_PROVIDER_SITE_OTHER): Payer: 59

## 2017-02-06 VITALS — BP 160/88 | HR 102 | Temp 98.0°F | Ht 63.0 in | Wt 164.8 lb

## 2017-02-06 DIAGNOSIS — E1165 Type 2 diabetes mellitus with hyperglycemia: Secondary | ICD-10-CM

## 2017-02-06 DIAGNOSIS — Z0000 Encounter for general adult medical examination without abnormal findings: Secondary | ICD-10-CM | POA: Diagnosis not present

## 2017-02-06 DIAGNOSIS — Z1159 Encounter for screening for other viral diseases: Secondary | ICD-10-CM

## 2017-02-06 DIAGNOSIS — Z7189 Other specified counseling: Secondary | ICD-10-CM

## 2017-02-06 DIAGNOSIS — Z23 Encounter for immunization: Secondary | ICD-10-CM | POA: Diagnosis not present

## 2017-02-06 DIAGNOSIS — E785 Hyperlipidemia, unspecified: Secondary | ICD-10-CM

## 2017-02-06 DIAGNOSIS — Z114 Encounter for screening for human immunodeficiency virus [HIV]: Secondary | ICD-10-CM

## 2017-02-06 DIAGNOSIS — E119 Type 2 diabetes mellitus without complications: Secondary | ICD-10-CM

## 2017-02-06 DIAGNOSIS — I10 Essential (primary) hypertension: Secondary | ICD-10-CM

## 2017-02-06 LAB — COMPREHENSIVE METABOLIC PANEL
ALT: 43 U/L — AB (ref 0–35)
AST: 35 U/L (ref 0–37)
Albumin: 4.3 g/dL (ref 3.5–5.2)
Alkaline Phosphatase: 86 U/L (ref 39–117)
BILIRUBIN TOTAL: 0.6 mg/dL (ref 0.2–1.2)
BUN: 17 mg/dL (ref 6–23)
CALCIUM: 9.7 mg/dL (ref 8.4–10.5)
CO2: 27 meq/L (ref 19–32)
Chloride: 102 mEq/L (ref 96–112)
Creatinine, Ser: 0.77 mg/dL (ref 0.40–1.20)
GFR: 80.08 mL/min (ref >60.00)
Glucose, Bld: 244 mg/dL — ABNORMAL HIGH (ref 70–99)
Potassium: 3.9 mEq/L (ref 3.5–5.1)
Sodium: 136 mEq/L (ref 135–145)
Total Protein: 6.9 g/dL (ref 6.0–8.3)

## 2017-02-06 LAB — LIPID PANEL
CHOL/HDL RATIO: 5
Cholesterol: 162 mg/dL (ref 0–200)
HDL: 35.9 mg/dL — AB (ref >39.00)
LDL CALC: 97 mg/dL (ref 0–99)
NonHDL: 125.88
TRIGLYCERIDES: 142 mg/dL (ref 0.0–149.0)
VLDL: 28.4 mg/dL (ref 0.0–40.0)

## 2017-02-06 LAB — HEMOGLOBIN A1C: Hgb A1c MFr Bld: 9.1 % — ABNORMAL HIGH (ref 4.6–6.5)

## 2017-02-06 MED ORDER — BASAGLAR KWIKPEN 100 UNIT/ML ~~LOC~~ SOPN: PEN_INJECTOR | SUBCUTANEOUS | 3 refills | Status: DC

## 2017-02-06 MED ORDER — GLIMEPIRIDE 2 MG PO TABS: ORAL_TABLET | ORAL | 3 refills | Status: DC

## 2017-02-06 MED ORDER — SIMVASTATIN 40 MG PO TABS: 40.0000 mg | ORAL_TABLET | Freq: Every day | ORAL | 3 refills | Status: DC

## 2017-02-06 MED ORDER — METFORMIN HCL 500 MG PO TABS: 500.0000 mg | ORAL_TABLET | Freq: Two times a day (BID) | ORAL | 3 refills | Status: DC

## 2017-02-06 MED ORDER — MELOXICAM 15 MG PO TABS: 15.0000 mg | ORAL_TABLET | Freq: Every day | ORAL | 3 refills | Status: DC | PRN

## 2017-02-06 MED ORDER — LOSARTAN POTASSIUM 50 MG PO TABS: ORAL_TABLET | ORAL | 3 refills | Status: DC

## 2017-02-06 NOTE — Progress Notes (Signed)
CPE- See plan.  Routine anticipatory guidance given to patient.  See health maintenance.  The possibility exists that previously documented standard health maintenance information may have been brought forward from a previous encounter into this note.  If needed, that same information has been updated to reflect the current situation based on today's encounter.    Tetanus 2018 Flu done 2018 PNA UTD Shingles 2014 Mammogram 2018 DXA due at 89.   Pap not due with hx of hysterectomy.   Living will d/w pt.  Husband designated if patient were incapacitated.   Pt opts in for HCV screening.  D/w pt re: routine screening.   Pt opts in for HIV screening.  D/w pt re: routine screening.    Diabetes:  Using medications without difficulties:yes Hypoglycemic episodes:no sx Hyperglycemic episodes: no sx Feet problems: some pain but not from neuropathy.   Blood Sugars averaging: not checked often.  eye exam within last year: due, d/w pt.   Hypertension:    Using medication without problems or lightheadedness: yes Chest pain with exertion: no Edema:no Short of breath:no  Elevated Cholesterol: Using medications without problems:yes Muscle aches: not from statin Diet compliance: yes Exercise: yes  Still on meloxicam for her foot pain. She is still working.   PMH and SH reviewed  Meds, vitals, and allergies reviewed.   ROS: Per HPI.  Unless specifically indicated otherwise in HPI, the patient denies:  General: fever. Eyes: acute vision changes ENT: sore throat Cardiovascular: chest pain Respiratory: SOB GI: vomiting GU: dysuria Musculoskeletal: acute back pain Derm: acute rash Neuro: acute motor dysfunction Psych: worsening mood Endocrine: polydipsia Heme: bleeding Allergy: hayfever  GEN: nad, alert and oriented HEENT: mucous membranes moist NECK: supple w/o LA CV: rrr. PULM: ctab, no inc wob ABD: soft, +bs EXT: no edema SKIN: no acute rash  Diabetic foot exam: Normal  inspection No skin breakdown No calluses  Normal DP pulses Normal sensation to light touch and monofilament Nails normal

## 2017-02-06 NOTE — Patient Instructions (Signed)
Add 1 unit of insulin every few days until your AM sugar is less than 150.  If you have lows, then cut back by 1 unit as needed.  Recheck in about 3-4 months, labs ahead of time.  Take care.  Glad to see you.

## 2017-02-07 DIAGNOSIS — Z7189 Other specified counseling: Secondary | ICD-10-CM | POA: Insufficient documentation

## 2017-02-07 NOTE — Assessment & Plan Note (Signed)
Continue statin, labs d/w pt.  Continue work on diet and exercise.

## 2017-02-07 NOTE — Assessment & Plan Note (Signed)
  Tetanus 2018 Flu done 2018 PNA UTD Shingles 2014 Mammogram 2018 DXA due at 53.   Pap not due with hx of hysterectomy.   Living will d/w pt.  Husband designated if patient were incapacitated.   Pt opts in for HCV screening.  D/w pt re: routine screening.   Pt opts in for HIV screening.  D/w pt re: routine screening.

## 2017-02-07 NOTE — Assessment & Plan Note (Signed)
Continue ARB, labs d/w pt.  Continue work on diet and exercise.

## 2017-02-07 NOTE — Assessment & Plan Note (Signed)
Needs more frequent f/u.  Add 1 unit of insulin every few days until her AM sugar is less than 150.  If she has lows, then cut back by 1 unit as needed.  Recheck in about 3-4 months, labs ahead of time.  Continue work on diet and exercise.   Labs d/w pt.

## 2017-02-07 NOTE — Assessment & Plan Note (Signed)
Living will d/w pt.  Husband designated if patient were incapacitated.  

## 2017-04-23 LAB — HM DIABETES EYE EXAM

## 2017-04-29 ENCOUNTER — Encounter: Payer: Self-pay | Admitting: Family Medicine

## 2017-06-22 ENCOUNTER — Other Ambulatory Visit: Payer: Self-pay | Admitting: Family Medicine

## 2017-06-23 NOTE — Telephone Encounter (Signed)
Electronic refill request Last office visit 02/06/17 Last refill 02/06/17 #30/3

## 2017-06-24 NOTE — Telephone Encounter (Signed)
Sent. Thanks.   

## 2017-09-23 DIAGNOSIS — M7542 Impingement syndrome of left shoulder: Secondary | ICD-10-CM | POA: Diagnosis not present

## 2017-09-23 DIAGNOSIS — M5412 Radiculopathy, cervical region: Secondary | ICD-10-CM | POA: Insufficient documentation

## 2017-12-15 ENCOUNTER — Other Ambulatory Visit: Payer: Self-pay | Admitting: Family Medicine

## 2017-12-15 DIAGNOSIS — Z1231 Encounter for screening mammogram for malignant neoplasm of breast: Secondary | ICD-10-CM

## 2017-12-26 ENCOUNTER — Ambulatory Visit
Admission: RE | Admit: 2017-12-26 | Discharge: 2017-12-26 | Disposition: A | Discharge status: Home / Self Care | Payer: Medicare Other | Source: Ambulatory Visit | Attending: Family Medicine | Admitting: Family Medicine

## 2017-12-26 DIAGNOSIS — Z1231 Encounter for screening mammogram for malignant neoplasm of breast: Secondary | ICD-10-CM | POA: Diagnosis not present

## 2017-12-29 ENCOUNTER — Ambulatory Visit (INDEPENDENT_AMBULATORY_CARE_PROVIDER_SITE_OTHER): Payer: Medicare Other | Admitting: Family Medicine

## 2017-12-29 ENCOUNTER — Encounter: Payer: Self-pay | Admitting: Family Medicine

## 2017-12-29 VITALS — BP 122/70 | HR 108 | Temp 98.3°F | Ht 63.0 in | Wt 162.2 lb

## 2017-12-29 DIAGNOSIS — E119 Type 2 diabetes mellitus without complications: Secondary | ICD-10-CM | POA: Diagnosis not present

## 2017-12-29 DIAGNOSIS — N898 Other specified noninflammatory disorders of vagina: Secondary | ICD-10-CM | POA: Diagnosis not present

## 2017-12-29 DIAGNOSIS — E1165 Type 2 diabetes mellitus with hyperglycemia: Secondary | ICD-10-CM

## 2017-12-29 DIAGNOSIS — E785 Hyperlipidemia, unspecified: Secondary | ICD-10-CM | POA: Diagnosis not present

## 2017-12-29 DIAGNOSIS — Z7189 Other specified counseling: Secondary | ICD-10-CM | POA: Diagnosis not present

## 2017-12-29 DIAGNOSIS — Z0000 Encounter for general adult medical examination without abnormal findings: Secondary | ICD-10-CM

## 2017-12-29 DIAGNOSIS — Z23 Encounter for immunization: Secondary | ICD-10-CM | POA: Diagnosis not present

## 2017-12-29 DIAGNOSIS — R000 Tachycardia, unspecified: Secondary | ICD-10-CM

## 2017-12-29 DIAGNOSIS — Z780 Asymptomatic menopausal state: Secondary | ICD-10-CM

## 2017-12-29 DIAGNOSIS — I10 Essential (primary) hypertension: Secondary | ICD-10-CM | POA: Diagnosis not present

## 2017-12-29 DIAGNOSIS — Z029 Encounter for administrative examinations, unspecified: Secondary | ICD-10-CM

## 2017-12-29 DIAGNOSIS — Z1159 Encounter for screening for other viral diseases: Secondary | ICD-10-CM | POA: Diagnosis not present

## 2017-12-29 DIAGNOSIS — Z114 Encounter for screening for human immunodeficiency virus [HIV]: Secondary | ICD-10-CM

## 2017-12-29 LAB — COMPREHENSIVE METABOLIC PANEL
ALBUMIN: 4.5 g/dL (ref 3.5–5.2)
ALT: 30 U/L (ref 0–35)
AST: 21 U/L (ref 0–37)
Alkaline Phosphatase: 96 U/L (ref 39–117)
BUN: 18 mg/dL (ref 6–23)
CO2: 25 mEq/L (ref 19–32)
Calcium: 10 mg/dL (ref 8.4–10.5)
Chloride: 99 mEq/L (ref 96–112)
Creatinine, Ser: 0.84 mg/dL (ref 0.40–1.20)
GFR: 72.23 mL/min (ref >60.00)
Glucose, Bld: 330 mg/dL — ABNORMAL HIGH (ref 70–99)
Potassium: 4.8 mEq/L (ref 3.5–5.1)
Sodium: 134 mEq/L — ABNORMAL LOW (ref 135–145)
TOTAL PROTEIN: 7.2 g/dL (ref 6.0–8.3)
Total Bilirubin: 0.6 mg/dL (ref 0.2–1.2)

## 2017-12-29 LAB — HEMOGLOBIN A1C: HEMOGLOBIN A1C: 10.6 % — AB (ref 4.6–6.5)

## 2017-12-29 LAB — LIPID PANEL
CHOLESTEROL: 176 mg/dL (ref 0–200)
HDL: 34.7 mg/dL — ABNORMAL LOW (ref >39.00)
Total CHOL/HDL Ratio: 5
Triglycerides: 446 mg/dL — ABNORMAL HIGH (ref 0.0–149.0)

## 2017-12-29 LAB — LDL CHOLESTEROL, DIRECT: Direct LDL: 105 mg/dL

## 2017-12-29 MED ORDER — FLUCONAZOLE 150 MG PO TABS: 150.0000 mg | ORAL_TABLET | Freq: Once | ORAL | 0 refills | Status: AC

## 2017-12-29 NOTE — Progress Notes (Signed)
I have personally reviewed the Medicare Annual Wellness questionnaire and have noted 1. The patient's medical and social history 2. Their use of alcohol, tobacco or illicit drugs 3. Their current medications and supplements 4. The patient's functional ability including ADL's, fall risks, home safety risks and hearing or visual             impairment. 5. Diet and physical activities 6. Evidence for depression or mood disorders  The patients weight, height, BMI have been recorded in the chart and visual acuity is per eye clinic.  I have made referrals, counseling and provided education to the patient based review of the above and I have provided the pt with a written personalized care plan for preventive services.  Provider list updated- see scanned forms.  Routine anticipatory guidance given to patient.  See health maintenance. The possibility exists that previously documented standard health maintenance information may have been brought forward from a previous encounter into this note.  If needed, that same information has been updated to reflect the current situation based on today's encounter.    Flu 2019 Shingles out of stock, discussed with patient PNA 2019 Tetanus 2018 Colonoscopy 2010 Breast cancer screening -2019 Bone density test ordered 2019. Living will d/w pt.  Husband designated if patient were incapacitated. Cognitive function addressed- see scanned forms- and if abnormal then additional documentation follows.   She passed whisper hearing test. She has seen ortho about her shoulder and was treated with muscle relaxer but couldn't take it.  She'll check her old records and update me.    Vaginal irritation.  Noted in the last few weeks.  No discharge.  Some mild burning with urination but not consistently.  No FCNAVD.    Hypertension:    Using medication without problems or lightheadedness: yes Chest pain with exertion:no Edema:no Short of breath:no Labs pending.    Elevated Cholesterol: Using medications without problems:yes Muscle aches: not from statin Diet compliance: yes Exercise:yes  Recheck pulse 108- she was anxious about coming in today.  He doesn't notice her heart racing out of the clinic.  No CP, SOB.  EKG discussed with patient.  Diabetes:  Using medications without difficulties:yes Hypoglycemic episodes:no sx unless prolonged fasting Hyperglycemic episodes: no sx Feet problems: no tingling but but still with heel pain.   Blood Sugars averaging: not checked.   eye exam within last year:yes She had bruising on insulin injections.  She is off insulin.    PMH and SH reviewed  Meds, vitals, and allergies reviewed.   ROS: Per HPI.  Unless specifically indicated otherwise in HPI, the patient denies:  General: fever. Eyes: acute vision changes ENT: sore throat Cardiovascular: chest pain Respiratory: SOB GI: vomiting GU: dysuria Musculoskeletal: acute back pain Derm: acute rash Neuro: acute motor dysfunction Psych: worsening mood Endocrine: polydipsia Heme: bleeding Allergy: hayfever  GEN: nad, alert and oriented HEENT: mucous membranes moist NECK: supple w/o LA CV: rrr. PULM: ctab, no inc wob ABD: soft, +bs EXT: no edema SKIN: well perfused.   Diabetic foot exam: Normal inspection No skin breakdown No calluses  Normal DP pulses Normal sensation to light touch and monofilament Nails normal

## 2017-12-29 NOTE — Patient Instructions (Addendum)
We will call about your referral for the bone density test.  Rosaria Ferries or Azalee Course will call you if you don't see one of them on the way out.  Use diflucan in the meantime.  Hold the simvastatin when you use diflucan.   We'll contact you with your lab report. I can refill your meds when I see your labs.   Take care.  Glad to see you.  Update me as needed.   If your heart rate doesn't slow down then let me know.

## 2017-12-30 LAB — HIV ANTIBODY (ROUTINE TESTING W REFLEX): HIV 1&2 Ab, 4th Generation: NONREACTIVE

## 2017-12-30 LAB — HEPATITIS C ANTIBODY
Hepatitis C Ab: NONREACTIVE
SIGNAL TO CUT-OFF: 0.02 (ref <1.00)

## 2017-12-31 ENCOUNTER — Other Ambulatory Visit: Payer: Self-pay | Admitting: Family Medicine

## 2017-12-31 DIAGNOSIS — N898 Other specified noninflammatory disorders of vagina: Secondary | ICD-10-CM | POA: Insufficient documentation

## 2017-12-31 DIAGNOSIS — R000 Tachycardia, unspecified: Secondary | ICD-10-CM | POA: Insufficient documentation

## 2017-12-31 MED ORDER — GLIMEPIRIDE 2 MG PO TABS: ORAL_TABLET | ORAL | 3 refills | Status: DC

## 2017-12-31 MED ORDER — SIMVASTATIN 40 MG PO TABS: 40.0000 mg | ORAL_TABLET | Freq: Every day | ORAL | 3 refills | Status: DC

## 2017-12-31 MED ORDER — LOSARTAN POTASSIUM 50 MG PO TABS: ORAL_TABLET | ORAL | 3 refills | Status: DC

## 2017-12-31 MED ORDER — METFORMIN HCL 500 MG PO TABS: 500.0000 mg | ORAL_TABLET | Freq: Two times a day (BID) | ORAL | 3 refills | Status: DC

## 2017-12-31 NOTE — Assessment & Plan Note (Signed)
Flu 2019 Shingles out of stock, discussed with patient PNA 2019 Tetanus 2018 Colonoscopy 2010 Breast cancer screening -2019 Bone density test ordered 2019. Living will d/w pt.  Husband designated if patient were incapacitated. Cognitive function addressed- see scanned forms- and if abnormal then additional documentation follows.

## 2017-12-31 NOTE — Assessment & Plan Note (Signed)
Recheck labs pending.  See notes on labs.  Needs work on diet and exercise, discussed.

## 2017-12-31 NOTE — Assessment & Plan Note (Signed)
Recheck pulse 108- she was anxious about coming in today.  He doesn't notice her heart racing out of the clinic.  No CP, SOB.  EKG discussed with patient.  I want her to check her pulse out of the clinic and if she does not have resolution of symptoms then she will let me know.  At this point still okay for outpatient follow-up.

## 2017-12-31 NOTE — Assessment & Plan Note (Signed)
She can try 1 dose of Diflucan but she needs to hold simvastatin while taking that medication.  Discussed.

## 2017-12-31 NOTE — Assessment & Plan Note (Signed)
Off insulin currently. Recheck labs pending.  See notes on labs.  Needs work on diet and exercise, discussed.

## 2017-12-31 NOTE — Assessment & Plan Note (Signed)
Living will d/w pt.  Husband designated if patient were incapacitated.  

## 2018-01-01 ENCOUNTER — Other Ambulatory Visit: Payer: Self-pay | Admitting: Family Medicine

## 2018-01-01 DIAGNOSIS — E119 Type 2 diabetes mellitus without complications: Secondary | ICD-10-CM

## 2018-01-29 ENCOUNTER — Telehealth: Payer: Self-pay | Admitting: Family Medicine

## 2018-01-29 NOTE — Telephone Encounter (Signed)
Left message asking pt to call office regarding bone density.   °

## 2018-02-19 ENCOUNTER — Encounter: Payer: Self-pay | Admitting: Family Medicine

## 2018-03-23 DIAGNOSIS — I10 Essential (primary) hypertension: Secondary | ICD-10-CM | POA: Diagnosis not present

## 2018-03-23 DIAGNOSIS — E1159 Type 2 diabetes mellitus with other circulatory complications: Secondary | ICD-10-CM | POA: Diagnosis not present

## 2018-03-23 DIAGNOSIS — E1165 Type 2 diabetes mellitus with hyperglycemia: Secondary | ICD-10-CM | POA: Diagnosis not present

## 2018-03-23 DIAGNOSIS — E785 Hyperlipidemia, unspecified: Secondary | ICD-10-CM | POA: Diagnosis not present

## 2018-03-23 DIAGNOSIS — E1169 Type 2 diabetes mellitus with other specified complication: Secondary | ICD-10-CM | POA: Diagnosis not present

## 2018-04-14 ENCOUNTER — Ambulatory Visit
Admission: RE | Admit: 2018-04-14 | Discharge: 2018-04-14 | Disposition: A | Discharge status: Home / Self Care | Payer: Medicare Other | Source: Ambulatory Visit | Attending: Family Medicine | Admitting: Family Medicine

## 2018-04-14 DIAGNOSIS — Z780 Asymptomatic menopausal state: Secondary | ICD-10-CM | POA: Diagnosis not present

## 2018-04-14 DIAGNOSIS — M85852 Other specified disorders of bone density and structure, left thigh: Secondary | ICD-10-CM | POA: Diagnosis not present

## 2018-04-15 ENCOUNTER — Other Ambulatory Visit: Payer: Self-pay | Admitting: Family Medicine

## 2018-04-15 ENCOUNTER — Encounter: Payer: Self-pay | Admitting: Family Medicine

## 2018-04-15 DIAGNOSIS — J069 Acute upper respiratory infection, unspecified: Secondary | ICD-10-CM | POA: Diagnosis not present

## 2018-04-15 DIAGNOSIS — E7800 Pure hypercholesterolemia, unspecified: Secondary | ICD-10-CM | POA: Diagnosis not present

## 2018-04-15 DIAGNOSIS — I10 Essential (primary) hypertension: Secondary | ICD-10-CM | POA: Diagnosis not present

## 2018-04-15 DIAGNOSIS — E119 Type 2 diabetes mellitus without complications: Secondary | ICD-10-CM | POA: Diagnosis not present

## 2018-04-15 DIAGNOSIS — M8580 Other specified disorders of bone density and structure, unspecified site: Secondary | ICD-10-CM | POA: Insufficient documentation

## 2018-04-15 DIAGNOSIS — J018 Other acute sinusitis: Secondary | ICD-10-CM | POA: Diagnosis not present

## 2018-04-15 MED ORDER — VITAMIN D 50 MCG (2000 UT) PO TABS: 2000.0000 [IU] | ORAL_TABLET | Freq: Every day | ORAL | Status: AC

## 2018-04-17 ENCOUNTER — Other Ambulatory Visit (INDEPENDENT_AMBULATORY_CARE_PROVIDER_SITE_OTHER): Payer: Medicare Other

## 2018-04-17 DIAGNOSIS — M8580 Other specified disorders of bone density and structure, unspecified site: Secondary | ICD-10-CM

## 2018-04-17 LAB — VITAMIN D 25 HYDROXY (VIT D DEFICIENCY, FRACTURES): VITD: 39.94 ng/mL (ref 30.00–100.00)

## 2018-05-05 ENCOUNTER — Other Ambulatory Visit: Payer: Self-pay | Admitting: *Deleted

## 2018-05-05 MED ORDER — SIMVASTATIN 40 MG PO TABS: 40.0000 mg | ORAL_TABLET | Freq: Every day | ORAL | 2 refills | Status: DC

## 2018-05-05 MED ORDER — GLIMEPIRIDE 2 MG PO TABS: ORAL_TABLET | ORAL | 2 refills | Status: DC

## 2018-05-05 MED ORDER — METFORMIN HCL 500 MG PO TABS: 500.0000 mg | ORAL_TABLET | Freq: Two times a day (BID) | ORAL | 2 refills | Status: DC

## 2018-05-05 MED ORDER — LOSARTAN POTASSIUM 50 MG PO TABS: ORAL_TABLET | ORAL | 2 refills | Status: DC

## 2018-05-11 ENCOUNTER — Telehealth: Payer: Self-pay

## 2018-05-11 MED ORDER — FLUCONAZOLE 150 MG PO TABS: 150.0000 mg | ORAL_TABLET | Freq: Once | ORAL | 0 refills | Status: AC

## 2018-05-11 NOTE — Telephone Encounter (Signed)
Pt recently taken abx and request med for vaginal and perineal itching. No vaginal discharge. [facility].

## 2018-05-11 NOTE — Telephone Encounter (Signed)
Sent.  Thanks.  Hold simvastatin when taking diflucan.

## 2018-05-11 NOTE — Telephone Encounter (Signed)
Patient advised.

## 2018-07-13 DIAGNOSIS — Z8639 Personal history of other endocrine, nutritional and metabolic disease: Secondary | ICD-10-CM | POA: Diagnosis not present

## 2018-07-13 DIAGNOSIS — E1159 Type 2 diabetes mellitus with other circulatory complications: Secondary | ICD-10-CM | POA: Diagnosis not present

## 2018-07-13 DIAGNOSIS — E1165 Type 2 diabetes mellitus with hyperglycemia: Secondary | ICD-10-CM | POA: Diagnosis not present

## 2018-07-13 DIAGNOSIS — E1169 Type 2 diabetes mellitus with other specified complication: Secondary | ICD-10-CM | POA: Diagnosis not present

## 2018-07-13 DIAGNOSIS — I10 Essential (primary) hypertension: Secondary | ICD-10-CM | POA: Diagnosis not present

## 2018-08-27 DIAGNOSIS — I10 Essential (primary) hypertension: Secondary | ICD-10-CM | POA: Diagnosis not present

## 2018-08-27 DIAGNOSIS — E1169 Type 2 diabetes mellitus with other specified complication: Secondary | ICD-10-CM | POA: Diagnosis not present

## 2018-08-27 DIAGNOSIS — E1159 Type 2 diabetes mellitus with other circulatory complications: Secondary | ICD-10-CM | POA: Diagnosis not present

## 2018-08-27 DIAGNOSIS — Z1211 Encounter for screening for malignant neoplasm of colon: Secondary | ICD-10-CM | POA: Diagnosis not present

## 2018-08-27 DIAGNOSIS — R195 Other fecal abnormalities: Secondary | ICD-10-CM | POA: Diagnosis not present

## 2018-10-30 DIAGNOSIS — Z01812 Encounter for preprocedural laboratory examination: Secondary | ICD-10-CM | POA: Diagnosis not present

## 2018-11-04 DIAGNOSIS — I10 Essential (primary) hypertension: Secondary | ICD-10-CM | POA: Diagnosis not present

## 2018-11-04 DIAGNOSIS — E1169 Type 2 diabetes mellitus with other specified complication: Secondary | ICD-10-CM | POA: Diagnosis not present

## 2018-11-04 DIAGNOSIS — E1165 Type 2 diabetes mellitus with hyperglycemia: Secondary | ICD-10-CM | POA: Diagnosis not present

## 2018-11-04 DIAGNOSIS — E1159 Type 2 diabetes mellitus with other circulatory complications: Secondary | ICD-10-CM | POA: Diagnosis not present

## 2018-11-04 DIAGNOSIS — Z8639 Personal history of other endocrine, nutritional and metabolic disease: Secondary | ICD-10-CM | POA: Diagnosis not present

## 2018-11-06 ENCOUNTER — Encounter: Payer: Self-pay | Admitting: Family Medicine

## 2018-11-06 DIAGNOSIS — K640 First degree hemorrhoids: Secondary | ICD-10-CM | POA: Diagnosis not present

## 2018-11-06 DIAGNOSIS — D120 Benign neoplasm of cecum: Secondary | ICD-10-CM | POA: Diagnosis not present

## 2018-11-06 DIAGNOSIS — K635 Polyp of colon: Secondary | ICD-10-CM | POA: Diagnosis not present

## 2018-11-06 DIAGNOSIS — D123 Benign neoplasm of transverse colon: Secondary | ICD-10-CM | POA: Diagnosis not present

## 2018-11-06 DIAGNOSIS — Z1211 Encounter for screening for malignant neoplasm of colon: Secondary | ICD-10-CM | POA: Diagnosis not present

## 2018-11-06 LAB — HM COLONOSCOPY

## 2018-11-16 ENCOUNTER — Other Ambulatory Visit: Payer: Self-pay | Admitting: Family Medicine

## 2018-11-27 ENCOUNTER — Other Ambulatory Visit: Payer: Self-pay | Admitting: Family Medicine

## 2018-11-27 DIAGNOSIS — Z1231 Encounter for screening mammogram for malignant neoplasm of breast: Secondary | ICD-10-CM

## 2018-12-01 LAB — HM DIABETES EYE EXAM

## 2018-12-30 ENCOUNTER — Ambulatory Visit
Admission: RE | Admit: 2018-12-30 | Discharge: 2018-12-30 | Disposition: A | Discharge status: Home / Self Care | Payer: Medicare Other | Source: Ambulatory Visit | Attending: Family Medicine | Admitting: Family Medicine

## 2018-12-30 DIAGNOSIS — Z1231 Encounter for screening mammogram for malignant neoplasm of breast: Secondary | ICD-10-CM | POA: Insufficient documentation

## 2019-01-04 ENCOUNTER — Telehealth: Payer: Self-pay

## 2019-01-04 NOTE — Telephone Encounter (Signed)
Patient left a message on triage line asking if she should get Shingrix vaccine-she had the other shingles vaccine in 2014. Also wants to know if she needs to get a Pneumovax vaccine. Please review.

## 2019-01-05 ENCOUNTER — Encounter: Payer: Self-pay | Admitting: Family Medicine

## 2019-01-05 LAB — LAB REPORT - SCANNED
A1c: 7.5
A1c: 7.8

## 2019-01-05 NOTE — Telephone Encounter (Signed)
She should check with insurance to see if Shingrix is covered better at the clinic or at the pharmacy. She should get a repeat dose on Pneumovax/pneumonia 23. I see that she already got a flu shot.  Thanks.

## 2019-01-05 NOTE — Telephone Encounter (Signed)
Patient advised.

## 2019-02-01 ENCOUNTER — Other Ambulatory Visit: Payer: Self-pay | Admitting: Family Medicine

## 2019-04-21 DIAGNOSIS — E785 Hyperlipidemia, unspecified: Secondary | ICD-10-CM | POA: Diagnosis not present

## 2019-04-21 DIAGNOSIS — E1165 Type 2 diabetes mellitus with hyperglycemia: Secondary | ICD-10-CM | POA: Diagnosis not present

## 2019-04-21 DIAGNOSIS — E1169 Type 2 diabetes mellitus with other specified complication: Secondary | ICD-10-CM | POA: Diagnosis not present

## 2019-04-21 DIAGNOSIS — E559 Vitamin D deficiency, unspecified: Secondary | ICD-10-CM | POA: Diagnosis not present

## 2019-04-21 DIAGNOSIS — Z8639 Personal history of other endocrine, nutritional and metabolic disease: Secondary | ICD-10-CM | POA: Diagnosis not present

## 2019-04-28 DIAGNOSIS — E1159 Type 2 diabetes mellitus with other circulatory complications: Secondary | ICD-10-CM | POA: Diagnosis not present

## 2019-04-28 DIAGNOSIS — Z8639 Personal history of other endocrine, nutritional and metabolic disease: Secondary | ICD-10-CM | POA: Diagnosis not present

## 2019-04-28 DIAGNOSIS — I10 Essential (primary) hypertension: Secondary | ICD-10-CM | POA: Diagnosis not present

## 2019-04-28 DIAGNOSIS — E1165 Type 2 diabetes mellitus with hyperglycemia: Secondary | ICD-10-CM | POA: Diagnosis not present

## 2019-04-28 DIAGNOSIS — E1169 Type 2 diabetes mellitus with other specified complication: Secondary | ICD-10-CM | POA: Diagnosis not present

## 2019-07-07 DIAGNOSIS — E119 Type 2 diabetes mellitus without complications: Secondary | ICD-10-CM | POA: Diagnosis not present

## 2019-07-07 DIAGNOSIS — H40003 Preglaucoma, unspecified, bilateral: Secondary | ICD-10-CM | POA: Diagnosis not present

## 2019-07-07 LAB — HM DIABETES EYE EXAM

## 2019-07-28 DIAGNOSIS — E1159 Type 2 diabetes mellitus with other circulatory complications: Secondary | ICD-10-CM | POA: Diagnosis not present

## 2019-07-28 DIAGNOSIS — Z8639 Personal history of other endocrine, nutritional and metabolic disease: Secondary | ICD-10-CM | POA: Diagnosis not present

## 2019-07-28 DIAGNOSIS — E1169 Type 2 diabetes mellitus with other specified complication: Secondary | ICD-10-CM | POA: Diagnosis not present

## 2019-07-28 DIAGNOSIS — I152 Hypertension secondary to endocrine disorders: Secondary | ICD-10-CM | POA: Diagnosis not present

## 2019-07-28 DIAGNOSIS — E1165 Type 2 diabetes mellitus with hyperglycemia: Secondary | ICD-10-CM | POA: Diagnosis not present

## 2019-08-06 DIAGNOSIS — H40003 Preglaucoma, unspecified, bilateral: Secondary | ICD-10-CM | POA: Diagnosis not present

## 2019-08-10 ENCOUNTER — Encounter: Payer: Self-pay | Admitting: Family Medicine

## 2019-08-19 DIAGNOSIS — H40003 Preglaucoma, unspecified, bilateral: Secondary | ICD-10-CM | POA: Diagnosis not present

## 2019-09-23 DIAGNOSIS — H40003 Preglaucoma, unspecified, bilateral: Secondary | ICD-10-CM | POA: Diagnosis not present

## 2019-11-01 DIAGNOSIS — Z20822 Contact with and (suspected) exposure to covid-19: Secondary | ICD-10-CM | POA: Diagnosis not present

## 2019-11-01 DIAGNOSIS — J0100 Acute maxillary sinusitis, unspecified: Secondary | ICD-10-CM | POA: Diagnosis not present

## 2019-11-11 DIAGNOSIS — E1159 Type 2 diabetes mellitus with other circulatory complications: Secondary | ICD-10-CM | POA: Diagnosis not present

## 2019-11-11 DIAGNOSIS — E1165 Type 2 diabetes mellitus with hyperglycemia: Secondary | ICD-10-CM | POA: Diagnosis not present

## 2019-11-11 DIAGNOSIS — I152 Hypertension secondary to endocrine disorders: Secondary | ICD-10-CM | POA: Diagnosis not present

## 2019-11-11 DIAGNOSIS — Z8639 Personal history of other endocrine, nutritional and metabolic disease: Secondary | ICD-10-CM | POA: Diagnosis not present

## 2019-11-11 DIAGNOSIS — E1169 Type 2 diabetes mellitus with other specified complication: Secondary | ICD-10-CM | POA: Diagnosis not present

## 2019-11-11 LAB — HEMOGLOBIN A1C: Hemoglobin A1C: 9.8

## 2019-11-29 ENCOUNTER — Other Ambulatory Visit: Payer: Self-pay | Admitting: Family Medicine

## 2019-12-06 ENCOUNTER — Telehealth: Payer: Self-pay | Admitting: Family Medicine

## 2019-12-07 NOTE — Telephone Encounter (Signed)
Patient has not been seen since 2019. Electronic refill request. Losartan Last office visit:   12/29/2017 Last Filled:    90 tablet 3 02/01/2019   Electronic refill request. Simvastatin Last office visit:   12/29/2017 Last Filled:   90 tablet 3 02/01/2019    No upcoming appointments scheduled.

## 2019-12-08 NOTE — Telephone Encounter (Signed)
Please call and schedule appointment as instructed. 

## 2019-12-08 NOTE — Telephone Encounter (Signed)
Sent. Thanks.  Needs yearly visit scheduled.

## 2019-12-09 NOTE — Telephone Encounter (Signed)
PATIENT SCHEDULED FOR APPT 12/20.

## 2020-02-28 ENCOUNTER — Encounter: Payer: Self-pay | Admitting: Family Medicine

## 2020-02-28 ENCOUNTER — Other Ambulatory Visit: Payer: Self-pay

## 2020-02-28 ENCOUNTER — Ambulatory Visit (INDEPENDENT_AMBULATORY_CARE_PROVIDER_SITE_OTHER): Payer: Medicare Other | Admitting: Family Medicine

## 2020-02-28 VITALS — BP 127/82 | HR 96 | Temp 96.2°F | Ht 64.0 in | Wt 161.7 lb

## 2020-02-28 DIAGNOSIS — I10 Essential (primary) hypertension: Secondary | ICD-10-CM

## 2020-02-28 DIAGNOSIS — E785 Hyperlipidemia, unspecified: Secondary | ICD-10-CM

## 2020-02-28 DIAGNOSIS — Z0000 Encounter for general adult medical examination without abnormal findings: Secondary | ICD-10-CM

## 2020-02-28 DIAGNOSIS — Z7189 Other specified counseling: Secondary | ICD-10-CM

## 2020-02-28 DIAGNOSIS — E1165 Type 2 diabetes mellitus with hyperglycemia: Secondary | ICD-10-CM

## 2020-02-28 DIAGNOSIS — Z1231 Encounter for screening mammogram for malignant neoplasm of breast: Secondary | ICD-10-CM

## 2020-02-28 MED ORDER — LOSARTAN POTASSIUM 50 MG PO TABS: 50.0000 mg | ORAL_TABLET | Freq: Every day | ORAL | 3 refills | Status: DC

## 2020-02-28 MED ORDER — METFORMIN HCL 500 MG PO TABS: 1000.0000 mg | ORAL_TABLET | Freq: Two times a day (BID) | ORAL | Status: DC

## 2020-02-28 NOTE — Patient Instructions (Addendum)
Call about a mammogram.  Stop the simvastatin and see if the aches get better. Update me in about two weeks either way. Try heat vs ice in the meantime.   Take care.  Glad to see you.

## 2020-02-28 NOTE — Progress Notes (Signed)
This visit occurred during the SARS-CoV-2 public health emergency.  Safety protocols were in place, including screening questions prior to the visit, additional usage of staff PPE, and extensive cleaning of exam room while observing appropriate contact time as indicated for disinfecting solutions.  covid booster 2021 Flu 2021 shingrix up to date PNA vaccine d/w pt.  Tetanus 2018.   Mammogram pending.   DXA 2020.   Living will d/w pt. Husband designated if patient were incapacitated Colonoscopy 2020  Dm per endocrine.  I'll defer.  She agrees.    Elevated Cholesterol: Using medications without problems: yes Muscle aches: she has R lower back and L upper back soreness but some better today.     Diet compliance: encouraged.  She cut out soda, is drinking more water.   Exercise: yes  Hypertension:    Using medication without problems or lightheadedness: yes Chest pain with exertion:no Edema:no Short of breath:no BP lower at home, 127/82.  D/w pt.   She has white coat reaction here.    PMH and SH reviewed Meds, vitals, and allergies reviewed.   ROS: Per HPI unless specifically indicated in ROS section   GEN: nad, alert and oriented HEENT: ncat NECK: supple w/o LA CV: rrr. PULM: ctab, no inc wob ABD: soft, +bs EXT: no edema SKIN: no acute rash Midline back not ttp.  R SLR neg

## 2020-02-29 ENCOUNTER — Other Ambulatory Visit: Payer: Self-pay | Admitting: Family Medicine

## 2020-03-01 DIAGNOSIS — Z0000 Encounter for general adult medical examination without abnormal findings: Secondary | ICD-10-CM | POA: Insufficient documentation

## 2020-03-01 NOTE — Assessment & Plan Note (Signed)
Discussed with patient about possible muscle strain versus statin aches.  She is feeling better today.  We talked about options.  She can hold simvastatin for 2 weeks and see if she clearly feels better and then update me as needed.  Routine cautions given to patient.

## 2020-03-01 NOTE — Assessment & Plan Note (Signed)
Living will d/w pt.  Husband designated if patient were incapacitated.  

## 2020-03-01 NOTE — Telephone Encounter (Signed)
Pharmacy requests refill on: Simvastatin 40 mg   LAST REFILL: 12/08/2019 LAST OV: 02/28/2020 NEXT OV: Not Scheduled  PHARMACY: [place], [place]

## 2020-03-01 NOTE — Assessment & Plan Note (Signed)
covid booster 2021 Flu 2021 shingrix up to date PNA vaccine d/w pt.  Tetanus 2018.   Mammogram pending.   DXA 2020.   Living will d/w pt. Husband designated if patient were incapacitated Colonoscopy 2020

## 2020-03-01 NOTE — Assessment & Plan Note (Signed)
Blood pressure is lower at home.  Continue losartan.  Update me as needed.  She likely has a whitecoat reaction.

## 2020-03-01 NOTE — Assessment & Plan Note (Signed)
Dm per endocrine.  I'll defer.  She agrees.

## 2020-03-06 ENCOUNTER — Ambulatory Visit
Admission: RE | Admit: 2020-03-06 | Discharge: 2020-03-06 | Disposition: A | Discharge status: Home / Self Care | Payer: Medicare Other | Source: Ambulatory Visit | Attending: Family Medicine | Admitting: Family Medicine

## 2020-03-06 ENCOUNTER — Other Ambulatory Visit: Payer: Self-pay

## 2020-03-06 DIAGNOSIS — Z1231 Encounter for screening mammogram for malignant neoplasm of breast: Secondary | ICD-10-CM | POA: Diagnosis not present

## 2020-03-09 ENCOUNTER — Telehealth: Payer: Self-pay | Admitting: Family Medicine

## 2020-03-09 NOTE — Telephone Encounter (Signed)
Error

## 2020-03-12 ENCOUNTER — Encounter: Payer: Self-pay | Admitting: Family Medicine

## 2020-03-22 DIAGNOSIS — E119 Type 2 diabetes mellitus without complications: Secondary | ICD-10-CM | POA: Diagnosis not present

## 2020-03-22 LAB — HM DIABETES EYE EXAM

## 2020-04-11 ENCOUNTER — Telehealth: Payer: Self-pay

## 2020-04-11 NOTE — Telephone Encounter (Signed)
-----   Message from Pete Pelt, LPN sent at 9/52/8413  2:13 PM EST ----- Regarding: RE: Pneumonia vaccine history Hi, I spoke to [facility] at [facility] and was advised that they show that they gave the patient a pneumonia 13 on 01/05/19. Melissa stated that they do not have any record of ever giving her a pneumonia 70. Regina ----- Message ----- From: Randall An, RN Sent: 04/07/2020  12:02 PM EST To: Pete Pelt, LPN Subject: FW: Pneumonia vaccine history                  Can you contact the pts pharmacy and try to find out about the last pneumo vaccine the pt got.  See if the one in the chart is documented correctly. Thanks, Let me know what you find out. Larene Beach  ----- Message ----- From: Tonia Ghent, MD Sent: 03/27/2020   8:58 PM EST To: Randall An, RN Subject: FW: Pneumonia vaccine history                  Please talk to me about this vaccine entry.  Thanks.   Brigitte Pulse  ----- Message ----- From: Casandra Doffing, CMA Sent: 03/13/2020   1:27 PM EST To: Tonia Ghent, MD Subject: FW: Pneumonia vaccine history                   ----- Message ----- From: Ronna Polio, RN Sent: 03/13/2020   8:25 AM EST To: Casandra Doffing, CMA Subject: FW: Pneumonia vaccine history                   ----- Message ----- From: Tonia Ghent, MD Sent: 03/12/2020   4:38 PM EST To: Ronna Polio, RN Subject: Pneumonia vaccine history                      Please talk to me about this patient.  I suspect her most recent entry for pneumonia-13 vaccination should really be a pneumonia-23.  I think this needs to get updated in the EMR.  Thanks.

## 2020-04-11 NOTE — Telephone Encounter (Signed)
Documenting staff msg from provider and f/u by Emelia Salisbury

## 2020-05-04 DIAGNOSIS — E1165 Type 2 diabetes mellitus with hyperglycemia: Secondary | ICD-10-CM | POA: Diagnosis not present

## 2020-05-04 DIAGNOSIS — I152 Hypertension secondary to endocrine disorders: Secondary | ICD-10-CM | POA: Diagnosis not present

## 2020-05-04 DIAGNOSIS — Z8639 Personal history of other endocrine, nutritional and metabolic disease: Secondary | ICD-10-CM | POA: Diagnosis not present

## 2020-05-04 DIAGNOSIS — E1159 Type 2 diabetes mellitus with other circulatory complications: Secondary | ICD-10-CM | POA: Diagnosis not present

## 2020-05-04 DIAGNOSIS — E785 Hyperlipidemia, unspecified: Secondary | ICD-10-CM | POA: Diagnosis not present

## 2020-05-04 DIAGNOSIS — E1169 Type 2 diabetes mellitus with other specified complication: Secondary | ICD-10-CM | POA: Diagnosis not present

## 2020-07-11 DIAGNOSIS — Z20822 Contact with and (suspected) exposure to covid-19: Secondary | ICD-10-CM | POA: Diagnosis not present

## 2020-07-11 DIAGNOSIS — R519 Headache, unspecified: Secondary | ICD-10-CM | POA: Diagnosis not present

## 2020-07-11 DIAGNOSIS — J0140 Acute pansinusitis, unspecified: Secondary | ICD-10-CM | POA: Diagnosis not present

## 2020-08-17 ENCOUNTER — Other Ambulatory Visit: Payer: Self-pay | Admitting: Family Medicine

## 2020-08-17 DIAGNOSIS — I152 Hypertension secondary to endocrine disorders: Secondary | ICD-10-CM | POA: Diagnosis not present

## 2020-08-17 DIAGNOSIS — E1165 Type 2 diabetes mellitus with hyperglycemia: Secondary | ICD-10-CM | POA: Diagnosis not present

## 2020-08-17 DIAGNOSIS — E785 Hyperlipidemia, unspecified: Secondary | ICD-10-CM | POA: Diagnosis not present

## 2020-08-17 DIAGNOSIS — E1159 Type 2 diabetes mellitus with other circulatory complications: Secondary | ICD-10-CM | POA: Diagnosis not present

## 2020-08-17 DIAGNOSIS — E1169 Type 2 diabetes mellitus with other specified complication: Secondary | ICD-10-CM | POA: Diagnosis not present

## 2020-08-17 DIAGNOSIS — Z8639 Personal history of other endocrine, nutritional and metabolic disease: Secondary | ICD-10-CM | POA: Diagnosis not present

## 2020-08-21 DIAGNOSIS — H40003 Preglaucoma, unspecified, bilateral: Secondary | ICD-10-CM | POA: Diagnosis not present

## 2020-08-30 DIAGNOSIS — E119 Type 2 diabetes mellitus without complications: Secondary | ICD-10-CM | POA: Diagnosis not present

## 2021-01-23 ENCOUNTER — Ambulatory Visit (INDEPENDENT_AMBULATORY_CARE_PROVIDER_SITE_OTHER): Payer: Medicare Other

## 2021-01-23 VITALS — Ht 64.0 in | Wt 161.0 lb

## 2021-01-23 DIAGNOSIS — Z1231 Encounter for screening mammogram for malignant neoplasm of breast: Secondary | ICD-10-CM

## 2021-01-23 DIAGNOSIS — Z0000 Encounter for general adult medical examination without abnormal findings: Secondary | ICD-10-CM | POA: Diagnosis not present

## 2021-01-23 DIAGNOSIS — Z780 Asymptomatic menopausal state: Secondary | ICD-10-CM | POA: Diagnosis not present

## 2021-01-23 NOTE — Patient Instructions (Signed)
Deborah Alvarado , Thank you for taking time to complete your Medicare Wellness Visit. I appreciate your ongoing commitment to your health goals. Please review the following plan we discussed and let me know if I can assist you in the future.   Screening recommendations/referrals: Colonoscopy: up to date completed 11/06/18, due 11/06/23 Mammogram: due, last exam 03/06/20, someone will call to schedule Bone Density: due, last exam 04/14/18, someone will call to schedule Recommended yearly ophthalmology/optometry visit for glaucoma screening and checkup Recommended yearly dental visit for hygiene and checkup  Vaccinations: Influenza vaccine: up to date, please bring vaccine information for your chart Pneumococcal vaccine: up to date Tdap vaccine: up to date , completed 02/06/17, due 02/07/27 Shingles vaccine: up to date   Covid-19: up to date  Advanced directives: Please bring a copy of Living Will and/or Hospers for your chart.   Conditions/risks identified: see problem list  Next appointment: Follow up in one year for your annual wellness visit 01/29/22 @ 9:00am, this will be a telephone visit   Preventive Care 65 Years and Older, Female Preventive care refers to lifestyle choices and visits with your health care provider that can promote health and wellness. What does preventive care include? A yearly physical exam. This is also called an annual well check. Dental exams once or twice a year. Routine eye exams. Ask your health care provider how often you should have your eyes checked. Personal lifestyle choices, including: Daily care of your teeth and gums. Regular physical activity. Eating a healthy diet. Avoiding tobacco and drug use. Limiting alcohol use. Practicing safe sex. Taking low-dose aspirin every day. Taking vitamin and mineral supplements as recommended by your health care provider. What happens during an annual well check? The services and screenings  done by your health care provider during your annual well check will depend on your age, overall health, lifestyle risk factors, and family history of disease. Counseling  Your health care provider may ask you questions about your: Alcohol use. Tobacco use. Drug use. Emotional well-being. Home and relationship well-being. Sexual activity. Eating habits. History of falls. Memory and ability to understand (cognition). Work and work Statistician. Reproductive health. Screening  You may have the following tests or measurements: Height, weight, and BMI. Blood pressure. Lipid and cholesterol levels. These may be checked every 5 years, or more frequently if you are over 80 years old. Skin check. Lung cancer screening. You may have this screening every year starting at age 57 if you have a 30-pack-year history of smoking and currently smoke or have quit within the past 15 years. Fecal occult blood test (FOBT) of the stool. You may have this test every year starting at age 29. Flexible sigmoidoscopy or colonoscopy. You may have a sigmoidoscopy every 5 years or a colonoscopy every 10 years starting at age 72. Hepatitis C blood test. Hepatitis B blood test. Sexually transmitted disease (STD) testing. Diabetes screening. This is done by checking your blood sugar (glucose) after you have not eaten for a while (fasting). You may have this done every 1-3 years. Bone density scan. This is done to screen for osteoporosis. You may have this done starting at age 29. Mammogram. This may be done every 1-2 years. Talk to your health care provider about how often you should have regular mammograms. Talk with your health care provider about your test results, treatment options, and if necessary, the need for more tests. Vaccines  Your health care provider may recommend certain vaccines, such  as: Influenza vaccine. This is recommended every year. Tetanus, diphtheria, and acellular pertussis (Tdap, Td)  vaccine. You may need a Td booster every 10 years. Zoster vaccine. You may need this after age 49. Pneumococcal 13-valent conjugate (PCV13) vaccine. One dose is recommended after age 26. Pneumococcal polysaccharide (PPSV23) vaccine. One dose is recommended after age 31. Talk to your health care provider about which screenings and vaccines you need and how often you need them. This information is not intended to replace advice given to you by your health care provider. Make sure you discuss any questions you have with your health care provider. Document Released: 03/24/2015 Document Revised: 11/15/2015 Document Reviewed: 12/27/2014 Elsevier Interactive Patient Education  2017 [place] Prevention in the Home Falls can cause injuries. They can happen to people of all ages. There are many things you can do to make your home safe and to help prevent falls. What can I do on the outside of my home? Regularly fix the edges of walkways and driveways and fix any cracks. Remove anything that might make you trip as you walk through a door, such as a raised step or threshold. Trim any bushes or trees on the path to your home. Use bright outdoor lighting. Clear any walking paths of anything that might make someone trip, such as rocks or tools. Regularly check to see if handrails are loose or broken. Make sure that both sides of any steps have handrails. Any raised decks and porches should have guardrails on the edges. Have any leaves, snow, or ice cleared regularly. Use sand or salt on walking paths during winter. Clean up any spills in your garage right away. This includes oil or grease spills. What can I do in the bathroom? Use night lights. Install grab bars by the toilet and in the tub and shower. Do not use towel bars as grab bars. Use non-skid mats or decals in the tub or shower. If you need to sit down in the shower, use a plastic, non-slip stool. Keep the floor dry. Clean up any  water that spills on the floor as soon as it happens. Remove soap buildup in the tub or shower regularly. Attach bath mats securely with double-sided non-slip rug tape. Do not have throw rugs and other things on the floor that can make you trip. What can I do in the bedroom? Use night lights. Make sure that you have a light by your bed that is easy to reach. Do not use any sheets or blankets that are too big for your bed. They should not hang down onto the floor. Have a firm chair that has side arms. You can use this for support while you get dressed. Do not have throw rugs and other things on the floor that can make you trip. What can I do in the kitchen? Clean up any spills right away. Avoid walking on wet floors. Keep items that you use a lot in easy-to-reach places. If you need to reach something above you, use a strong step stool that has a grab bar. Keep electrical cords out of the way. Do not use floor polish or wax that makes floors slippery. If you must use wax, use non-skid floor wax. Do not have throw rugs and other things on the floor that can make you trip. What can I do with my stairs? Do not leave any items on the stairs. Make sure that there are handrails on both sides of the stairs and use  them. Fix handrails that are broken or loose. Make sure that handrails are as long as the stairways. Check any carpeting to make sure that it is firmly attached to the stairs. Fix any carpet that is loose or worn. Avoid having throw rugs at the top or bottom of the stairs. If you do have throw rugs, attach them to the floor with carpet tape. Make sure that you have a light switch at the top of the stairs and the bottom of the stairs. If you do not have them, ask someone to add them for you. What else can I do to help prevent falls? Wear shoes that: Do not have high heels. Have rubber bottoms. Are comfortable and fit you well. Are closed at the toe. Do not wear sandals. If you use a  stepladder: Make sure that it is fully opened. Do not climb a closed stepladder. Make sure that both sides of the stepladder are locked into place. Ask someone to hold it for you, if possible. Clearly mark and make sure that you can see: Any grab bars or handrails. First and last steps. Where the edge of each step is. Use tools that help you move around (mobility aids) if they are needed. These include: Canes. Walkers. Scooters. Crutches. Turn on the lights when you go into a dark area. Replace any light bulbs as soon as they burn out. Set up your furniture so you have a clear path. Avoid moving your furniture around. If any of your floors are uneven, fix them. If there are any pets around you, be aware of where they are. Review your medicines with your doctor. Some medicines can make you feel dizzy. This can increase your chance of falling. Ask your doctor what other things that you can do to help prevent falls. This information is not intended to replace advice given to you by your health care provider. Make sure you discuss any questions you have with your health care provider. Document Released: 12/22/2008 Document Revised: 08/03/2015 Document Reviewed: 04/01/2014 Elsevier Interactive Patient Education  2017 [employer].

## 2021-01-23 NOTE — Progress Notes (Signed)
Subjective:   Deborah Alvarado is a 70 y.o. female who presents for an Initial Medicare Annual Wellness Visit.  I connected with Gerrie Nordmann today by telephone and verified that I am speaking with the correct person using two identifiers. Location patient: home Location provider: work Persons participating in the virtual visit: patient, Marine scientist.    I discussed the limitations, risks, security and privacy concerns of performing an evaluation and management service by telephone and the availability of in person appointments. I also discussed with the patient that there may be a patient responsible charge related to this service. The patient expressed understanding and verbally consented to this telephonic visit.    Interactive audio and video telecommunications were attempted between this provider and patient, however failed, due to patient having technical difficulties OR patient did not have access to video capability.  We continued and completed visit with audio only.  Some vital signs may be absent or patient reported.   Time Spent with patient on telephone encounter: 30 minutes  Review of Systems     Cardiac Risk Factors include: diabetes mellitus;advanced age (>49men, >9 women);dyslipidemia;hypertension     Objective:    Today's Vitals   01/23/21 1323  Weight: 161 lb (73 kg)  Height: 5\' 4"  (1.626 m)   Body mass index is 27.64 kg/m.  Advanced Directives 01/23/2021  Does Patient Have a Medical Advance Directive? Yes  Type of Paramedic of [place];Living will  Does patient want to make changes to medical advance directive? Yes (MAU/Ambulatory/Procedural Areas - Information given)    Current Medications (verified) Outpatient Encounter Medications as of 01/23/2021  Medication Sig   B-D UF III MINI PEN NEEDLES 31G X 5 MM MISC INJECT 1 DEVICE AS DIRECTED DAILY. USE WITH INSULIN PEN   Cholecalciferol (VITAMIN D) 50 MCG (2000 UT) tablet Take 1 tablet  (2,000 Units total) by mouth daily.   Dulaglutide (TRULICITY) 1.5 QP/5.9FM SOPN Inject into the skin.   glimepiride (AMARYL) 2 MG tablet TAKE 1 TABLET (2 MG TOTAL) BY MOUTH 2 (TWO) TIMES DAILY.   losartan (COZAAR) 50 MG tablet Take 1 tablet (50 mg total) by mouth daily.   metFORMIN (GLUCOPHAGE) 500 MG tablet Take 2 tablets (1,000 mg total) by mouth 2 (two) times daily with a meal.   simvastatin (ZOCOR) 40 MG tablet TAKE 1 TABLET BY MOUTH AT  BEDTIME   No facility-administered encounter medications on file as of 01/23/2021.    Allergies (verified) Lisinopril and Metformin and related   History: Past Medical History:  Diagnosis Date   Allergy    Anxiety    Diabetes mellitus    Type II   Hyperlipidemia    Hypertension    NSVD (normal spontaneous vaginal delivery)    x 2   Past Surgical History:  Procedure Laterality Date   TOTAL ABDOMINAL HYSTERECTOMY  01/2005   due to fibroids   Family History  Problem Relation Age of Onset   Cancer Mother        Leukemia   Diabetes Brother    Breast cancer Maternal Grandmother    Colon cancer Neg Hx    Social History   Socioeconomic History   Marital status: Married    Spouse name: Not on file   Number of children: 2   Years of education: Not on file   Highest education level: Not on file  Occupational History    Employer: [employer]  Tobacco Use   Smoking status: Never  Smokeless tobacco: Never  Substance and Sexual Activity   Alcohol use: No    Alcohol/week: 0.0 standard drinks   Drug use: No   Sexual activity: Not on file  Other Topics Concern   Not on file  Social History Narrative   Exercise at the gym   Grandchild died 01-May-2022   Worked at [employer], retired 2019   Social Determinants of Health   Financial Resource Strain: High Risk   Difficulty of Paying Living Expenses: Hard  Food Insecurity: No Food Insecurity   Worried About Charity fundraiser in the Last Year: Never true   Arboriculturist in the Last  Year: Never true  Transportation Needs: No Transportation Needs   Lack of Transportation (Medical): No   Lack of Transportation (Non-Medical): No  Physical Activity: Inactive   Days of Exercise per Week: 0 days   Minutes of Exercise per Session: 0 min  Stress: Not on file  Social Connections: Moderately Isolated   Frequency of Communication with Friends and Family: More than three times a week   Frequency of Social Gatherings with Friends and Family: Twice a week   Attends Religious Services: Never   Marine scientist or Organizations: No   Attends Music therapist: Never   Marital Status: Married    Tobacco Counseling Counseling given: Not Answered   Clinical Intake:  Pre-visit preparation completed: Yes  Pain : No/denies pain     BMI - recorded: 27.74 Nutritional Status: BMI 25 -29 Overweight Nutritional Risks: None Diabetes: Yes CBG done?: No (visit completed over the phone) Did pt. bring in CBG monitor from home?: No  How often do you need to have someone help you when you read instructions, pamphlets, or other written materials from your doctor or pharmacy?: 1 - Never  Diabetes:  Is the patient diabetic?  Yes  If diabetic, was a CBG obtained today?  No , visit completed over the phone.  Did the patient bring in their glucometer from home?  No , visit completed over the phone. How often do you monitor your CBG's? Patient states not monitoring.   Financial Strains and Diabetes Management:  Are you having any financial strains with the device, your supplies or your medication? Yes ., patient states trouble covering the cost of Trulicity Does the patient want to be seen by Chronic Care Management for management of their diabetes?  No  Would the patient like to be referred to a Nutritionist or for Diabetic Management?  No   Diabetic Exams:  Diabetic Eye Exam: Completed 03/22/20.   Diabetic Foot Exam: Due, Pt has been advised about the importance  in completing this exam.  Interpreter Needed?: No  Information entered by :: Orrin Brigham LPN   Activities of Daily Living In your present state of health, do you have any difficulty performing the following activities: 01/23/2021  Hearing? N  Vision? N  Comment wears glasses  Difficulty concentrating or making decisions? N  Walking or climbing stairs? N  Dressing or bathing? N  Doing errands, shopping? N  Preparing Food and eating ? N  Using the Toilet? N  In the past six months, have you accidently leaked urine? N  Do you have problems with loss of bowel control? Y  Comment diarrhea  Managing your Medications? N  Managing your Finances? N  Housekeeping or managing your Housekeeping? N  Some recent data might be hidden    Patient Care Team: Tonia Ghent, MD  as PCP - General  Indicate any recent Medical Services you may have received from other than [facility] providers in the past year (date may be approximate).     Assessment:   This is a routine wellness examination for Deborah Alvarado.  Hearing/Vision screen Hearing Screening - Comments:: No issues Vision Screening - Comments:: Last exam 2021, upcoming appointment in Feb. 2023 , Dr. Edison Pace, wears glasses  Dietary issues and exercise activities discussed: Current Exercise Habits: The patient does not participate in regular exercise at present   Goals Addressed             This Visit's Progress    Patient Stated       Would like to maintain drinking more water       Depression Screen PHQ 2/9 Scores 01/23/2021 02/28/2020 12/29/2017 02/07/2017 02/07/2017 02/05/2016  PHQ - 2 Score 0 0 0 1 1 0    Fall Risk Fall Risk  01/23/2021 02/28/2020 12/29/2017  Falls in the past year? 0 0 No  Number falls in past yr: 0 0 -  Injury with Fall? 0 0 -  Risk for fall due to : No Fall Risks - -  Follow up Falls prevention discussed Falls evaluation completed -    FALL RISK PREVENTION PERTAINING TO THE HOME:  Any stairs in or  around the home? Yes  If so, are there any without handrails? No  Home free of loose throw rugs in walkways, pet beds, electrical cords, etc? No  Adequate lighting in your home to reduce risk of falls? Yes   ASSISTIVE DEVICES UTILIZED TO PREVENT FALLS:  Life alert? No  Use of a cane, walker or w/c? No  Grab bars in the bathroom? No Shower chair or bench in shower? Yes  Elevated toilet seat or a handicapped toilet? Yes   TIMED UP AND GO:  Was the test performed? No , visit completed over the phone.  Cognitive Function:    Normal cognitive status assessed by this Nurse Health Advisor. No abnormalities found.      Immunizations Immunization History  Administered Date(s) Administered   Hep A / Hep B 07/31/2012, 08/31/2012, 01/21/2013   Influenza Whole 12/23/2008, 12/15/2009   Influenza, High Dose Seasonal PF 01/03/2019   Influenza,inj,Quad PF,6+ Mos 11/25/2012, 01/08/2015, 12/29/2017   Influenza-Unspecified 12/06/2013, 12/25/2015, 12/23/2016, 01/10/2020   PFIZER(Purple Top)SARS-COV-2 Vaccination 04/12/2019, 05/09/2019, 01/17/2020   Pfizer Covid-19 Vaccine Bivalent Booster 62yrs & up 01/21/2021   Pneumococcal Conjugate-13 12/29/2017, 01/05/2019   Pneumococcal Polysaccharide-23 12/25/2010   Td 04/11/2005   Tdap 02/06/2017   Zoster Recombinat (Shingrix) 01/28/2019, 04/05/2019   Zoster, Live 07/31/2012    TDAP status: Up to date  Flu Vaccine status: Up to date, patient plans to bring updated vaccine information to next appointment   Pneumococcal vaccine status: Up to date  Covid-19 vaccine status: Completed vaccines   Qualifies for Shingles Vaccine? Yes   Zostavax completed Yes   Shingrix Completed?: Yes  Screening Tests Health Maintenance  Topic Date Due   FOOT EXAM  12/30/2018   Pneumonia Vaccine 45+ Years old (33 - PPSV23 if available, else PCV20) 01/05/2020   HEMOGLOBIN A1C  05/10/2020   INFLUENZA VACCINE  10/09/2020   OPHTHALMOLOGY EXAM  03/22/2021   MAMMOGRAM   03/06/2022   COLONOSCOPY (Pts 45-45yrs Insurance coverage will need to be confirmed)  11/06/2023   TETANUS/TDAP  02/07/2027   DEXA SCAN  Completed   COVID-19 Vaccine  Completed   Hepatitis C Screening  Completed   Zoster Vaccines-  Shingrix  Completed   HPV VACCINES  Aged Out    Health Maintenance  Health Maintenance Due  Topic Date Due   FOOT EXAM  12/30/2018   Pneumonia Vaccine 27+ Years old (20 - PPSV23 if available, else PCV20) 01/05/2020   HEMOGLOBIN A1C  05/10/2020   INFLUENZA VACCINE  10/09/2020    Colorectal cancer screening: Type of screening: Colonoscopy. Completed 11/06/18. Repeat every 5 years  Mammogram status: Ordered 01/23/21. Pt provided with contact info and advised to call to schedule appt.   Bone Density status: Ordered 01/23/21. Pt provided with contact info and advised to call to schedule appt.  Lung Cancer Screening: (Low Dose CT Chest recommended if Age 4-80 years, 30 pack-year currently smoking OR have quit w/in 15years.) does not qualify.     Additional Screening:  Hepatitis C Screening: does qualify; Completed 12/29/17  Vision Screening: Recommended annual ophthalmology exams for early detection of glaucoma and other disorders of the eye. Is the patient up to date with their annual eye exam?  Yes  Who is the provider or what is the name of the office in which the patient attends annual eye exams? Dr. Edison Pace   Dental Screening: Recommended annual dental exams for proper oral hygiene  Community Resource Referral / Chronic Care Management: CRR required this visit?  No   CCM required this visit?  Yes      Plan:     I have personally reviewed and noted the following in the patient's chart:   Medical and social history Use of alcohol, tobacco or illicit drugs  Current medications and supplements including opioid prescriptions. Patient is not currently taking opioid prescriptions. Functional ability and status Nutritional status Physical  activity Advanced directives List of other physicians Hospitalizations, surgeries, and ER visits in previous 12 months Vitals Screenings to include cognitive, depression, and falls Referrals and appointments  In addition, I have reviewed and discussed with patient certain preventive protocols, quality metrics, and best practice recommendations. A written personalized care plan for preventive services as well as general preventive health recommendations were provided to patient.   Due to this being a telephonic visit, the after visit summary with patients personalized plan was offered to patient via mail or my-chart. Patient preferred to pick up at office at next visit.   Loma Messing, LPN   [id number]   Nurse Health Advisor  Nurse Notes: None

## 2021-02-04 ENCOUNTER — Other Ambulatory Visit: Payer: Self-pay | Admitting: Family Medicine

## 2021-02-07 ENCOUNTER — Telehealth: Payer: Self-pay | Admitting: *Deleted

## 2021-02-07 ENCOUNTER — Telehealth: Payer: Self-pay | Admitting: Family Medicine

## 2021-02-07 NOTE — Chronic Care Management (AMB) (Signed)
  Chronic Care Management   Note  02/07/2021 Name: Deborah Alvarado MRN: [id number] DOB: 17-Mar-1950  Deborah Alvarado is a 70 y.o. year old female who is a primary care patient of Tonia Ghent, MD. I reached out to [name] by phone today in response to a referral sent by Deborah Alvarado PCP, Tonia Ghent, MD.   Deborah Alvarado was given information about Chronic Care Management services today including:  CCM service includes personalized support from designated clinical staff supervised by her physician, including individualized plan of care and coordination with other care providers 24/7 contact phone numbers for assistance for urgent and routine care needs. Service will only be billed when office clinical staff spend 20 minutes or more in a month to coordinate care. Only one practitioner may furnish and bill the service in a calendar month. The patient may stop CCM services at any time (effective at the end of the month) by phone call to the office staff.   Patient agreed to services and verbal consent obtained.   Follow up plan:   Tatjana Secretary/administrator

## 2021-02-07 NOTE — Telephone Encounter (Signed)
   Telephone encounter was:  Successful.  02/07/2021 Name: Deborah Alvarado MRN: [id number] DOB: [birth year]  Deborah Alvarado is a 70 y.o. year old female who is a primary care patient of [name], Elveria Rising, MD . The community resource team was consulted for assistance with Patient is getting trulicity and is paying a co pay of 250.00 needs pharmacy consult to see if she qualifies for any programs with that in mind sent her information SHIIP and medicare adavantage programs to check for a better drug plan  Care guide performed the following interventions: Patient provided with information about care guide support team and interviewed to confirm resource needs Follow up call placed to community resources to determine status of patients referral.  Follow Up Plan:  No further follow up planned at this time. The patient has been provided with needed resources.  [place], Care Management  [phone] [address] , [place] [address] Email : [email or web address]

## 2021-02-09 ENCOUNTER — Telehealth: Payer: Self-pay | Admitting: Pharmacist

## 2021-02-09 NOTE — Telephone Encounter (Signed)
Attempted to reach patient to discuss Trulicity patient assistance. No answer, left VM to return call.

## 2021-02-23 ENCOUNTER — Telehealth: Payer: Self-pay

## 2021-02-23 NOTE — Progress Notes (Addendum)
° ° °  Chronic Care Management Pharmacy Assistant   Name: Deborah Alvarado  MRN: [id number] DOB: December 03, 1950  Reason for Encounter: CCM (Trulicity Patient Assistance)    Patient assistance form for Trulicity [employer]) has been uploaded. Called patient to inform them. No answer; left message.   Charlene Brooke, CPP notified  Marijean Niemann, [place] Clinical Pharmacy Assistant [phone]

## 2021-03-06 NOTE — Progress Notes (Addendum)
Name: Deborah Alvarado  MRN: [id number]        DOB: 1950-04-03   Reason for Encounter: CCM (Trulicity Patient Assistance)    Called patient to inform them that the patient assistance form for Trulicity [employer]) is ready for them to fill out. Patient would like it emailed to her at [email or web address]. Patient will print it at home and return the forms to the [place] office. I instructed patient on how to fill out the forms. Patient voiced understanding. I emailed the forms and instructions to the patient.    Charlene Brooke, CPP notified   Marijean Niemann, [place] Clinical Pharmacy Assistant [phone]   Time Spent: 20 Minutes

## 2021-03-07 ENCOUNTER — Ambulatory Visit
Admission: RE | Admit: 2021-03-07 | Discharge: 2021-03-07 | Disposition: A | Discharge status: Home / Self Care | Payer: Medicare Other | Source: Ambulatory Visit | Attending: Family Medicine | Admitting: Family Medicine

## 2021-03-07 ENCOUNTER — Other Ambulatory Visit: Payer: Self-pay

## 2021-03-07 DIAGNOSIS — Z780 Asymptomatic menopausal state: Secondary | ICD-10-CM | POA: Insufficient documentation

## 2021-03-07 DIAGNOSIS — Z1231 Encounter for screening mammogram for malignant neoplasm of breast: Secondary | ICD-10-CM

## 2021-03-07 DIAGNOSIS — M85852 Other specified disorders of bone density and structure, left thigh: Secondary | ICD-10-CM | POA: Diagnosis not present

## 2021-03-08 ENCOUNTER — Other Ambulatory Visit: Payer: Self-pay | Admitting: Family Medicine

## 2021-03-08 DIAGNOSIS — R921 Mammographic calcification found on diagnostic imaging of breast: Secondary | ICD-10-CM

## 2021-03-08 DIAGNOSIS — R928 Other abnormal and inconclusive findings on diagnostic imaging of breast: Secondary | ICD-10-CM

## 2021-03-12 ENCOUNTER — Other Ambulatory Visit: Payer: Self-pay | Admitting: Family Medicine

## 2021-03-12 DIAGNOSIS — M899 Disorder of bone, unspecified: Secondary | ICD-10-CM

## 2021-03-12 DIAGNOSIS — M8580 Other specified disorders of bone density and structure, unspecified site: Secondary | ICD-10-CM

## 2021-03-12 DIAGNOSIS — E1165 Type 2 diabetes mellitus with hyperglycemia: Secondary | ICD-10-CM

## 2021-03-14 ENCOUNTER — Other Ambulatory Visit: Payer: Medicare Other

## 2021-03-16 ENCOUNTER — Ambulatory Visit
Admission: RE | Admit: 2021-03-16 | Discharge: 2021-03-16 | Disposition: A | Discharge status: Home / Self Care | Payer: Medicare Other | Source: Ambulatory Visit | Attending: Family Medicine | Admitting: Family Medicine

## 2021-03-16 ENCOUNTER — Other Ambulatory Visit: Payer: Self-pay

## 2021-03-16 DIAGNOSIS — R921 Mammographic calcification found on diagnostic imaging of breast: Secondary | ICD-10-CM | POA: Insufficient documentation

## 2021-03-16 DIAGNOSIS — R922 Inconclusive mammogram: Secondary | ICD-10-CM | POA: Diagnosis not present

## 2021-03-16 DIAGNOSIS — R928 Other abnormal and inconclusive findings on diagnostic imaging of breast: Secondary | ICD-10-CM | POA: Insufficient documentation

## 2021-03-20 ENCOUNTER — Encounter: Payer: Self-pay | Admitting: Family Medicine

## 2021-03-20 ENCOUNTER — Ambulatory Visit (INDEPENDENT_AMBULATORY_CARE_PROVIDER_SITE_OTHER): Payer: Medicare Other | Admitting: Family Medicine

## 2021-03-20 ENCOUNTER — Other Ambulatory Visit: Payer: Self-pay

## 2021-03-20 VITALS — BP 124/82 | HR 88 | Temp 97.9°F | Ht 64.0 in | Wt 161.0 lb

## 2021-03-20 DIAGNOSIS — E119 Type 2 diabetes mellitus without complications: Secondary | ICD-10-CM | POA: Diagnosis not present

## 2021-03-20 DIAGNOSIS — R251 Tremor, unspecified: Secondary | ICD-10-CM

## 2021-03-20 DIAGNOSIS — R300 Dysuria: Secondary | ICD-10-CM | POA: Diagnosis not present

## 2021-03-20 DIAGNOSIS — M899 Disorder of bone, unspecified: Secondary | ICD-10-CM

## 2021-03-20 DIAGNOSIS — Z7189 Other specified counseling: Secondary | ICD-10-CM

## 2021-03-20 DIAGNOSIS — E1165 Type 2 diabetes mellitus with hyperglycemia: Secondary | ICD-10-CM

## 2021-03-20 DIAGNOSIS — M25569 Pain in unspecified knee: Secondary | ICD-10-CM | POA: Diagnosis not present

## 2021-03-20 DIAGNOSIS — M8580 Other specified disorders of bone density and structure, unspecified site: Secondary | ICD-10-CM | POA: Diagnosis not present

## 2021-03-20 DIAGNOSIS — I10 Essential (primary) hypertension: Secondary | ICD-10-CM | POA: Diagnosis not present

## 2021-03-20 DIAGNOSIS — E785 Hyperlipidemia, unspecified: Secondary | ICD-10-CM

## 2021-03-20 DIAGNOSIS — Z0000 Encounter for general adult medical examination without abnormal findings: Secondary | ICD-10-CM

## 2021-03-20 LAB — COMPREHENSIVE METABOLIC PANEL
ALT: 32 U/L (ref 0–35)
AST: 28 U/L (ref 0–37)
Albumin: 4.5 g/dL (ref 3.5–5.2)
Alkaline Phosphatase: 78 U/L (ref 39–117)
BUN: 15 mg/dL (ref 6–23)
CO2: 26 mEq/L (ref 19–32)
Calcium: 10 mg/dL (ref 8.4–10.5)
Chloride: 101 mEq/L (ref 96–112)
Creatinine, Ser: 0.81 mg/dL (ref 0.40–1.20)
GFR: 74.47 mL/min (ref >60.00)
Glucose, Bld: 188 mg/dL — ABNORMAL HIGH (ref 70–99)
Potassium: 4.7 mEq/L (ref 3.5–5.1)
Sodium: 136 mEq/L (ref 135–145)
Total Bilirubin: 0.5 mg/dL (ref 0.2–1.2)
Total Protein: 7 g/dL (ref 6.0–8.3)

## 2021-03-20 LAB — POC URINALSYSI DIPSTICK (AUTOMATED)
Bilirubin, UA: NEGATIVE
Blood, UA: NEGATIVE
Glucose, UA: POSITIVE — AB
Ketones, UA: NEGATIVE
Nitrite, UA: NEGATIVE
Protein, UA: POSITIVE — AB
Spec Grav, UA: >=1.03 — AB (ref 1.010–1.025)
Urobilinogen, UA: 0.2 E.U./dL
pH, UA: 5 (ref 5.0–8.0)

## 2021-03-20 LAB — LIPID PANEL
Cholesterol: 150 mg/dL (ref 0–200)
HDL: 39 mg/dL — ABNORMAL LOW (ref >39.00)
NonHDL: 110.83
Total CHOL/HDL Ratio: 4
Triglycerides: 277 mg/dL — ABNORMAL HIGH (ref 0.0–149.0)
VLDL: 55.4 mg/dL — ABNORMAL HIGH (ref 0.0–40.0)

## 2021-03-20 LAB — LDL CHOLESTEROL, DIRECT: Direct LDL: 84 mg/dL

## 2021-03-20 LAB — HEMOGLOBIN A1C: Hgb A1c MFr Bld: 8.8 % — ABNORMAL HIGH (ref 4.6–6.5)

## 2021-03-20 LAB — VITAMIN D 25 HYDROXY (VIT D DEFICIENCY, FRACTURES): VITD: 45.56 ng/mL (ref 30.00–100.00)

## 2021-03-20 MED ORDER — METFORMIN HCL 500 MG PO TABS: 500.0000 mg | ORAL_TABLET | Freq: Two times a day (BID) | ORAL | Status: DC

## 2021-03-20 MED ORDER — SULFAMETHOXAZOLE-TRIMETHOPRIM 800-160 MG PO TABS: 1.0000 | ORAL_TABLET | Freq: Two times a day (BID) | ORAL | 0 refills | Status: DC

## 2021-03-20 MED ORDER — SIMVASTATIN 40 MG PO TABS: 40.0000 mg | ORAL_TABLET | Freq: Every day | ORAL | 3 refills | Status: DC

## 2021-03-20 NOTE — Patient Instructions (Addendum)
Try cutting back on metformin.   If GI sx continue, then let me know.   Go to the lab on the way out.   If you have mychart we'll likely use that to update you.    Drink plenty of water and start the antibiotics today.   Take care.  Glad to see you. Update me as needed about your knee.

## 2021-03-20 NOTE — Progress Notes (Signed)
This visit occurred during the SARS-CoV-2 public health emergency.  Safety protocols were in place, including screening questions prior to the visit, additional usage of staff PPE, and extensive cleaning of exam room while observing appropriate contact time as indicated for disinfecting solutions.  covid booster 2021 Flu 2022 shingrix up to date PNA vaccine prev done.  Tetanus 2018.   Mammogram 2023.   DXA 2022 Living will d/w pt.  Husband designated if patient were incapacitated Colonoscopy 2020  Urinary frequency and burning.  Going on "for a while."  No fevers.  Discussed options.  See plan.  She noted L hand tremor with activity but not at rest.  She is R handed.  No blood relatives with similar. No bothersome. D/w pt. No dysphagia.  No falls.  No leg tremor.    Diabetes:  Using medications without difficulties: she is taking metformin 2 tabs BID and having diarrhea.  D/w pt about cutting back to 1 pill BID.   Hypoglycemic episodes: only if prolonged fasting and cautions d/w pt.   Hyperglycemic episodes: no sx Feet problems: some burning at night.   Blood Sugars averaging: not checked.   eye exam within last year: f/u pending for February.    She has some occ indigestion, I asked her to see if that is more likely to happen after taking trulicity injection.   Hypertension:    Using medication without problems or lightheadedness: yes Chest pain with exertion:no Edema:no Short of breath:no Labs pending.    Elevated Cholesterol: Using medications without problems: yes Muscle aches: not diffuse aches.  She likely has L knee pain from OA.   Diet compliance: d/w pt.  Exercise: d/w pt    D/w pt about parking form re: knee pain, done at [facility].    PMH and SH reviewed.   Vital signs, Meds and allergies reviewed.  ROS: Per HPI unless specifically indicated in ROS section   GEN: nad, alert and oriented HEENT: ncat NECK: supple w/o LA CV: rrr. PULM: ctab, no inc wob ABD: soft,  +bs EXT: no edema SKIN: no acute rash L hand tremor, not R, with extension but not at rest.   L medial knee ttp and crepitus on ROM.    Diabetic foot exam: Normal inspection No skin breakdown Distal L 2nd MT callus  Normal DP pulses Normal sensation to light tough and monofilament Nails normal

## 2021-03-21 ENCOUNTER — Telehealth: Payer: Self-pay

## 2021-03-21 DIAGNOSIS — M25569 Pain in unspecified knee: Secondary | ICD-10-CM | POA: Insufficient documentation

## 2021-03-21 DIAGNOSIS — R251 Tremor, unspecified: Secondary | ICD-10-CM | POA: Insufficient documentation

## 2021-03-21 DIAGNOSIS — R300 Dysuria: Secondary | ICD-10-CM | POA: Insufficient documentation

## 2021-03-21 LAB — URINE CULTURE
MICRO NUMBER:: 12850992
SPECIMEN QUALITY:: ADEQUATE

## 2021-03-21 MED ORDER — TRULICITY 3 MG/0.5ML ~~LOC~~ SOAJ: 3.0000 mg | SUBCUTANEOUS | Status: DC

## 2021-03-21 NOTE — Assessment & Plan Note (Signed)
Continue losartan.  See notes on labs.

## 2021-03-21 NOTE — Assessment & Plan Note (Signed)
Living will d/w pt.  Husband designated if patient were incapacitated.  

## 2021-03-21 NOTE — Assessment & Plan Note (Addendum)
She is taking metformin 2 tabs BID and having diarrhea.  D/w pt about cutting back to 1 pill BID.  See notes on labs.  We will forward her labs to endocrinology.  Continue Trulicity and glimepiride for now.  She has some occ indigestion, I asked her to see if that is more likely to happen after taking trulicity injection.  This may be related to metformin instead.

## 2021-03-21 NOTE — Assessment & Plan Note (Signed)
Check urinalysis and urine culture.  Start Septra for presumed UTI.  Routine cautions given to patient.

## 2021-03-21 NOTE — Telephone Encounter (Signed)
Please send me the form and I'll work on it.  Thanks.

## 2021-03-21 NOTE — Telephone Encounter (Signed)
Received Trulicity assistance application from patient. The prescriber section is blank. Called patient to confirm dose and prescriber:  She is taking Trulicity 3 mg once weekly. Prescriber was Malissa Hippo, NP but she is no longer at [facility]. Patient has not been assigned a new endocrinologist yet. Is Dr. [name] willing to prescribe the Trulicity since patient doesn't have a current prescriber? If so, Ill fill out the form with his information and give to [facility] to sign.  Above is confirmed by refill history in chart: TRULICITY  3 WV/3.7TG SOPN 02/19/2021 28 2 mL Warnell Forester, NP [id number]...   Debbora Dus, PharmD Clinical Pharmacist Practitioner [facility] at [facility] [phone]

## 2021-03-21 NOTE — Assessment & Plan Note (Signed)
Continue simvastatin.  See notes on labs. 

## 2021-03-21 NOTE — Assessment & Plan Note (Signed)
She does not have parkinsonian features.  Would observe for now.  If progressive we can address.  She agrees.

## 2021-03-21 NOTE — Assessment & Plan Note (Signed)
With pain walking, likely from osteoarthritis.  Parking form done.  She can update me as needed.

## 2021-03-21 NOTE — Assessment & Plan Note (Signed)
covid booster 2021 Flu 2022 shingrix up to date PNA vaccine prev done.  Tetanus 2018.   Mammogram 2023.   DXA 2022 Living will d/w pt.  Husband designated if patient were incapacitated Colonoscopy 2020

## 2021-03-22 NOTE — Telephone Encounter (Signed)
Sent to [employer].

## 2021-03-22 NOTE — Telephone Encounter (Signed)
Placed form in Dr Josefine Class inbox to work on

## 2021-03-23 NOTE — Telephone Encounter (Signed)
I'll work on the hard copy.  Thanks.  

## 2021-03-26 ENCOUNTER — Telehealth: Payer: Self-pay | Admitting: Family Medicine

## 2021-03-26 ENCOUNTER — Other Ambulatory Visit: Payer: Self-pay | Admitting: Family Medicine

## 2021-03-26 ENCOUNTER — Telehealth: Payer: Self-pay

## 2021-03-26 ENCOUNTER — Encounter: Payer: Self-pay | Admitting: Family Medicine

## 2021-03-26 ENCOUNTER — Telehealth (INDEPENDENT_AMBULATORY_CARE_PROVIDER_SITE_OTHER): Payer: Medicare Other | Admitting: Family Medicine

## 2021-03-26 ENCOUNTER — Other Ambulatory Visit: Payer: Self-pay

## 2021-03-26 VITALS — Ht 64.0 in

## 2021-03-26 DIAGNOSIS — U071 COVID-19: Secondary | ICD-10-CM | POA: Diagnosis not present

## 2021-03-26 MED ORDER — SULFAMETHOXAZOLE-TRIMETHOPRIM 800-160 MG PO TABS: 1.0000 | ORAL_TABLET | Freq: Two times a day (BID) | ORAL | 0 refills | Status: DC

## 2021-03-26 NOTE — Progress Notes (Addendum)
Chronic Care Management Pharmacy Assistant   Name: Deborah Alvarado  MRN: [id number] DOB: 08/10/1950  Reason for Encounter: CCM (Initial Questions)   Recent office visits:  03/20/2021 - Elsie Stain, MD - Patient presented for Dysuria. Labs: Urinalysis, LDL, Urine Culture, CMP, A1c, Lipid and Vit D. Start: sulfamethoxazole-trimethoprim (BACTRIM DS) 800-160 MG tablet. Change: metFORMIN (GLUCOPHAGE) 500 MG tablet vs. 1000 mg.  01/23/2021 - Orrin Brigham, LPN - Patient presented for Medicare Wellness Visit. Orders: Mammogram and Dexascan. Referral to [facility] Coordination.   Recent consult visits:  03/16/2021 [facility] - Patient presented for follow up mammogram imaging for abnormal findings. Normal f/u imaging.  03/07/2021 - [facility] - Patient presented for Mammogram and [place].  Hospital visits:  None in previous 6 months  Medications: Outpatient Encounter Medications as of 03/26/2021  Medication Sig   B-D UF III MINI PEN NEEDLES 31G X 5 MM MISC INJECT 1 DEVICE AS DIRECTED DAILY. USE WITH INSULIN PEN   Cholecalciferol (VITAMIN D) 50 MCG (2000 UT) tablet Take 1 tablet (2,000 Units total) by mouth daily.   Dulaglutide (TRULICITY) 3 LZ/7.6BH SOPN Inject 3 mg as directed once a week.   glimepiride (AMARYL) 2 MG tablet TAKE 1 TABLET (2 MG TOTAL) BY MOUTH 2 (TWO) TIMES DAILY.   losartan (COZAAR) 50 MG tablet TAKE 1 TABLET BY MOUTH  DAILY   metFORMIN (GLUCOPHAGE) 500 MG tablet Take 1 tablet (500 mg total) by mouth 2 (two) times daily with a meal.   simvastatin (ZOCOR) 40 MG tablet Take 1 tablet (40 mg total) by mouth at bedtime.   sulfamethoxazole-trimethoprim (BACTRIM DS) 800-160 MG tablet Take 1 tablet by mouth 2 (two) times daily.   No facility-administered encounter medications on file as of 03/26/2021.   Lab Results  Component Value Date/Time   HGBA1C 8.8 (H) 03/20/2021 11:44 AM   HGBA1C 9.8 11/11/2019 12:00 AM   HGBA1C 10.6 (H) 12/29/2017 11:52 AM    MICROALBUR 1.0 10/05/2008 09:54 AM   MICROALBUR 1.7 07/16/2007 09:40 AM    BP Readings from Last 3 Encounters:  03/20/21 124/82  02/28/20 127/82  12/29/17 122/70   Patient contacted to review initial questions prior to visit with Debbora Dus.  Have you seen any other providers since your last visit with PCP? No  Any changes in your medications or health? Yes 03/20/2021 - Start: sulfamethoxazole-trimethoprim (BACTRIM DS) 800-160 MG tablet. Change: metFORMIN (GLUCOPHAGE) 500 MG tablet vs. 1000 mg.   Any side effects from any medications? No  Do you have an symptoms or problems not managed by your medications? No  Any concerns about your health right now? No  Has your provider asked that you check blood pressure, blood sugar, or follow special diet at home? Yes - She has been asked to take blood pressure and blood sugar but she does not. Patient stated she doesn't like the way the numbers go up and down so she doesn't take it at all.   Do you get any type of exercise on a regular basis? No - not right now.   Can you think of a goal you would like to reach for your health? Yes - Patient would like to stay well.   Do you have any problems getting your medications? No  Is there anything that you would like to discuss during the appointment? No  Spoke with patient and reminded them to have all medications, supplements and any blood glucose and blood pressure readings available for review with  pharmacist, at their telephone visit on 04/02/2021 at 11:00 am.    Star Rating Drugs:  Medication:  Last Fill: Day Supply Trulicity 3mg /0.59ml 02/19/2021 28 Simvastatin 40 mg 02/25/2021 90 Metformin 500 mg 02/15/2021 90  Losartan 50  02/25/2021 90   Glimepiride 2 mg 03/05/2021 90   Care Gaps: Annual wellness visit in last year? Yes 01/23/2021 Most Recent BP reading: 124/82 on 03/20/2021  If Diabetic: Most recent A1C reading:  8.8 on 03/20/2021 Last eye exam / retinopathy screening: Due  since 03/22/2021 Last diabetic foot exam: Up to date  Debbora Dus, CPP notified  Marijean Niemann, [place] Assistant [phone]  I have reviewed the care management and care coordination activities outlined in this encounter and I am certifying that I agree with the content of this note. No further action required.  Debbora Dus, PharmD Clinical Pharmacist [facility] at [facility] [phone]

## 2021-03-26 NOTE — Progress Notes (Signed)
Deborah Alvarado T. Deborah Dacruz, MD Primary Care and Sports Medicine [facility] at [facility] [place], [address] Phone: [phone]   FAX: [phone]  Deborah Alvarado - 71 y.o. female   MRN [id number]   Date of Birth: [birth year]  Visit Date: 03/26/2021   PCP: Tonia Ghent, MD   Referred by: Tonia Ghent, MD  Virtual Visit via Video Note:  I connected with  Deborah Alvarado on 03/26/2021  3:40 PM EST by a video enabled telemedicine application and verified that I am speaking with the correct person using two identifiers.   Location patient: home computer, tablet, or smartphone Location provider: work or home office Consent: Verbal consent directly obtained from Deborah Alvarado. Persons participating in the virtual visit: patient, provider  I discussed the limitations of evaluation and management by telemedicine and the availability of in person appointments. The patient expressed understanding and agreed to proceed.  Chief Complaint  Patient presents with   Headache    No Coivd Test done   Vomiting    Started Thursday   Ear Pain   Fatigue   Cough    History of Present Illness:  She is a very pleasant lady who presents virtually regarding acute illness with some headache, nauseousness, throwing up, sensation of ear pain, sore throat, as well as more rhinosinusitis symptoms.  She globally feels fatigued and does not feel all that great.  She still is able to eat okay.  She is not having any additional diarrhea and no neurological changes.  She wonders whether she could have a bout of sinusitis.  Review of Systems as above: See pertinent positives and pertinent negatives per HPI No acute distress verbally   Observations/Objective/Exam:  An attempt was made to discern vital signs over the phone and per patient if applicable and possible.   General:    Alert, Oriented, appears well and in no acute distress  Pulmonary:     On  inspection no signs of respiratory distress.  Psych / Neurological:     Pleasant and cooperative.  Assessment and Plan:    ICD-10-CM   1. COVID-19  U07.1      I had the patient go home and check a COVID-19 test.  Her COVID-19 test did come back positive, and therefore we will treat her with some antivirals given her age and risk.  Otherwise she should continue with some supportive care and I reviewed isolation.   Deborah Alvarado is going to call her and confirm that she needs to hold her statin.  I discussed the assessment and treatment plan with the patient. The patient was provided an opportunity to ask questions and all were answered. The patient agreed with the plan and demonstrated an understanding of the instructions.   The patient was advised to call back or seek an in-person evaluation if the symptoms worsen or if the condition fails to improve as anticipated.  Follow-up: prn unless noted otherwise below No follow-ups on file.  Meds ordered this encounter  Medications   nirmatrelvir/ritonavir EUA (PAXLOVID) 20 x 150 MG & 10 x 100MG  TABS    Sig: Take 3 tablets by mouth 2 (two) times daily for 5 days. (Take nirmatrelvir 150 mg two tablets twice daily for 5 days and ritonavir 100 mg one tablet twice daily for 5 days) Patient GFR is 74    Dispense:  30 tablet    Refill:  0   No orders  of the defined types were placed in this encounter.   Signed,  Maud Deed. Brynley Cuddeback, MD

## 2021-03-26 NOTE — Telephone Encounter (Signed)
Received signed forms. Sharyn Lull. Do you I need to fax anything else over with pages 5 to 6?

## 2021-03-26 NOTE — Telephone Encounter (Signed)
Deborah Alvarado called in and wanted to let Dr. Lorelei Pont know that she took a covid test and it came back positive

## 2021-03-26 NOTE — Telephone Encounter (Signed)
Application faxed as requested

## 2021-03-26 NOTE — Progress Notes (Signed)
I will call on check on Trulicity patient application on 78/41/2820.

## 2021-03-27 MED ORDER — NIRMATRELVIR/RITONAVIR (PAXLOVID)TABLET: 3.0000 | ORAL_TABLET | Freq: Two times a day (BID) | ORAL | 0 refills | Status: AC

## 2021-03-27 NOTE — Telephone Encounter (Signed)
I sent her in some Paxlovid.  Can you tell her and verbally confirm that she needs to stop her statin today and hold it for 10 days.

## 2021-03-27 NOTE — Telephone Encounter (Signed)
Mrs. Omura notified as instructed by telephone.  Patient states understanding.

## 2021-04-02 ENCOUNTER — Ambulatory Visit (INDEPENDENT_AMBULATORY_CARE_PROVIDER_SITE_OTHER): Payer: Medicare Other

## 2021-04-02 ENCOUNTER — Other Ambulatory Visit: Payer: Self-pay

## 2021-04-02 DIAGNOSIS — E785 Hyperlipidemia, unspecified: Secondary | ICD-10-CM

## 2021-04-02 DIAGNOSIS — I10 Essential (primary) hypertension: Secondary | ICD-10-CM

## 2021-04-02 DIAGNOSIS — E1165 Type 2 diabetes mellitus with hyperglycemia: Secondary | ICD-10-CM

## 2021-04-02 DIAGNOSIS — E119 Type 2 diabetes mellitus without complications: Secondary | ICD-10-CM

## 2021-04-02 NOTE — Patient Instructions (Signed)
April 02, 2021  Dear Deborah Alvarado,  It was a pleasure meeting you during our initial appointment on April 02, 2021. Below is a summary of the goals we discussed and components of chronic care management. Please contact me anytime with questions or concerns.   Visit Information  Patient Care Plan: General Pharmacy (Adult)     Problem Identified: CHL AMB "PATIENT-SPECIFIC PROBLEM"      Long-Range Goal: Patient Stated   Start Date: 04/02/2021  Priority: High  Note:   Current Barriers:  Unable to achieve control of diabetes   Pharmacist Clinical Goal(s):  Patient will contact provider office for questions/concerns as evidenced notation of same in electronic health record through collaboration with PharmD and provider.   Interventions: 1:1 collaboration with Tonia Ghent, MD regarding development and update of comprehensive plan of care as evidenced by provider attestation and co-signature Inter-disciplinary care team collaboration (see longitudinal plan of care) Comprehensive medication review performed; medication list updated in electronic medical record  Hypertension (BP goal <130/80) -Controlled, per clinic BP readings within goal. She has taken losartan since 2012, tolerates well. She does not monitor home BP. -Current treatment: Losartan 50 mg - 1 tablet daily -Medications previously tried: lisinopril (cough) -Current home readings: none  -Current exercise habits: Stopped going to gym in 2020 after COVID restrictions. She gets a free membership with insurance and she would like to get back into working out. She has 3 acres which keeps her active in the Spring/Summer. We discussed importance of daily exercise, starting with 10-15 minute increments for heart health. -Denies hypotensive/hypertensive symptoms -Recommended to continue current medication; Increase activity with goal of 10 minutes each day (brisk walk).   Hyperlipidemia: (LDL goal < 70) -Not ideally  controlled, LDL 84. LDL goal < 70 per ADA 2023 recommendations for patients with diabetes, age 22-75 at increased cardiovascular risk. Recommend trial of high intensity statin. However, pt is currently off medication for 10 days due to covid treatment. Will revisit at follow up. The 10-year ASCVD risk score (Arnett DK, et al., 2019) is: 17.5%   Values used to calculate the score:     Age: 71 years     Sex: Female     Is Non-Hispanic African American: No     Diabetic: Yes     Tobacco smoker: No     Systolic Blood Pressure: 053 mmHg     Is BP treated: Yes     HDL Cholesterol: 39 mg/dL     Total Cholesterol: 150 mg/dL -Current treatment: Simvastatin 40 mg - 1 tablet daily -Medications previously tried: none - pt denies previous trials of statins or intolerances -Current dietary patterns: tries to follow low carb diet  -Current exercise habits: none formal -Educated on Cholesterol goals;  -Recommended to continue current medication  Diabetes (A1c goal <7%) -Not ideally controlled, she was seeing Roe Coombs, NP but will transition to Dr. Honor Junes at [facility] endocrinology (appt March 2023). She recently saw PCP and metformin was reduced from 2000 mg/day to 1000 mg/day on 03/20/21. She denies any noticeable improvement with dose reduction. She also had COVID last week and thinks this may be the cause of her stomach issues. She will resume 2000 mg/day if stomach issues resolve post-covid. She reports she has never been to diabetes education classes and started going to [facility] about 1 year ago. Denies interest in education classes today. She takes on Trulicity on Mondays. She had 1 box on hand, 1 month supply. She tolerates well.  She takes metformin and glimepiride with meals twice daily. She has supplies to check BG but does not monitor routinely. Monitoring causes her to worry about the numbers too much. We discussed benefits of routine monitoring, especially prior to appointments.   -Current medications: Metformin 500 mg - 1 tablet twice daily with meals  Trulicity 3 mg - Inject once weekly as directed  Glimepiride 2 mg - 1 tablet twice daily with meals  -Medications previously tried: metformin 2000 mg/day (upset stomach), Jardiance (yeast infections) -Current home glucose readings:  fasting glucose: none post prandial glucose: none -Denies hypoglycemic/hyperglycemic symptoms -Current meal patterns: She limits potatoes and rice. Dinner: mostly chicken, vegetables  Drinks: soda < 1/day, tries to avoid -Current exercise: none formal -Diabetic foot exam completed 03/20/21 per PCP -Eye exam is DUE. -Discussed importance of diabetes education/support with nutrition.  -Recommended to continue current medication; Let us know if interested in diabetes classes. Check blood glucose for 1 week prior to endocrinology visit in March.   Osteoporosis / Osteopenia (Goal ) -Controlled -Last DEXA Scan: 03/07/21  T-Score femoral neck: -1.5   T-Score total hip: n/a  T-Score lumbar spine: n/a  T-Score forearm radius: Normal 0.3  10-year probability of major osteoporotic fracture: 15.1%  10-year probability of hip fracture: 1.8% -Patient is not a candidate for pharmacologic treatment -She fell a couple years ago on ice. No falls since then. She tries to stay very cautious. Denies any history of hip fractures. -Current treatment  Vitamin D 2000 IU - 1 tablet daily  -Medications previously tried: none   -Recommend [phone] units of vitamin D daily. Recommend 1200 mg of calcium daily from dietary and supplemental sources. Discussed calcium in diet.  -Recommended to continue current medication   Patient Goals/Self-Care Activities Patient will:  - take medications as prescribed as evidenced by patient report and record review - check blood glucose for at least 1 week (twice daily) prior to endocrinology visit in March  Follow Up Plan: Telephone follow up appointment with care  management team member scheduled for: -CCM PharmD follow up in 3 months -[place] BG log review in 1 month     Deborah Alvarado was given information about Chronic Care Management services today including:  CCM service includes personalized support from designated clinical staff supervised by her physician, including individualized plan of care and coordination with other care providers 24/7 contact phone numbers for assistance for urgent and routine care needs. Standard insurance, coinsurance, copays and deductibles apply for chronic care management only during months in which we provide at least 20 minutes of these services. Most insurances cover these services at 100%, however patients may be responsible for any copay, coinsurance and/or deductible if applicable. This service may help you avoid the need for more expensive face-to-face services. Only one practitioner may furnish and bill the service in a calendar month. The patient may stop CCM services at any time (effective at the end of the month) by phone call to the office staff.  Patient agreed to services and verbal consent obtained.   Patient verbalizes understanding of instructions and care plan provided today and agrees to view in [place]. Active MyChart status confirmed with patient.    Debbora Dus, PharmD Clinical Pharmacist Practitioner [facility] at [facility] [phone]

## 2021-04-02 NOTE — Progress Notes (Signed)
Chronic Care Management Pharmacy Note  04/02/2021 Name:  Deborah Alvarado MRN:  [id number] DOB:  April 18, 1950  Summary: Initial CCM visit completed. Reviewed all medications and the following conditions: HTN, DM, HLD, and osteopenia. HTN, controlled < 130/80 on losartan. HLD, not ideally controlled on simvastatin with LDL 84. Recommend high intensity statin due to her ASCVD risk of 17% (borderline high) and diabetes. She is currently holding simvastatin due to [place] treatment so we tabled this discussion for follow up. DM, uncontrolled A1c 8.8%. Encouraged home monitoring of BG and diabetes education. She will think about this. Pt has follow up with endocrinology in March 2023. Osteopenia, on vitamin D. Discussed calcium intake. She is not a candidate for treatment at this time. No barriers to adherence identified.  Recommendations/Changes made from today's visit: Consider diabetes education classes Recommend checking home BG twice daily for 1 week  Plan: CCM follow up in 3 months [name] BG log review in 1 month   Subjective: Deborah Alvarado is an 71 y.o. year old female who is a primary patient of Deborah Alvarado, Deborah Rising, MD.  The CCM team was consulted for assistance with disease management and care coordination needs.    Engaged with patient by telephone for initial visit in response to provider referral for pharmacy case management and/or care coordination services.   Consent to Services:  The patient was given the following information about Chronic Care Management services today, agreed to services, and gave verbal consent: 1. CCM service includes personalized support from designated clinical staff supervised by the primary care provider, including individualized plan of care and coordination with other care providers 2. 24/7 contact phone numbers for assistance for urgent and routine care needs. 3. Service will only be billed when office clinical staff spend 20 minutes or more in a  month to coordinate care. 4. Only one practitioner may furnish and bill the service in a calendar month. 5.The patient may stop CCM services at any time (effective at the end of the month) by phone call to the office staff. 6. The patient will be responsible for cost sharing (co-pay) of up to 20% of the service fee (after annual deductible is met). Patient agreed to services and consent obtained.  Patient Care Team: Tonia Ghent, MD as PCP - Claris Gower, [facility] as Pharmacist (Pharmacist) Lonia Farber, MD as Consulting Physician (Internal Medicine)  Recent office visits: 03/20/2021 - Deborah Stain, MD - Patient presented for Dysuria. Labs: Urinalysis, LDL, Urine Culture, CMP, A1c, Lipid and Vit D. Start: sulfamethoxazole-trimethoprim (BACTRIM DS) 800-160 MG tablet. Reduce metFORMIN (GLUCOPHAGE) 500 MG tablet vs. 1000 mg.  01/23/2021 - Deborah Brigham, LPN - Patient presented for Medicare Wellness Visit. Orders: Mammogram and Dexascan. Referral to [facility] Coordination.    Recent consult visits:  03/16/2021 Deborah Alvarado - Patient presented for follow up mammogram imaging for abnormal findings. Normal f/u imaging.  03/07/2021 - Deborah Alvarado - Patient presented for Mammogram and [place].   Hospital visits:  None in previous 6 months  Objective:  Lab Results  Component Value Date   CREATININE 0.81 03/20/2021   BUN 15 03/20/2021   GFR 74.47 03/20/2021   GFRNONAA 68.49 01/02/2010   GFRAA 112 07/16/2007   NA 136 03/20/2021   K 4.7 03/20/2021   CALCIUM 10.0 03/20/2021   CO2 26 03/20/2021   GLUCOSE 188 (H) 03/20/2021    Lab Results  Component Value Date/Time   HGBA1C 8.8 (H) 03/20/2021 11:44 AM  HGBA1C 9.8 11/11/2019 12:00 AM   HGBA1C 10.6 (H) 12/29/2017 11:52 AM   GFR 74.47 03/20/2021 11:44 AM   GFR 72.23 12/29/2017 11:52 AM   MICROALBUR 1.0 10/05/2008 09:54 AM   MICROALBUR 1.7 07/16/2007 09:40 AM    Last diabetic Eye exam:  Lab Results   Component Value Date/Time   HMDIABEYEEXA No Retinopathy 03/22/2020 12:00 AM    Last diabetic Foot exam:   03/20/21 completed by Dr. Damita Alvarado   Lab Results  Component Value Date   CHOL 150 03/20/2021   HDL 39.00 (L) 03/20/2021   LDLCALC 97 02/06/2017   LDLDIRECT 84.0 03/20/2021   TRIG 277.0 (H) 03/20/2021   CHOLHDL 4 03/20/2021    Hepatic Function Latest Ref Rng & Units 03/20/2021 12/29/2017 02/06/2017  Total Protein 6.0 - 8.3 g/dL 7.0 7.2 6.9  Albumin 3.5 - 5.2 g/dL 4.5 4.5 4.3  AST 0 - 37 U/L 28 21 35  ALT 0 - 35 U/L 32 30 43(H)  Alk Phosphatase 39 - 117 U/L 78 96 86  Total Bilirubin 0.2 - 1.2 mg/dL 0.5 0.6 0.6  Bilirubin, Direct 0.0 - 0.3 mg/dL - - -    Lab Results  Component Value Date/Time   TSH 2.91 10/05/2008 09:54 AM   TSH 2.10 07/16/2007 09:40 AM    CBC Latest Ref Rng & Units 10/05/2008  WBC 4.5 - 10.5 10*3/microliter 5.2  Hemoglobin 12.0 - 15.0 g/dL 13.8  Hematocrit 36.0 - 46.0 % 40.6  Platelets 150.0 - 400.0 K/uL 220.0    Lab Results  Component Value Date/Time   VD25OH 45.56 03/20/2021 11:44 AM   VD25OH 39.94 04/17/2018 11:12 AM    Clinical ASCVD: No  The 10-year ASCVD risk score (Arnett DK, et al., 2019) is: 17.5%   Values used to calculate the score:     Age: 71 years     Sex: Female     Is Non-Hispanic African American: No     Diabetic: Yes     Tobacco smoker: No     Systolic Blood Pressure: 664 mmHg     Is BP treated: Yes     HDL Cholesterol: 39 mg/dL     Total Cholesterol: 150 mg/dL    Depression screen [facility] 2/9 01/23/2021 02/28/2020 12/29/2017  Decreased Interest 0 0 0  Down, Depressed, Hopeless 0 0 0  PHQ - 2 Score 0 0 0    Social History   Tobacco Use  Smoking Status Never  Smokeless Tobacco Never   BP Readings from Last 3 Encounters:  03/20/21 124/82  02/28/20 127/82  12/29/17 122/70   Pulse Readings from Last 3 Encounters:  03/20/21 88  02/28/20 96  12/29/17 (!) 108   Wt Readings from Last 3 Encounters:  03/20/21 161 lb  (73 kg)  01/23/21 161 lb (73 kg)  02/28/20 161 lb 11.2 oz (73.3 kg)   BMI Readings from Last 3 Encounters:  03/26/21 27.64 kg/m  03/20/21 27.64 kg/m  01/23/21 27.64 kg/m    Assessment/Interventions: Review of patient past medical history, allergies, medications, health status, including review of consultants reports, laboratory and other test data, was performed as part of comprehensive evaluation and provision of chronic care management services.   SDOH:  (Social Determinants of Health) assessments and interventions performed: Yes SDOH Interventions    Flowsheet Row Most Recent Value  SDOH Interventions   Financial Strain Interventions Other (Comment)  [Patient assistance]  Housing Interventions Intervention Not Indicated      SDOH Screenings   Alcohol Screen:  Low Risk    Last Alcohol Screening Score (AUDIT): 0  Depression (PHQ2-9): Low Risk    PHQ-2 Score: 0  Financial Resource Strain: Medium Risk   Difficulty of Paying Living Expenses: Somewhat hard  Food Insecurity: No Food Insecurity   Worried About Charity fundraiser in the Last Year: Never true   Ran Out of Food in the Last Year: Never true  Housing: Low Risk    Last Housing Risk Score: 0  Physical Activity: Inactive   Days of Exercise per Week: 0 days   Minutes of Exercise per Session: 0 min  Social Connections: Moderately Isolated   Frequency of Communication with Friends and Family: More than three times a week   Frequency of Social Gatherings with Friends and Family: Twice a week   Attends Religious Services: Never   Marine scientist or Organizations: No   Attends Music therapist: Never   Marital Status: Married  Stress: Not on file  Tobacco Use: Low Risk    Smoking Tobacco Use: Never   Smokeless Tobacco Use: Never   Passive Exposure: Not on file  Transportation Needs: No Transportation Needs   Lack of Transportation (Medical): No   Lack of Transportation (Non-Medical): No     CCM Care Plan  Allergies  Allergen Reactions   Lisinopril     Cough, presumed   Metformin And Related Other (See Comments)    Diarrhea at >1072m a day    Medications Reviewed Today     Reviewed by [facility]a [facility]us [facility](Pharmacist) on 04/02/21 at 1212  Med List Status: <None>   Medication Order Taking? Sig Documenting Provider Last Dose Status Informant  B-D UF III MINI PEN NEEDLES 31G X 5 MM MISC [id number] INJECT 1 DEVICE AS DIRECTED DAILY. USE WITH INSULIN PEN DTonia Ghent MD Taking Active   Cholecalciferol (VITAMIN D) 50 MCG (2000 UT) tablet [id number] Take 1 tablet (2,000 Units total) by mouth daily. DTonia Ghent MD Taking Active   Dulaglutide (TRULICITY) 3 MYQ/6.5HQSBonney [id number] Inject 3 mg as directed once a week. DTonia Ghent MD Taking Active   glimepiride (AMARYL) 2 MG tablet [id number] TAKE 1 TABLET (2 MG TOTAL) BY MOUTH 2 (TWO) TIMES DAILY. DTonia Ghent MD Taking Active   losartan (COZAAR) 50 MG tablet [id number] TAKE 1 TABLET BY MOUTH  DAILY DTonia Ghent MD Taking Active   metFORMIN (GLUCOPHAGE) 500 MG tablet [id number] Take 1 tablet (500 mg total) by mouth 2 (two) times daily with a meal. DTonia Ghent MD Taking Active   simvastatin (ZOCOR) 40 MG tablet [id number] Take 1 tablet (40 mg total) by mouth at bedtime. DTonia Ghent MD Taking Active             Patient Active Problem List   Diagnosis Date Noted   Dysuria 03/21/2021   Tremor of left hand 03/21/2021   Knee pain 03/21/2021   Healthcare maintenance 03/01/2020   Osteopenia 04/15/2018   Vaginal irritation 12/31/2017   Sinus tachycardia 12/31/2017   Cervical radiculitis 09/23/2017   Advance care planning 02/07/2017   Welcome to Medicare preventive visit 03/29/2011   Transaminitis 03/29/2011   Hyperlipidemia 07/13/2010   PLANTAR FASCIITIS, LEFT 01/20/2008   Diabetes mellitus without complication (HTaylor 029/51/8841  ANXIETY 07/31/2006   Essential  hypertension 07/31/2006   ALLERGIC RHINITIS 07/31/2006   ACNE ROSACEA 04/11/2005    Immunization History  Administered Date(s) Administered  Hep A / Hep B 07/31/2012, 08/31/2012, 01/21/2013   Influenza Whole 12/23/2008, 12/15/2009   Influenza, High Dose Seasonal PF 01/03/2019   Influenza,inj,Quad PF,6+ Mos 11/25/2012, 01/08/2015, 12/29/2017   Influenza-Unspecified 12/06/2013, 12/25/2015, 12/23/2016, 01/10/2020, 11/23/2020   PFIZER Comirnaty(Gray Top)Covid-19 Tri-Sucrose Vaccine 01/17/2020   PFIZER(Purple Top)SARS-COV-2 Vaccination 04/12/2019, 05/09/2019, 01/17/2020   Pfizer Covid-19 Vaccine Bivalent Booster 55yr & up 01/21/2021   Pneumococcal Conjugate-13 12/29/2017, 01/05/2019   Pneumococcal Polysaccharide-23 12/25/2010   Td 04/11/2005   Tdap 02/06/2017   Zoster Recombinat (Shingrix) 01/28/2019, 04/05/2019   Zoster, Live 07/31/2012    Conditions to be addressed/monitored:  Hypertension, Hyperlipidemia, Diabetes, and Osteopenia  Care Plan : General Pharmacy (Adult)  Updates made by ADebbora Dus RDarfursince 04/02/2021 12:00 AM     Problem: CHL AMB "PATIENT-SPECIFIC PROBLEM"      Long-Range Goal: Patient Stated   Start Date: 04/02/2021  Priority: High  Note:   Current Barriers:  Unable to achieve control of diabetes   Pharmacist Clinical Goal(s):  Patient will contact provider office for questions/concerns as evidenced notation of same in electronic health record through collaboration with PharmD and provider.   Interventions: 1:1 collaboration with DTonia Ghent MD regarding development and update of comprehensive plan of care as evidenced by provider attestation and co-signature Inter-disciplinary care team collaboration (see longitudinal plan of care) Comprehensive medication review performed; medication list updated in electronic medical record  Hypertension (BP goal <130/80) -Controlled, per clinic BP readings within goal. She has taken losartan since 2012,  tolerates well. She does not monitor home BP. -Current treatment: Losartan 50 mg - 1 tablet daily -Medications previously tried: lisinopril (cough) -Current home readings: none  -Current exercise habits: Stopped going to gym in 2020 after COVID restrictions. She gets a free membership with insurance and she would like to get back into working out. She has 3 acres which keeps her active in the Spring/Summer. We discussed importance of daily exercise, starting with 10-15 minute increments for heart health. -Denies hypotensive/hypertensive symptoms -Recommended to continue current medication; Increase activity with goal of 10 minutes each day (brisk walk).   Hyperlipidemia: (LDL goal < 70) -Not ideally controlled, LDL 84. LDL goal < 70 per ADA 2023 recommendations for patients with diabetes, age 69-75at increased cardiovascular risk. Recommend trial of high intensity statin. However, pt is currently off medication for 10 days due to covid treatment. Will revisit at follow up. The 10-year ASCVD risk score (Arnett DK, et al., 2019) is: 17.5%   Values used to calculate the score:     Age: [age]years     Sex: Female     Is Non-Hispanic African American: No     Diabetic: Yes     Tobacco smoker: No     Systolic Blood Pressure: 1941mmHg     Is BP treated: Yes     HDL Cholesterol: 39 mg/dL     Total Cholesterol: 150 mg/dL -Current treatment: Simvastatin 40 mg - 1 tablet daily -Medications previously tried: none - pt denies previous trials of statins or intolerances -Current dietary patterns: tries to follow low carb diet  -Current exercise habits: none formal -Educated on Cholesterol goals;  -Recommended to continue current medication  Diabetes (A1c goal <7%) -Not ideally controlled, she was seeing HRoe Coombs NP but will [facility] ([facility]t March 2023). She recently saw PCP and metformin was reduced from 2000 mg/day to 1000 mg/day on 03/20/21. She  denies any noticeable improvement with dose reduction. She  also had COVID last week and thinks this may be the cause of her stomach issues. She will resume 2000 mg/day if stomach issues resolve post-covid. She reports she has never been to diabetes education classes and started going to [facility] about 1 year ago. Denies interest in education classes today. She takes on Trulicity on Mondays. She had 1 box on hand, 1 month supply. She tolerates well. She takes metformin and glimepiride with meals twice daily. She has supplies to check BG but does not monitor routinely. Monitoring causes her to worry about the numbers too much. We discussed benefits of routine monitoring, especially prior to appointments.  -Current medications: Metformin 500 mg - 1 tablet twice daily with meals  Trulicity 3 mg - Inject once weekly as directed  Glimepiride 2 mg - 1 tablet twice daily with meals  -Medications previously tried: metformin 2000 mg/day (upset stomach), Jardiance (yeast infections) -Current home glucose readings:  fasting glucose: none post prandial glucose: none -Denies hypoglycemic/hyperglycemic symptoms -Current meal patterns: She limits potatoes and rice. Dinner: mostly chicken, vegetables  Drinks: soda < 1/day, tries to avoid -Current exercise: none formal -Diabetic foot exam completed 03/20/21 per PCP -Eye exam is DUE. -Discussed importance of diabetes education/support with nutrition.  -Recommended to continue current medication; Let us know if interested in diabetes classes. Check blood glucose for 1 week prior to endocrinology visit in March.   Osteoporosis / Osteopenia (Goal ) -Controlled -Last DEXA Scan: 03/07/21  T-Score femoral neck: -1.5   T-Score total hip: n/a  T-Score lumbar spine: n/a  T-Score forearm radius: Normal 0.3  10-year probability of major osteoporotic fracture: 15.1%  10-year probability of hip fracture: 1.8% -Patient is not a candidate for pharmacologic  treatment -She fell a couple years ago on ice. No falls since then. She tries to stay very cautious. Denies any history of hip fractures. -Current treatment  Vitamin D 2000 IU - 1 tablet daily  -Medications previously tried: none   -Recommend [phone] units of vitamin D daily. Recommend 1200 mg of calcium daily from dietary and supplemental sources. Discussed calcium in diet.  -Recommended to continue current medication   Patient Goals/Self-Care Activities Patient will:  - take medications as prescribed as evidenced by patient report and record review - check blood glucose for at least 1 week (twice daily) prior to endocrinology visit in March  Follow Up Plan: Telephone follow up appointment with care management team member scheduled for: -CCM PharmD follow up in 3 months -[place] BG log review in 1 month    Medication Assistance: Application for Trulicity 3 mg  medication assistance program. in process.  Anticipated assistance start date 04/10/2021.  See plan of care for additional detail.   Compliance/Adherence/Medication fill history: Care Gaps: Eye Exam Due  Star-Rating Drugs: Medication:                Last Fill:         Day Supply Trulicity 7CW/8.8QB      02/19/2021      28 Simvastatin 40 mg      02/25/2021      90 Metformin 500 mg       02/15/2021      90         Losartan 50                 02/25/2021      90  Glimepiride 2 mg         03/05/2021      90  Patient's preferred pharmacy is:  [id number] [place] [address]Phone: [phone]: [phone] [facility] ([address] [place] [facility]Phone: [phone]: [phone] [facility] ([facility] ) - [address] [address]Phone: [phone]:  [phone] Uses pill box? Yes Pt endorses 90% compliance - may miss 1-2 doses/month She uses mail order for everything except Trulicity.  We discussed: Current pharmacy is preferred with insurance plan and patient is satisfied with pharmacy services Patient decided to: Continue current medication management strategy  Care Plan and Follow Up Patient Decision:  Patient agrees to Care Plan and Follow-up.  MDebbora Dus PharmD Clinical Pharmacist Practitioner [facility] at [phone]

## 2021-04-04 ENCOUNTER — Telehealth: Payer: Self-pay

## 2021-04-04 NOTE — Progress Notes (Signed)
While speaking with Gean Birchwood I asked about patient's forms for Trulicity patient assistance. [employer] has not received any forms for this patient. Please re-fax forms to [phone]. Please include full name, date of birth and ID #: [id number] on the fax cover sheet.   Debbora Dus, CPP notified  Marijean Niemann, [place] Clinical Pharmacy Assistant [phone]  Time Spent: 10 Minutes

## 2021-04-04 NOTE — Progress Notes (Signed)
Error

## 2021-04-05 NOTE — Telephone Encounter (Signed)
Refaxed it again to the number, hopefully this will be received this time

## 2021-04-05 NOTE — Telephone Encounter (Addendum)
Spoke with representative again today. She reports correct fax number for diabetes medications is [phone]. Representative also states its possible application has been received but not processed yet.   Debbora Dus, PharmD Clinical Pharmacist Practitioner [facility] at [facility] [phone]

## 2021-04-05 NOTE — Telephone Encounter (Signed)
I will re fax it now to the fax number listed below. I had a different fax number per their form.

## 2021-04-10 DIAGNOSIS — I10 Essential (primary) hypertension: Secondary | ICD-10-CM | POA: Diagnosis not present

## 2021-04-10 DIAGNOSIS — E785 Hyperlipidemia, unspecified: Secondary | ICD-10-CM

## 2021-04-10 DIAGNOSIS — E1165 Type 2 diabetes mellitus with hyperglycemia: Secondary | ICD-10-CM | POA: Diagnosis not present

## 2021-04-10 DIAGNOSIS — E119 Type 2 diabetes mellitus without complications: Secondary | ICD-10-CM

## 2021-04-10 NOTE — Telephone Encounter (Signed)
Spoke with patient regarding denial for Trulicity assistance. She understood. She plans to take the medication for now until coverage gap. Then alternatives will need to be considered. Not eligible for coupons due to Medicare. Income is above cut off for grants and assistance programs.  Debbora Dus, PharmD Clinical Pharmacist Practitioner [facility] at [facility] [facility] [phone]

## 2021-04-10 NOTE — Telephone Encounter (Signed)
I received fax notification from [facility] that the application was denied. States the applicant does not meet the financial criteria of the program.

## 2021-04-11 NOTE — Telephone Encounter (Signed)
Noted. Thanks.

## 2021-04-13 ENCOUNTER — Ambulatory Visit (INDEPENDENT_AMBULATORY_CARE_PROVIDER_SITE_OTHER): Payer: Medicare Other | Admitting: Family

## 2021-04-13 ENCOUNTER — Other Ambulatory Visit: Payer: Self-pay

## 2021-04-13 ENCOUNTER — Encounter: Payer: Self-pay | Admitting: Family

## 2021-04-13 VITALS — BP 164/112 | HR 119 | Temp 97.3°F | Ht 64.0 in | Wt 161.0 lb

## 2021-04-13 DIAGNOSIS — I10 Essential (primary) hypertension: Secondary | ICD-10-CM

## 2021-04-13 DIAGNOSIS — B370 Candidal stomatitis: Secondary | ICD-10-CM | POA: Diagnosis not present

## 2021-04-13 DIAGNOSIS — J029 Acute pharyngitis, unspecified: Secondary | ICD-10-CM | POA: Insufficient documentation

## 2021-04-13 DIAGNOSIS — J020 Streptococcal pharyngitis: Secondary | ICD-10-CM | POA: Diagnosis not present

## 2021-04-13 LAB — POCT RAPID STREP A (OFFICE): Rapid Strep A Screen: POSITIVE — AB

## 2021-04-13 MED ORDER — NYSTATIN 100000 UNIT/ML MT SUSP
OROMUCOSAL | 0 refills | Status: DC
Start: 2021-04-13 — End: 2021-11-16

## 2021-04-13 MED ORDER — AMOXICILLIN 500 MG PO CAPS: 500.0000 mg | ORAL_CAPSULE | Freq: Two times a day (BID) | ORAL | 0 refills | Status: AC

## 2021-04-13 NOTE — Assessment & Plan Note (Signed)
Nystatin sent to pharmacy, pt to start and take as directed. Handout given to pt on this as well.

## 2021-04-13 NOTE — Assessment & Plan Note (Signed)
Strep testing in office, positive in office.

## 2021-04-13 NOTE — Patient Instructions (Signed)
You were found to be strep positive,  Take antibiotics that have been sent to the pharmacy.  Change your toothbrush after 24 hours on the antibiotics.  Gargle with warm salt water as needed for sore throat.   Go home and take your losartan for your blood pressure please.  Start monitoring your blood pressure daily, around the same time of day, for the next few days to make sure <140/90.  Ensure that you have rested for 30 minutes prior to checking your blood pressure. Record your readings and bring them to your next visit. If your blood pressure does not come under those values please f/u with your PCP sooner.  It was a pleasure seeing you today! Please do not hesitate to reach out with any questions and or concerns.  Regards,   Eugenia Pancoast FNP-C

## 2021-04-13 NOTE — Assessment & Plan Note (Signed)
Strep tested positive in office.  rx for amox 500 mg po bid x 10 days.  Ibuprofen/tyelnol prn sore throat/fever Pt told to F/u if no improvement in the next 2-3 days.  

## 2021-04-13 NOTE — Progress Notes (Addendum)
Established Patient Office Visit  Subjective:  Patient ID: Deborah Alvarado, female    DOB: May 24, 1950  Age: 71 y.o. MRN: [id number]  CC:  Chief Complaint  Patient presents with   Sinusitis    HPI Deborah Alvarado is here today with concerns.   Two days ago started with sore throat, bil ear fullness and pain, nasal congestion. Cough especially at night, that is nonproductive. No wheezing or sob. Able to swallow but painful. Burns with drinking water, not drooling.   Did have covid two weeks ago, those symptoms have been improved for about a week, now this. She did completed paxlovid- was given this 03/27/21.  Elevated bp in exam room today, no cp palp and or sob. Pt states she has not yet taken her blood pressure medication. No headache, visual changes.   Past Medical History:  Diagnosis Date   Allergy    Anxiety    Diabetes mellitus    Type II   Hyperlipidemia    Hypertension    NSVD (normal spontaneous vaginal delivery)    x 2    Past Surgical History:  Procedure Laterality Date   TOTAL ABDOMINAL HYSTERECTOMY  01/2005   due to fibroids    Family History  Problem Relation Age of Onset   Cancer Mother        Leukemia   Diabetes Brother    Breast cancer Maternal Grandmother    Colon cancer Neg Hx     Social History   Socioeconomic History   Marital status: Married    Spouse name: Not on file   Number of children: 2   Years of education: Not on file   Highest education level: Not on file  Occupational History    Employer: [employer]  Tobacco Use   Smoking status: Never   Smokeless tobacco: Never  Substance and Sexual Activity   Alcohol use: No    Alcohol/week: 0.0 standard drinks   Drug use: No   Sexual activity: Not on file  Other Topics Concern   Not on file  Social History Narrative   Grandchild died 20-May-2022   Worked at [employer], retired 2019   Social Determinants of Radio broadcast assistant Strain: Medium Risk    Difficulty of Paying Living Expenses: Somewhat hard  Food Insecurity: No Food Insecurity   Worried About Charity fundraiser in the Last Year: Never true   Arboriculturist in the Last Year: Never true  Transportation Needs: No Transportation Needs   Lack of Transportation (Medical): No   Lack of Transportation (Non-Medical): No  Physical Activity: Inactive   Days of Exercise per Week: 0 days   Minutes of Exercise per Session: 0 min  Stress: Not on file  Social Connections: Moderately Isolated   Frequency of Communication with Friends and Family: More than three times a week   Frequency of Social Gatherings with Friends and Family: Twice a week   Attends Religious Services: Never   Marine scientist or Organizations: No   Attends Music therapist: Never   Marital Status: Married  Human resources officer Violence: Not At Risk   Fear of Current or Ex-Partner: No   Emotionally Abused: No   Physically Abused: No   Sexually Abused: No    Outpatient Medications Prior to Visit  Medication Sig Dispense Refill   B-D UF III MINI PEN NEEDLES 31G X 5 MM MISC INJECT 1 DEVICE AS DIRECTED DAILY. USE WITH INSULIN  PEN 100 each 3   Cholecalciferol (VITAMIN D) 50 MCG (2000 UT) tablet Take 1 tablet (2,000 Units total) by mouth daily.     Dulaglutide (TRULICITY) 3 DP/8.2UM SOPN Inject 3 mg as directed once a week.     glimepiride (AMARYL) 2 MG tablet TAKE 1 TABLET (2 MG TOTAL) BY MOUTH 2 (TWO) TIMES DAILY. 180 tablet 2   metFORMIN (GLUCOPHAGE) 500 MG tablet Take 1 tablet (500 mg total) by mouth 2 (two) times daily with a meal.     simvastatin (ZOCOR) 40 MG tablet Take 1 tablet (40 mg total) by mouth at bedtime. 90 tablet 3   losartan (COZAAR) 50 MG tablet TAKE 1 TABLET BY MOUTH  DAILY 90 tablet 0   No facility-administered medications prior to visit.    Allergies  Allergen Reactions   Lisinopril     Cough, presumed   Metformin And Related Other (See Comments)     Diarrhea at >1000mg  a day    ROS Review of Systems  Constitutional:  Negative for chills and fever.  HENT:  Positive for congestion, ear pain and sore throat. Negative for sinus pressure.   Respiratory:  Positive for cough (dry). Negative for shortness of breath and wheezing.   Cardiovascular:  Negative for chest pain and palpitations.     Objective:    Physical Exam Constitutional:      General: She is not in acute distress.    Appearance: Normal appearance. She is normal weight. She is not ill-appearing, toxic-appearing or diaphoretic.  HENT:     Head: Normocephalic.     Right Ear: Tympanic membrane normal.     Left Ear: Tympanic membrane normal.     Nose: Nose normal.     Mouth/Throat:     Mouth: Mucous membranes are moist.     Tongue: Lesions present.     Pharynx: Posterior oropharyngeal erythema present.     Tonsils: No tonsillar exudate or tonsillar abscesses. 1+ on the right.     Comments: White budding on top of base of erythema bil lateral aspect of tongue Eyes:     Pupils: Pupils are equal, round, and reactive to light.  Cardiovascular:     Rate and Rhythm: Normal rate and regular rhythm.  Pulmonary:     Effort: Pulmonary effort is normal.     Breath sounds: Normal breath sounds.  Neurological:     Mental Status: She is alert.    BP (!) 164/112    Pulse (!) 119    Temp (!) 97.3 F (36.3 C) (Temporal)    Ht 5\' 4"  (1.626 m)    Wt 161 lb (73 kg)    SpO2 97%    BMI 27.64 kg/m  Wt Readings from Last 3 Encounters:  04/26/21 160 lb (72.6 kg)  04/13/21 161 lb (73 kg)  03/20/21 161 lb (73 kg)     Health Maintenance Due  Topic Date Due   Pneumonia Vaccine 75+ Years old (3 - PPSV23 if available, else PCV20) 01/05/2020   OPHTHALMOLOGY EXAM  03/22/2021    There are no preventive care reminders to display for this patient.  Lab Results  Component Value Date   TSH 2.91 10/05/2008   Lab Results  Component Value Date   WBC 5.2 10/05/2008   HGB 13.8  10/05/2008   HCT 40.6 10/05/2008   MCV 87.0 10/05/2008   PLT 220.0 10/05/2008   Lab Results  Component Value Date   NA 136 03/20/2021   K 4.7 03/20/2021  CO2 26 03/20/2021   GLUCOSE 188 (H) 03/20/2021   BUN 15 03/20/2021   CREATININE 0.81 03/20/2021   BILITOT 0.5 03/20/2021   ALKPHOS 78 03/20/2021   AST 28 03/20/2021   ALT 32 03/20/2021   PROT 7.0 03/20/2021   ALBUMIN 4.5 03/20/2021   CALCIUM 10.0 03/20/2021   GFR 74.47 03/20/2021   Lab Results  Component Value Date   HGBA1C 8.8 (H) 03/20/2021      Assessment & Plan:   Problem List Items Addressed This Visit       Cardiovascular and Mediastinum   Essential hypertension    Pt advised of the following:  Go home and take medication as prescribed. Monitor blood pressure twice daily and/or when you feel symptomatic. Goal is <130/90 on average. Ensure that you have rested for 30 minutes prior to checking your blood pressure. Record your readings and bring them to your next visit if necessary.work on a low sodium diet. Call earlier for follow up if next few days with elevated readings.          Respiratory   Strep pharyngitis    Strep tested positive in office.  rx for amox 500 mg po bid x 10 days.  Ibuprofen/tyelnol prn sore throat/fever Pt told to F/u if no improvement in the next 2-3 days.        Relevant Medications   nystatin (MYCOSTATIN) 100000 UNIT/ML suspension     Digestive   Oral thrush - Primary    Nystatin sent to pharmacy, pt to start and take as directed. Handout given to pt on this as well.       Relevant Medications   nystatin (MYCOSTATIN) 100000 UNIT/ML suspension     Other   Sore throat    Strep testing in office, positive in office.       Relevant Orders   POCT rapid strep A (Completed)    Meds ordered this encounter  Medications   nystatin (MYCOSTATIN) 100000 UNIT/ML suspension    Sig: Take 5 ml po every 6 hours for seven days, swish around and retain in mouth for up to 30  seconds, then spit out.    Dispense:  60 mL    Refill:  0    Order Specific Question:   Supervising Provider    Answer:   BEDSOLE, AMY E [[id number]]   amoxicillin (AMOXIL) 500 MG capsule    Sig: Take 1 capsule (500 mg total) by mouth 2 (two) times daily for 10 days.    Dispense:  30 capsule    Refill:  0    Order Specific Question:   Supervising Provider    Answer:   Diona Browner, AMY E [[id number]]    Follow-up: Return in 1 week (on 04/20/2021), or if symptoms worsen or fail to improve, for with pcp for blood pressure log follow up.    Deborah Pancoast, FNP

## 2021-04-19 ENCOUNTER — Telehealth: Payer: Self-pay

## 2021-04-19 DIAGNOSIS — E119 Type 2 diabetes mellitus without complications: Secondary | ICD-10-CM | POA: Diagnosis not present

## 2021-04-19 LAB — HM DIABETES EYE EXAM

## 2021-04-19 NOTE — Progress Notes (Signed)
° ° °  Chronic Care Management Pharmacy Assistant   Name: LOLA CZERWONKA  MRN: [id number] DOB: [birth year]  Reason for Encounter: CCM (Trulicity 8921 Approved)   Originally [facility] stated patient was denied for Trulicity patient assistance. While on the phone with Gean Birchwood I asked patient again. She is approved from 04/18/2021 - 03/10/2022. Medication will come from [facility] [phone]. I called patient to inform her. Patient voiced understanding. I gave patient my direct number in case she needs anything.   Debbora Dus, CPP notified  Marijean Niemann, [place] Clinical Pharmacy Assistant [phone]  Time Spent: 10 Minutes

## 2021-04-22 ENCOUNTER — Other Ambulatory Visit: Payer: Self-pay | Admitting: Family Medicine

## 2021-04-24 ENCOUNTER — Telehealth: Payer: Self-pay | Admitting: Family Medicine

## 2021-04-24 ENCOUNTER — Other Ambulatory Visit: Payer: Self-pay | Admitting: *Deleted

## 2021-04-24 NOTE — Telephone Encounter (Signed)
°  Encourage patient to contact the pharmacy for refills or they can request refills through [facility]:  Please schedule appointment if longer than 1 year  NEXT APPOINTMENT DATE:  MEDICATION: Amoxicillin  Is the patient out of medication? yes  PH[facility]AC[facility]   Let patient know to contact pharmacy at the end of the day to make sure medication is ready.  Please notify patient to allow 48-72 hours to process  CLINICAL FILLS OUT ALL BELOW:   LAST REFILL:  QTY:  REFILL DATE:    OTHER COMMENTS:    Okay for refill?  Please advise

## 2021-04-25 ENCOUNTER — Telehealth: Payer: Self-pay

## 2021-04-25 NOTE — Progress Notes (Addendum)
Chronic Care Management Pharmacy Assistant   Name: SHAKEDRA BEAM  MRN: [id number] DOB: Nov 13, 1950  Reason for Encounter: CCM (Diabetes Disease State)   Recent office visits:  None since last CCM contact  Recent consult visits:  04/13/2021 - Eugenia Pancoast, NP - Family Medicine - Patient presented for strep throat, thrush and sinusitis. Start: amoxicillin (AMOXIL) 500 MG capsule. Start: nystatin (MYCOSTATIN) 100000 UNIT/ML suspension.  Hospital visits:  None in previous 6 months  Medications: Outpatient Encounter Medications as of 04/25/2021  Medication Sig   B-D UF III MINI PEN NEEDLES 31G X 5 MM MISC INJECT 1 DEVICE AS DIRECTED DAILY. USE WITH INSULIN PEN   Cholecalciferol (VITAMIN D) 50 MCG (2000 UT) tablet Take 1 tablet (2,000 Units total) by mouth daily.   Dulaglutide (TRULICITY) 3 IE/3.3IR SOPN Inject 3 mg as directed once a week.   glimepiride (AMARYL) 2 MG tablet TAKE 1 TABLET (2 MG TOTAL) BY MOUTH 2 (TWO) TIMES DAILY.   losartan (COZAAR) 50 MG tablet TAKE 1 TABLET BY MOUTH DAILY   metFORMIN (GLUCOPHAGE) 500 MG tablet Take 1 tablet (500 mg total) by mouth 2 (two) times daily with a meal.   nystatin (MYCOSTATIN) 100000 UNIT/ML suspension Take 5 ml po every 6 hours for seven days, swish around and retain in mouth for up to 30 seconds, then spit out.   simvastatin (ZOCOR) 40 MG tablet Take 1 tablet (40 mg total) by mouth at bedtime.   No facility-administered encounter medications on file as of 04/25/2021.   Recent Relevant Labs: Lab Results  Component Value Date/Time   HGBA1C 8.8 (H) 03/20/2021 11:44 AM   HGBA1C 9.8 11/11/2019 12:00 AM   HGBA1C 10.6 (H) 12/29/2017 11:52 AM   MICROALBUR 1.0 10/05/2008 09:54 AM   MICROALBUR 1.7 07/16/2007 09:40 AM    Kidney Function Lab Results  Component Value Date/Time   CREATININE 0.81 03/20/2021 11:44 AM   CREATININE 0.84 12/29/2017 11:52 AM   GFR 74.47 03/20/2021 11:44 AM   GFRNONAA 68.49 01/02/2010 08:19 AM   GFRAA 112  07/16/2007 09:40 AM   Contacted patient on 04/25/2021 to discuss diabetes disease state.   Current antihyperglycemic regimen:  Metformin 500 mg - 1 tablet twice daily with meals Trulicity 3 mg - Inject 3 mg as directed once a week Glimepiride 2 mg - 1 tablet by mouth 2 times daily   Patient verbally confirms she is taking the above medications as directed. Yes  What diet changes have been made to improve diabetes control? No diet changes.   What recent interventions/DTPs have been made to improve glycemic control:  No recent interventions  Have there been any recent hospitalizations or ED visits since last visit with CPP? No  Patient denies hypoglycemic symptoms, including Pale, Sweaty, Shaky, Hungry, Nervous/irritable, and Vision changes  Patient denies hyperglycemic symptoms, including blurry vision, excessive thirst, fatigue, polyuria, and weakness  How often are you checking your blood sugar?  Very infrequently  I have asked patient to check her blood sugar two times daily starting tomorrow. Advised patient I would call back on 02/24 for her log. Patient understood and agreed.   During the week, how often does your blood glucose drop below 70? Never  Are you checking your feet daily/regularly? Yes  Adherence Review: Is the patient currently on a STATIN medication? Yes Is the patient currently on ACE/ARB medication? Yes Does the patient have >5 day gap between last estimated fill dates? No  Care Gaps: Annual wellness visit in last  year? Yes 01/23/2021 Most recent A1C reading: 8.8 on 03/20/2021 Most Recent BP reading: 164/112 on 04/13/2021  Last eye exam / retinopathy screening: 03/22/2020 Last diabetic foot exam: Up to date  Star Rating Drugs:  Medication:  Last Fill: Day Supply Losartan 50 mg 55/20/8022 90  Trulicity 3 mg  33/61/2244 28   Simvastatin 40 mg 02/25/2021 90 Metformin 500 mg 02/15/2021 90 Glimepiride 2 mg 03/05/2021 90  04/26/2021 appointment with  Eugenia Pancoast, NP for yeast infection.   Debbora Dus, CPP notified  Marijean Niemann, [place] Clinical Pharmacy Assistant [phone]  I have reviewed the care management and care coordination activities outlined in this encounter and I am certifying that I agree with the content of this note. See addendum.  Debbora Dus, PharmD Clinical Pharmacist [facility] at [facility] [phone]

## 2021-04-25 NOTE — Telephone Encounter (Signed)
oke to pt today--stated last visit been prescribe Amoxicillin and start developing yeast infection. Pt requesting medication for yeast infection to save trip for tomorrow. appt .

## 2021-04-26 ENCOUNTER — Ambulatory Visit (INDEPENDENT_AMBULATORY_CARE_PROVIDER_SITE_OTHER): Payer: Medicare Other | Admitting: Family

## 2021-04-26 ENCOUNTER — Other Ambulatory Visit: Payer: Self-pay

## 2021-04-26 ENCOUNTER — Encounter: Payer: Self-pay | Admitting: Family

## 2021-04-26 VITALS — BP 148/74 | HR 88 | Ht 64.0 in | Wt 160.0 lb

## 2021-04-26 DIAGNOSIS — B3731 Acute candidiasis of vulva and vagina: Secondary | ICD-10-CM | POA: Diagnosis not present

## 2021-04-26 DIAGNOSIS — N898 Other specified noninflammatory disorders of vagina: Secondary | ICD-10-CM | POA: Diagnosis not present

## 2021-04-26 DIAGNOSIS — J020 Streptococcal pharyngitis: Secondary | ICD-10-CM

## 2021-04-26 MED ORDER — FLUCONAZOLE 150 MG PO TABS: ORAL_TABLET | ORAL | 0 refills | Status: DC

## 2021-04-26 MED ORDER — AMOXICILLIN 500 MG PO CAPS: 500.0000 mg | ORAL_CAPSULE | Freq: Two times a day (BID) | ORAL | 0 refills | Status: AC

## 2021-04-26 NOTE — Patient Instructions (Addendum)
I have sent a refill of your amoxicillin since you lost half of the bottle, please restart.  Also I have sent your diflucan to your pharmacy for your yeast infection. While you take the diflucan temporarily hold your simvastatin.   It was a pleasure seeing you today! Please do not hesitate to reach out with any questions and or concerns.   Regards,   Eugenia Pancoast FNP-C

## 2021-04-26 NOTE — Telephone Encounter (Signed)
Pt seen today and addressed.

## 2021-04-26 NOTE — Telephone Encounter (Addendum)
Thank you. When you call back, please ask patient to confirm metformin dose. She went down to 500 mg BID due to upset stomach but at last visit she stated she did not think the stomach pain was related to the metformin. If she agrees, we can increase back to 500 mg (2 tablets) twice daily if fasting BG is above 150.  Debbora Dus, PharmD Clinical Pharmacist Practitioner [facility] at [facility] [phone]

## 2021-04-26 NOTE — Assessment & Plan Note (Signed)
Refill sent for amoxicillin since patient displaced half of her medication.  Patient to restart and take.  Patient to follow-up if no improvement in her symptoms

## 2021-04-26 NOTE — Progress Notes (Signed)
Established Patient Office Visit  Subjective:  Patient ID: Deborah Alvarado, female    DOB: 30-Apr-1950  Age: 71 y.o. MRN: [id number]  CC:  Chief Complaint  Patient presents with   Vaginitis    Pt stated--vaginal itch..No flowsheet data found. 6 days after taking the amoxicillin.       HPI Deborah Alvarado is here today with concerns.   Recently was taking amoxicillin for strep , and about six days after taking started with vaginal itching. She is feeling much better but still with slight lingering sore throat. She did misplace half of her amoxicillin so requesting I sent it again so she can complete duration of therapy.   The vaginal itching is present, no vaginal discharge.  She states this feels somewhat similar to a yeast infection in the past.  She denies lower abdominal pain denies urinary symptoms.  Past Medical History:  Diagnosis Date   Allergy    Anxiety    Diabetes mellitus    Type II   Hyperlipidemia    Hypertension    NSVD (normal spontaneous vaginal delivery)    x 2    Past Surgical History:  Procedure Laterality Date   TOTAL ABDOMINAL HYSTERECTOMY  01/2005   due to fibroids    Family History  Problem Relation Age of Onset   Cancer Mother        Leukemia   Diabetes Brother    Breast cancer Maternal Grandmother    Colon cancer Neg Hx     Social History   Socioeconomic History   Marital status: Married    Spouse name: Not on file   Number of children: 2   Years of education: Not on file   Highest education level: Not on file  Occupational History    Employer: [employer]  Tobacco Use   Smoking status: Never   Smokeless tobacco: Never  Substance and Sexual Activity   Alcohol use: No    Alcohol/week: 0.0 standard drinks   Drug use: No   Sexual activity: Not on file  Other Topics Concern   Not on file  Social History Narrative   Grandchild died 2022-05-19   Worked at [employer], retired 2019   Social Determinants of Systems developer Strain: Medium Risk   Difficulty of Paying Living Expenses: Somewhat hard  Food Insecurity: No Food Insecurity   Worried About Charity fundraiser in the Last Year: Never true   Arboriculturist in the Last Year: Never true  Transportation Needs: No Transportation Needs   Lack of Transportation (Medical): No   Lack of Transportation (Non-Medical): No  Physical Activity: Inactive   Days of Exercise per Week: 0 days   Minutes of Exercise per Session: 0 min  Stress: Not on file  Social Connections: Moderately Isolated   Frequency of Communication with Friends and Family: More than three times a week   Frequency of Social Gatherings with Friends and Family: Twice a week   Attends Religious Services: Never   Marine scientist or Organizations: No   Attends Music therapist: Never   Marital Status: Married  Human resources officer Violence: Not At Risk   Fear of Current or Ex-Partner: No   Emotionally Abused: No   Physically Abused: No   Sexually Abused: No    Outpatient Medications Prior to Visit  Medication Sig Dispense Refill   B-D UF III MINI PEN NEEDLES 31G X 5 MM MISC INJECT  1 DEVICE AS DIRECTED DAILY. USE WITH INSULIN PEN 100 each 3   Cholecalciferol (VITAMIN D) 50 MCG (2000 UT) tablet Take 1 tablet (2,000 Units total) by mouth daily.     Dulaglutide (TRULICITY) 3 EH/6.3JS SOPN Inject 3 mg as directed once a week.     glimepiride (AMARYL) 2 MG tablet TAKE 1 TABLET (2 MG TOTAL) BY MOUTH 2 (TWO) TIMES DAILY. 180 tablet 2   losartan (COZAAR) 50 MG tablet TAKE 1 TABLET BY MOUTH DAILY 90 tablet 3   metFORMIN (GLUCOPHAGE) 500 MG tablet Take 1 tablet (500 mg total) by mouth 2 (two) times daily with a meal.     nystatin (MYCOSTATIN) 100000 UNIT/ML suspension Take 5 ml po every 6 hours for seven days, swish around and retain in mouth for up to 30 seconds, then spit out. 60 mL 0   simvastatin (ZOCOR) 40 MG tablet Take 1 tablet (40 mg total) by mouth at bedtime. 90 tablet  3   No facility-administered medications prior to visit.    Allergies  Allergen Reactions   Lisinopril     Cough, presumed   Metformin And Related Other (See Comments)    Diarrhea at >1000mg  a day    ROS Review of Systems  Constitutional:  Negative for chills, fatigue and fever.  HENT:  Positive for sore throat. Negative for congestion, ear pain and sinus pressure. Voice change: urin anc.  Cardiovascular:  Negative for chest pain and palpitations.  Genitourinary:  Negative for difficulty urinating, dysuria, vaginal bleeding and vaginal discharge.       Vaginal itching and discomfort     Objective:    Physical Exam Constitutional:      General: She is not in acute distress.    Appearance: Normal appearance. She is normal weight. She is not ill-appearing, toxic-appearing or diaphoretic.  HENT:     Right Ear: Tympanic membrane normal.     Left Ear: Tympanic membrane normal.     Nose: Nose normal.     Mouth/Throat:     Mouth: Mucous membranes are moist.     Pharynx: Posterior oropharyngeal erythema present.  Eyes:     Pupils: Pupils are equal, round, and reactive to light.  Pulmonary:     Effort: Pulmonary effort is normal.  Neurological:     General: No focal deficit present.     Mental Status: She is alert and oriented to person, place, and time.    BP (!) 148/74    Pulse 88    Ht 5\' 4"  (1.626 m)    Wt 160 lb (72.6 kg)    SpO2 98%    BMI 27.46 kg/m  Wt Readings from Last 3 Encounters:  04/26/21 160 lb (72.6 kg)  04/13/21 161 lb (73 kg)  03/20/21 161 lb (73 kg)     Health Maintenance Due  Topic Date Due   Pneumonia Vaccine 78+ Years old (3 - PPSV23 if available, else PCV20) 01/05/2020   OPHTHALMOLOGY EXAM  03/22/2021    There are no preventive care reminders to display for this patient.  Lab Results  Component Value Date   TSH 2.91 10/05/2008   Lab Results  Component Value Date   WBC 5.2 10/05/2008   HGB 13.8 10/05/2008   HCT 40.6 10/05/2008   MCV  87.0 10/05/2008   PLT 220.0 10/05/2008   Lab Results  Component Value Date   NA 136 03/20/2021   K 4.7 03/20/2021   CO2 26 03/20/2021   GLUCOSE 188 (H)  03/20/2021   BUN 15 03/20/2021   CREATININE 0.81 03/20/2021   BILITOT 0.5 03/20/2021   ALKPHOS 78 03/20/2021   AST 28 03/20/2021   ALT 32 03/20/2021   PROT 7.0 03/20/2021   ALBUMIN 4.5 03/20/2021   CALCIUM 10.0 03/20/2021   GFR 74.47 03/20/2021   Lab Results  Component Value Date   HGBA1C 8.8 (H) 03/20/2021      Assessment & Plan:   Problem List Items Addressed This Visit       Respiratory   Strep pharyngitis - Primary    Refill sent for amoxicillin since patient displaced half of her medication.  Patient to restart and take.  Patient to follow-up if no improvement in her symptoms      Relevant Medications   amoxicillin (AMOXIL) 500 MG capsule   fluconazole (DIFLUCAN) 150 MG tablet     Genitourinary   Vaginal candida   Relevant Medications   fluconazole (DIFLUCAN) 150 MG tablet     Other   Vaginal irritation    Diflucan sent to pharmacy patient to start follow-up if no improvement       Meds ordered this encounter  Medications   amoxicillin (AMOXIL) 500 MG capsule    Sig: Take 1 capsule (500 mg total) by mouth 2 (two) times daily for 10 days.    Dispense:  20 capsule    Refill:  0    Order Specific Question:   Supervising Provider    Answer:   BEDSOLE, AMY E [[id number]]   fluconazole (DIFLUCAN) 150 MG tablet    Sig: Take one po qd for one dose, repeat again in three days if still with symptoms, repeat again in three days if still with symptoms    Dispense:  3 tablet    Refill:  0    Order Specific Question:   Supervising Provider    Answer:   BEDSOLE, AMY E [[id number]]    Follow-up: Return if symptoms worsen or fail to improve.    Eugenia Pancoast, FNP

## 2021-04-26 NOTE — Assessment & Plan Note (Signed)
Diflucan sent to pharmacy patient to start follow-up if no improvement

## 2021-04-27 NOTE — Assessment & Plan Note (Signed)
Pt advised of the following:  Go home and take medication as prescribed. Monitor blood pressure twice daily and/or when you feel symptomatic. Goal is <130/90 on average. Ensure that you have rested for 30 minutes prior to checking your blood pressure. Record your readings and bring them to your next visit if necessary.work on a low sodium diet. Call earlier for follow up if next few days with elevated readings.

## 2021-05-04 NOTE — Progress Notes (Signed)
Called patient to retrieve her blood glucose log as previously discussed. Patient did not have any readings. Patient stated she was going to see her diabetes doctor on March 9. I asked patient to taker her blood sugar readings and keep a log. I asked her to take the log to her doctor appointment. Patient agreed and understood.  I also confirmed that she is taking her Metformin 500 mg 1 tablet in the morning and 1 tablet in the evening due to stomach pain being caused by the Metformin.    Charlene Brooke, CPP notified  Marijean Niemann, [place] Clinical Pharmacy Assistant [phone]  Time Spent: 10 Minutes

## 2021-05-17 DIAGNOSIS — E1159 Type 2 diabetes mellitus with other circulatory complications: Secondary | ICD-10-CM | POA: Diagnosis not present

## 2021-05-17 DIAGNOSIS — E1169 Type 2 diabetes mellitus with other specified complication: Secondary | ICD-10-CM | POA: Diagnosis not present

## 2021-05-17 DIAGNOSIS — Z8639 Personal history of other endocrine, nutritional and metabolic disease: Secondary | ICD-10-CM | POA: Diagnosis not present

## 2021-05-17 DIAGNOSIS — E785 Hyperlipidemia, unspecified: Secondary | ICD-10-CM | POA: Diagnosis not present

## 2021-05-17 DIAGNOSIS — E1165 Type 2 diabetes mellitus with hyperglycemia: Secondary | ICD-10-CM | POA: Diagnosis not present

## 2021-05-17 DIAGNOSIS — I152 Hypertension secondary to endocrine disorders: Secondary | ICD-10-CM | POA: Diagnosis not present

## 2021-05-29 DIAGNOSIS — E119 Type 2 diabetes mellitus without complications: Secondary | ICD-10-CM | POA: Diagnosis not present

## 2021-06-28 ENCOUNTER — Telehealth: Payer: Self-pay

## 2021-06-28 NOTE — Progress Notes (Signed)
? ? ?  Chronic Care Management ?Pharmacy Assistant  ? ?Name: Deborah Alvarado  MRN: [id number] DOB: 02/15/1951 ? ?Reason for Encounter: CCM Counsellor) ?  ?Medications: ?Outpatient Encounter Medications as of 06/28/2021  ?Medication Sig  ? B-D UF III MINI PEN NEEDLES 31G X 5 MM MISC INJECT 1 DEVICE AS DIRECTED DAILY. USE WITH INSULIN PEN  ? Cholecalciferol (VITAMIN D) 50 MCG (2000 UT) tablet Take 1 tablet (2,000 Units total) by mouth daily.  ? Dulaglutide (TRULICITY) 3 AJ/2.8NO SOPN Inject 3 mg as directed once a week.  ? fluconazole (DIFLUCAN) 150 MG tablet Take one po qd for one dose, repeat again in three days if still with symptoms, repeat again in three days if still with symptoms  ? glimepiride (AMARYL) 2 MG tablet TAKE 1 TABLET (2 MG TOTAL) BY MOUTH 2 (TWO) TIMES DAILY.  ? losartan (COZAAR) 50 MG tablet TAKE 1 TABLET BY MOUTH DAILY  ? metFORMIN (GLUCOPHAGE) 500 MG tablet Take 1 tablet (500 mg total) by mouth 2 (two) times daily with a meal.  ? nystatin (MYCOSTATIN) 100000 UNIT/ML suspension Take 5 ml po every 6 hours for seven days, swish around and retain in mouth for up to 30 seconds, then spit out.  ? simvastatin (ZOCOR) 40 MG tablet Take 1 tablet (40 mg total) by mouth at bedtime.  ? ?No facility-administered encounter medications on file as of 06/28/2021.  ? ?[name] was contacted to remind of upcoming telephone visit with Charlene Brooke on 07/03/2021 at 8:45. Patient was reminded to have any blood glucose and blood pressure readings available for review at appointment.  ? ?Message was left reminding patient of appointment. ? ?Star Rating Drugs: ?Medication:  Last Fill: Day Supply ?Losartan 50 mg 05/22/2021 100  ?Trulicity 3 mg  PAP ?Simvastatin 40 mg 05/22/2021 100 ?Metfromin 500 mg 05/11/2021 360 ?Fill dates verified with [facility] ? ?Charlene Brooke, CPP notified ? ?Marijean Niemann, RMA ?Clinical Pharmacy Assistant ?[phone] ? ? ? ? ?

## 2021-07-03 ENCOUNTER — Ambulatory Visit (INDEPENDENT_AMBULATORY_CARE_PROVIDER_SITE_OTHER): Payer: Medicare Other | Admitting: Pharmacist

## 2021-07-03 DIAGNOSIS — M8580 Other specified disorders of bone density and structure, unspecified site: Secondary | ICD-10-CM

## 2021-07-03 DIAGNOSIS — E785 Hyperlipidemia, unspecified: Secondary | ICD-10-CM

## 2021-07-03 DIAGNOSIS — I10 Essential (primary) hypertension: Secondary | ICD-10-CM

## 2021-07-03 DIAGNOSIS — E1165 Type 2 diabetes mellitus with hyperglycemia: Secondary | ICD-10-CM

## 2021-07-03 NOTE — Progress Notes (Signed)
? ?Chronic Care Management ?Pharmacy Note ? ?07/08/2021 ?Name:  Deborah Alvarado MRN:  [id number] DOB:  May 11, 1950 ? ?Summary: CCM F/U visit ?-Reviewed all medications and the following conditions: HTN, DM, HLD, and osteopenia. No barriers to adherence identified.  ?-BP at goal (117/80 at home) on losartan ?-A1c 8.8% (03/2021), however pt reports home health nurse checked it in Feb/March and it was 6.3%. She is not monitoring sugars at home due to not knowing what the numbers mean/what her goals are. Discussed BG goals (Fasting < 130, post-prandial < 180). She has f/u appt with endocrine in June. She did increase metformin back to 2 tablets (1000 mg) twice daily ? ?Recommendations/Changes made from today's visit: ?-No med changes ?-Recommended to check BG several times per week, alternate fasting and post-prandial ? ?Plan: ?-[place] will call patient 1 month for DM update ?-Pharmacist follow up televisit scheduled for 3 months ? ? ? ?Subjective: ?Deborah Alvarado is an 71 y.o. year old female who is a primary patient of [name], Elveria Rising, MD.  The CCM team was consulted for assistance with disease management and care coordination needs.   ? ?Engaged with patient by telephone for follow up visit in response to provider referral for pharmacy case management and/or care coordination services.  ? ?Consent to Services:  ?The patient was given information about Chronic Care Management services, agreed to services, and gave verbal consent prior to initiation of services.  Please see initial visit note for detailed documentation.  ? ?Patient Care Team: ?Tonia Ghent, MD as PCP - General ?Lonia Farber, MD as Consulting Physician (Internal Medicine) ?Eulogio Bear, MD as Consulting Physician (Ophthalmology) ?Mitchael Luckey, Cleaster Corin, [facility] as Pharmacist (Pharmacist) ? ?Recent office visits: ?04/26/21 NP Tabitha Dugal OV: vaginal candida, strep. Rx'd Amoxicillin and Fluconazole. ? ?04/13/21 NP Tabitha Dugal OV:  oral thrush, sinusitis. Rx'd Amoxicillin, Nystatin rinse. ?Recent consult visits: ?05/17/21 Dr Ladell Pier (Endocrine): f/u DM. Pt reports A1c 6.3 per home health recently, unclear how accurate this is given A1c 8.8% in January and no changes since then. Advised to check sugar BID and call office in a week. F/U 3-4 months. ? ?Hospital visits: ?None in previous 6 months ? ? ?Objective: ? ?Lab Results  ?Component Value Date  ? CREATININE 0.81 03/20/2021  ? BUN 15 03/20/2021  ? GFR 74.47 03/20/2021  ? GFRNONAA 68.49 01/02/2010  ? GFRAA 112 07/16/2007  ? NA 136 03/20/2021  ? K 4.7 03/20/2021  ? CALCIUM 10.0 03/20/2021  ? CO2 26 03/20/2021  ? GLUCOSE 188 (H) 03/20/2021  ? ? ?Lab Results  ?Component Value Date/Time  ? HGBA1C 8.8 (H) 03/20/2021 11:44 AM  ? HGBA1C 9.8 11/11/2019 12:00 AM  ? HGBA1C 10.6 (H) 12/29/2017 11:52 AM  ? GFR 74.47 03/20/2021 11:44 AM  ? GFR 72.23 12/29/2017 11:52 AM  ? MICROALBUR 1.0 10/05/2008 09:54 AM  ? MICROALBUR 1.7 07/16/2007 09:40 AM  ?  ?Last diabetic Eye exam:  ?Lab Results  ?Component Value Date/Time  ? HMDIABEYEEXA No Retinopathy 04/19/2021 12:00 AM  ?  ?Last diabetic Foot exam:  ?Lab Results  ?Component Value Date/Time  ? HMDIABFOOTEX yes 12/15/2009 12:00 AM  ?  ? ?Lab Results  ?Component Value Date  ? CHOL 150 03/20/2021  ? HDL 39.00 (L) 03/20/2021  ? [place] 97 02/06/2017  ? LDLDIRECT 84.0 03/20/2021  ? TRIG 277.0 (H) 03/20/2021  ? CHOLHDL 4 03/20/2021  ? ? ? ?  Latest Ref Rng & Units 03/20/2021  ? 11:44 AM  12/29/2017  ? 11:52 AM 02/06/2017  ?  8:51 AM  ?Hepatic Function  ?Total Protein 6.0 - 8.3 g/dL 7.0   7.2   6.9    ?Albumin 3.5 - 5.2 g/dL 4.5   4.5   4.3    ?AST 0 - 37 U/L 28   21   35    ?ALT 0 - 35 U/L 32   30   43    ?Alk Phosphatase 39 - 117 U/L 78   96   86    ?Total Bilirubin 0.2 - 1.2 mg/dL 0.5   0.6   0.6    ? ? ?Lab Results  ?Component Value Date/Time  ? TSH 2.91 10/05/2008 09:54 AM  ? TSH 2.10 07/16/2007 09:40 AM  ? ? ? ?  Latest Ref Rng & Units 10/05/2008  ?  9:54 AM  ?CBC   ?WBC 4.5 - 10.5 10*3/microliter 5.2    ?Hemoglobin 12.0 - 15.0 g/dL 13.8    ?Hematocrit 36.0 - 46.0 % 40.6    ?Platelets 150.0 - 400.0 K/uL 220.0    ? ? ?Lab Results  ?Component Value Date/Time  ? VD25OH 45.56 03/20/2021 11:44 AM  ? VD25OH 39.94 04/17/2018 11:12 AM  ? ? ?Clinical ASCVD: No  ?The 10-year ASCVD risk score (Arnett DK, et al., 2019) is: 19.6% ?  Values used to calculate the score: ?    Age: 11 years ?    Sex: Female ?    Is Non-Hispanic African American: No ?    Diabetic: Yes ?    Tobacco smoker: No ?    Systolic Blood Pressure: 893 mmHg ?    Is BP treated: Yes ?    HDL Cholesterol: 39 mg/dL ?    Total Cholesterol: 150 mg/dL   ? ? ?  01/23/2021  ?  1:28 PM 02/28/2020  ?  2:31 PM 12/29/2017  ? 11:05 AM  ?Depression screen PHQ 2/9  ?Decreased Interest 0 0 0  ?Down, Depressed, Hopeless 0 0 0  ?PHQ - 2 Score 0 0 0  ?  ? ?Social History  ? ?Tobacco Use  ?Smoking Status Never  ?Smokeless Tobacco Never  ? ?BP Readings from Last 3 Encounters:  ?04/26/21 (!) 148/74  ?04/13/21 (!) 164/112  ?03/20/21 124/82  ? ?Pulse Readings from Last 3 Encounters:  ?04/26/21 88  ?04/13/21 (!) 119  ?03/20/21 88  ? ?Wt Readings from Last 3 Encounters:  ?04/26/21 160 lb (72.6 kg)  ?04/13/21 161 lb (73 kg)  ?03/20/21 161 lb (73 kg)  ? ?BMI Readings from Last 3 Encounters:  ?04/26/21 27.46 kg/m?  ?04/13/21 27.64 kg/m?  ?03/26/21 27.64 kg/m?  ? ? ?Assessment/Interventions: Review of patient past medical history, allergies, medications, health status, including review of consultants reports, laboratory and other test data, was performed as part of comprehensive evaluation and provision of chronic care management services.  ? ?SDOH:  (Social Determinants of Health) assessments and interventions performed: No - done Dec 2022-Feb 2023 ? ?SDOH Screenings  ? ?Alcohol Screen: Low Risk   ? Last Alcohol Screening Score (AUDIT): 0  ?Depression (PHQ2-9): Low Risk   ? PHQ-2 Score: 0  ?Financial Resource Strain: Medium Risk  ? Difficulty of  Paying Living Expenses: Somewhat hard  ?Food Insecurity: No Food Insecurity  ? Worried About Charity fundraiser in the Last Year: Never true  ? Ran Out of Food in the Last Year: Never true  ?Housing: Low Risk   ? Last Housing Risk Score: 0  ?  Physical Activity: Inactive  ? Days of Exercise per Week: 0 days  ? Minutes of Exercise per Session: 0 min  ?Social Connections: Moderately Isolated  ? Frequency of Communication with Friends and Family: More than three times a week  ? Frequency of Social Gatherings with Friends and Family: Twice a week  ? Attends Religious Services: Never  ? Active Member of Clubs or Organizations: No  ? Attends Archivist Meetings: Never  ? Marital Status: Married  ?Stress: Not on file  ?Tobacco Use: Low Risk   ? Smoking Tobacco Use: Never  ? Smokeless Tobacco Use: Never  ? Passive Exposure: Not on file  ?Transportation Needs: No Transportation Needs  ? Lack of Transportation (Medical): No  ? Lack of Transportation (Non-Medical): No  ? ? ?CCM Care Plan ? ?Allergies  ?Allergen Reactions  ? Lisinopril   ?  Cough, presumed  ? Metformin And Related Other (See Comments)  ?  Diarrhea at >1066m a day  ? ? ?Medications Reviewed Today   ? ? Reviewed by [facility]Charlton Haws [facility](Pharmacist) on 07/03/21 at 0[place]ulmevilleList Status: <None>  ? ?Medication Order Taking? Sig Documenting Provider Last Dose Status Informant  ?B-D UF III MINI PEN NEEDLES 31G X 5 MM MISC [id number] INJECT 1 DEVICE AS DIRECTED DAILY. USE WITH INSULIN PEN DTonia Ghent MD Taking Active   ?Cholecalciferol (VITAMIN D) 50 MCG (2000 UT) tablet [id number] Take 1 tablet (2,000 Units total) by mouth daily. DTonia Ghent MD Taking Active   ?Dulaglutide (TRULICITY) 3 MLM/7.8MLSOPN [id number] Inject 3 mg as directed once a week. DTonia Ghent MD Taking Active   ?fluconazole (DIFLUCAN) 150 MG tablet [id number] Take one po qd for one dose, repeat again in three days if still with symptoms, repeat again in  three days if still with symptoms DEugenia Pancoast FNP Taking Active   ?glimepiride (AMARYL) 2 MG tablet [id number] TAKE 1 TABLET (2 MG TOTAL) BY MOUTH 2 (TWO) TIMES DAILY. DTonia Ghent MD Taking Active   ?l

## 2021-07-08 DIAGNOSIS — E785 Hyperlipidemia, unspecified: Secondary | ICD-10-CM | POA: Diagnosis not present

## 2021-07-08 DIAGNOSIS — E1159 Type 2 diabetes mellitus with other circulatory complications: Secondary | ICD-10-CM | POA: Diagnosis not present

## 2021-07-08 DIAGNOSIS — M8580 Other specified disorders of bone density and structure, unspecified site: Secondary | ICD-10-CM | POA: Diagnosis not present

## 2021-07-08 DIAGNOSIS — Z7984 Long term (current) use of oral hypoglycemic drugs: Secondary | ICD-10-CM | POA: Diagnosis not present

## 2021-07-08 DIAGNOSIS — I10 Essential (primary) hypertension: Secondary | ICD-10-CM

## 2021-07-08 NOTE — Patient Instructions (Signed)
Visit Information ? ?Phone number for Pharmacist: [phone] ? ? Goals Addressed   ? ?  ?  ?  ?  ? This Visit's Progress  ?  Monitor and Manage My Blood Sugar-Diabetes Type 2     ?  Timeframe:  Long-Range Goal ?Priority:  High ?Start Date:         07/03/21                    ?Expected End Date:  07/04/22                    ? ?Follow Up Date July 2023 ?  ?- check blood sugar at prescribed times ?- check blood sugar before and after exercise ?- check blood sugar if I feel it is too high or too low ?- take the blood sugar meter to all doctor visits  ?  ?Why is this important?   ?Checking your blood sugar at home helps to keep it from getting very high or very low.  ?Writing the results in a diary or log helps the doctor know how to care for you.  ?Your blood sugar log should have the time, date and the results.  ?Also, write down the amount of insulin or other medicine that you take.  ?Other information, like what you ate, exercise done and how you were feeling, will also be helpful.   ?  ?Notes:  ?  ? ?  ? ? ?Care Plan : General Pharmacy (Adult)  ?Updates made by Charlton Haws, [place] since 07/08/2021 12:00 AM  ?  ? ?Problem: Hypertension, Hyperlipidemia, Diabetes, and Osteopenia   ?Priority: High  ?  ? ?Long-Range Goal: Disease mgmt   ?Start Date: 04/02/2021  ?Expected End Date: 07/09/2022  ?This Visit's Progress: On track  ?Priority: High  ?Note:   ?Current Barriers:  ?Unable to achieve control of diabetes  ? ?Pharmacist Clinical Goal(s):  ?Patient will contact provider office for questions/concerns as evidenced notation of same in electronic health record through collaboration with PharmD and provider.  ? ?Interventions: ?1:1 collaboration with Tonia Ghent, MD regarding development and update of comprehensive plan of care as evidenced by provider attestation and co-signature ?Inter-disciplinary care team collaboration (see longitudinal plan of care) ?Comprehensive medication review performed; medication list  updated in electronic medical record ? ?Hypertension (BP goal <130/80) ?-Not ideally controlled - per clinic BP readings; she has taken losartan since 2012, tolerates well. ?-Current home readings: 117/80  ?-Current treatment: ?Losartan 50 mg daily - Appropriate, Effective, Safe, Accessible ?-Medications previously tried: lisinopril (cough) ?-Current exercise habits: Stopped going to gym in 2020 after COVID restrictions. She gets a free membership with insurance and she would like to get back into working out. She has 3 acres which keeps her active in the Spring/Summer. We discussed importance of daily exercise, starting with 10-15 minute increments for heart health. ?-Recommended to continue current medication ? ?Hyperlipidemia: (LDL goal < 70) ?-Not ideally controlled - LDL 84 (03/2021). LDL goal < 70 per ADA 2023 recommendations for patients with diabetes, age 44-75 at increased cardiovascular risk.  ?-Current treatment: ?Simvastatin 40 mg daily - Appropriate, Effective, Safe, Accessible ?-Medications previously tried: none - pt denies previous trials of statins or intolerances ?-Current dietary patterns: tries to follow low carb diet  ?-Current exercise habits: none formal ?-Educated on Cholesterol goals;  ?-Recommended to continue current medication; consider trial of high intensity statin ? ?Diabetes (A1c goal <7%) ?-Not ideally controlled - A1c 8.8 (  03/2021), pt reports a home health nurse checked it in Feb/March and it was 6.3%, she has transitioned from NP [facility] to Dr Ladell Pier at [place] [facility]. She did increase her metformin back to 2 tablets BID (2000 mg/day) and is tolerating well (previously had diarrhea, but also had COVID around that time as well) ?-Family history: both grandfathers lost legs due to DM ?-Current home glucose readings: not checking - pt is not sure what the numbers mean/what her goals are so she does not check ?-Denies hypoglycemic/hyperglycemic symptoms ?-Current  medications: ?Metformin 500 mg - 2 tab BID - Appropriate, Query Effective ?Trulicity 3 mg weekly (PAP) -Appropriate, Query Effective ?Glimepiride 2 mg BID -Appropriate, Query Effective ?Testing supplies ?-Medications previously tried: Jardiance (yeast infections, cost) ?-Current meal patterns: She limits potatoes and rice. She has reduced sugary drinks, drinks more water now ?-Current exercise: none formal ?-Educated on blood sugar goals: fasting < 130, 2-hr post-prandial < 180 ?-Recommended to continue current medication; repeat A1c at endocrine f/u in June ? ?Osteopenia (Goal: prevent fractures ) ?-Controlled ?-Last DEXA Scan: 03/07/21 ? T-Score femoral neck: -1.5  ? T-Score forearm radius: Normal 0.3 ? 10-year probability of major osteoporotic fracture: 15.1% ? 10-year probability of hip fracture: 1.8% ?-Patient is not a candidate for pharmacologic treatment ?-She fell a couple years ago on ice. No falls since then. She tries to stay very cautious. Denies any history of hip fractures. ?-Current treatment  ?Vitamin D 2000 IU daily  ?-Medications previously tried: none   ?-Recommend [phone] units of vitamin D daily. Recommend 1200 mg of calcium daily from dietary and supplemental sources. Discussed calcium in diet.  ?-Recommended to continue current medication ?  ?Patient Goals/Self-Care Activities ?Patient will:  ?- take medications as prescribed as evidenced by patient report and record review ?focus on medication adherence by routine ?check glucose daily (rotate timings), document, and provide at future appointments ?  ?  ? ?Patient verbalizes understanding of instructions and care plan provided today and agrees to view in [place]. Active MyChart status confirmed with patient.   ?Telephone follow up appointment with pharmacy team member scheduled for: 3 months ? ?Charlene Brooke, PharmD, BCACP ?Clinical Pharmacist ?[facility] at [facility] ?[phone] ?  ?

## 2021-09-28 ENCOUNTER — Telehealth: Payer: Self-pay

## 2021-09-28 NOTE — Progress Notes (Signed)
    Chronic Care Management Pharmacy Assistant   Name: Deborah Alvarado  MRN: [id number] DOB: 03/25/1950  Reason for Encounter: CCM (Appointment Reminder)  Medications: Outpatient Encounter Medications as of 09/28/2021  Medication Sig Note   B-D UF III MINI PEN NEEDLES 31G X 5 MM MISC INJECT 1 DEVICE AS DIRECTED DAILY. USE WITH INSULIN PEN    Cholecalciferol (VITAMIN D) 50 MCG (2000 UT) tablet Take 1 tablet (2,000 Units total) by mouth daily.    Dulaglutide (TRULICITY) 3 SV/7.7LT SOPN Inject 3 mg as directed once a week. 07/03/2021: Via [employer] PAP   fluconazole (DIFLUCAN) 150 MG tablet Take one po qd for one dose, repeat again in three days if still with symptoms, repeat again in three days if still with symptoms    glimepiride (AMARYL) 2 MG tablet TAKE 1 TABLET (2 MG TOTAL) BY MOUTH 2 (TWO) TIMES DAILY.    losartan (COZAAR) 50 MG tablet TAKE 1 TABLET BY MOUTH DAILY    metFORMIN (GLUCOPHAGE) 500 MG tablet Take 1 tablet (500 mg total) by mouth 2 (two) times daily with a meal. (Patient taking differently: Take 1,000 mg by mouth 2 (two) times daily with a meal.)    nystatin (MYCOSTATIN) 100000 UNIT/ML suspension Take 5 ml po every 6 hours for seven days, swish around and retain in mouth for up to 30 seconds, then spit out.    simvastatin (ZOCOR) 40 MG tablet Take 1 tablet (40 mg total) by mouth at bedtime.    No facility-administered encounter medications on file as of 09/28/2021.   [name] was contacted to remind of upcoming telephone visit with Charlene Brooke  on 10/03/2021 at 3:00. Patient was reminded to have any blood glucose and blood pressure readings available for review at appointment.   Message was left reminding patient of appointment.   CCM referral has been placed prior to visit?  Yes   Star Rating Drugs: Medication:  Last Fill: Day Supply Trulicity 3 mg  PAP Losartan 50 mg 08/23/2021 100 Metformin 500 mg 08/02/2021 100 Simvastatin 40 mg 08/23/2021 100 Fill  dates verified with [facility]  Charlene Brooke, CPP notified  Marijean Niemann, [place] Clinical Pharmacy Assistant [phone]

## 2021-10-03 ENCOUNTER — Ambulatory Visit (INDEPENDENT_AMBULATORY_CARE_PROVIDER_SITE_OTHER): Payer: Medicare Other | Admitting: Pharmacist

## 2021-10-03 DIAGNOSIS — E785 Hyperlipidemia, unspecified: Secondary | ICD-10-CM

## 2021-10-03 DIAGNOSIS — E1165 Type 2 diabetes mellitus with hyperglycemia: Secondary | ICD-10-CM

## 2021-10-03 DIAGNOSIS — I10 Essential (primary) hypertension: Secondary | ICD-10-CM

## 2021-10-03 DIAGNOSIS — M8580 Other specified disorders of bone density and structure, unspecified site: Secondary | ICD-10-CM

## 2021-10-03 NOTE — Patient Instructions (Signed)
Visit Information  Phone number for Pharmacist: [phone]   Goals Addressed   None     Care Plan : General Pharmacy (Adult)  Updates made by Charlton Haws, RPH since 10/03/2021 12:00 AM     Problem: Hypertension, Hyperlipidemia, Diabetes, and Osteopenia   Priority: High     Long-Range Goal: Disease mgmt   Start Date: 04/02/2021  Expected End Date: 07/09/2022  This Visit's Progress: On track  Recent Progress: On track  Priority: High  Note:   Current Barriers:  Unable to achieve control of diabetes   Pharmacist Clinical Goal(s):  Patient will contact provider office for questions/concerns as evidenced notation of same in electronic health record through collaboration with PharmD and provider.   Interventions: 1:1 collaboration with Tonia Ghent, MD regarding development and update of comprehensive plan of care as evidenced by provider attestation and co-signature Inter-disciplinary care team collaboration (see longitudinal plan of care) Comprehensive medication review performed; medication list updated in electronic medical record  Hypertension (BP goal <130/80) -Not ideally controlled - per clinic BP readings; she has taken losartan since 2012, tolerates well. -Current home readings: 117/80  -Current treatment: Losartan 50 mg daily - Appropriate, Effective, Safe, Accessible -Medications previously tried: lisinopril (cough) -Current exercise habits: Stopped going to gym in 2020 after COVID restrictions. She gets a free membership with insurance and she would like to get back into working out. She has 3 acres which keeps her active in the Spring/Summer. We discussed importance of daily exercise, starting with 10-15 minute increments for heart health. -Recommended to continue current medication  Hyperlipidemia: (LDL goal < 70) -Not ideally controlled - LDL 84 (03/2021). LDL goal < 70 per ADA 2023 recommendations for patients with diabetes, age 49-75 at increased  cardiovascular risk.  -Current treatment: Simvastatin 40 mg daily - Appropriate, Effective, Safe, Accessible -Medications previously tried: none - pt denies previous trials of statins or intolerances -Current dietary patterns: tries to follow low carb diet  -Current exercise habits: none formal -Educated on Cholesterol goals;  -Recommended to continue current medication; consider trial of high intensity statin  Diabetes (A1c goal <7%) -Query controlled -  A1c 8.8% (03/2021), pt reports a home health nurse checked it in Feb/March and it was 6.3%, she has transitioned from NP [facility] to Dr Ladell Pier at [place] [facility].  -Pt reports she recently stopped taking Trulicity due to very nonspecific symptoms - denies nausea/vomiting -Family history: both grandfathers lost legs due to DM -Current home glucose readings: not checking - seeing high BG causes more stress and she feels better not checking; she also doesn't know what to do about a high BG -Denies hypoglycemic/hyperglycemic symptoms -Current medications: Metformin 500 mg - 2 tab BID - Appropriate, Query Effective Trulicity 3 mg weekly (PAP) - not taking Glimepiride 4 mg BID -Appropriate, Query Effective Testing supplies -Medications previously tried: Jardiance (yeast infections, cost) -Current meal patterns: She limits potatoes and rice. She has reduced sugary drinks, drinks more water now; she has been eating a lot fruit (watermelon, cantoloupe) in the summer -Current exercise: none formal -Reviewed blood sugar goals: fasting < 130, 2-hr post-prandial < 180; discussed what to do about high BG - drink water, go for a walk -Reviewed foods high in sugar: carbs, fruit -Reviewed Trulicity side effects - her symptoms do not sound like they were due to Trulicity, advised she resume Trulicity -Recommended to continue current medication; check fasting BG a few times a week - f/u call in 1 month for readings  Osteopenia (Goal: prevent  fractures ) -Controlled -Last DEXA Scan: 03/07/21  T-Score femoral neck: -1.5   T-Score forearm radius: Normal 0.3  10-year probability of major osteoporotic fracture: 15.1%  10-year probability of hip fracture: 1.8% -Patient is not a candidate for pharmacologic treatment -She fell a couple years ago on ice. No falls since then. She tries to stay very cautious. Denies any history of hip fractures. -Current treatment  Vitamin D 2000 IU daily  -Medications previously tried: none   -Recommend [phone] units of vitamin D daily. Recommend 1200 mg of calcium daily from dietary and supplemental sources. Discussed calcium in diet.  -Recommended to continue current medication   Patient Goals/Self-Care Activities Patient will:  - take medications as prescribed as evidenced by patient report and record review focus on medication adherence by routine check glucose daily (rotate timings), document, and provide at future appointments      Patient verbalizes understanding of instructions and care plan provided today and agrees to view in [place]. Active MyChart status and patient understanding of how to access instructions and care plan via MyChart confirmed with patient.    Telephone follow up appointment with pharmacy team member scheduled for: 1 month  Charlene Brooke, PharmD, [facility] Clinical Pharmacist [place] [facility] [phone]

## 2021-10-03 NOTE — Progress Notes (Signed)
Chronic Care Management Pharmacy Note  10/03/2021 Name:  Deborah Alvarado MRN:  [id number] DOB:  June 29, 1950  Summary: CCM F/U visit -Discussed DM: A1c 8.8% (03/2021), however pt reports home health nurse checked it in Feb/March and it was 6.3%. She is not monitoring sugars at home - seeing a high BG causes more stress and she feels better not checking. Discussed BG goals and what to do about high BG - drink water, go for a walk -Pt is eating a lot of fruit lately - discussed fruit still has sugar and will increase BG -She stopped taking Trulicity recently due to nonspecific side effects, denies nausea/vomiting  Recommendations/Changes made from today's visit: -Advised to resume Trulicity -Recommended to check BG several times per week, alternate fasting and post-prandial  Plan: -Pharmacist follow up televisit scheduled for 1 months for BG update    Subjective: Deborah Alvarado is an 71 y.o. year old female who is a primary patient of [name], Elveria Rising, MD.  The CCM team was consulted for assistance with disease management and care coordination needs.    Engaged with patient by telephone for follow up visit in response to provider referral for pharmacy case management and/or care coordination services.   Consent to Services:  The patient was given information about Chronic Care Management services, agreed to services, and gave verbal consent prior to initiation of services.  Please see initial visit note for detailed documentation.   Patient Care Team: Tonia Ghent, MD as PCP - General Honor Junes Arvid Right, MD as Consulting Physician (Internal Medicine) Eulogio Bear, MD as Consulting Physician (Ophthalmology) Charlton Haws, [facility] as Pharmacist (Pharmacist)  Recent office visits: 04/26/21 NP Eugenia Pancoast OV: vaginal candida, strep. Rx'd Amoxicillin and Fluconazole.  04/13/21 NP Tabitha Dugal OV: oral thrush, sinusitis. Rx'd Amoxicillin, Nystatin rinse. Recent consult  visits: 05/17/21 Dr Ladell Pier (Endocrine): f/u DM. Pt reports A1c 6.3 per home health recently, unclear how accurate this is given A1c 8.8% in January and no changes since then. Advised to check sugar BID and call office in a week. F/U 3-4 months.  Hospital visits: None in previous 6 months   Objective:  Lab Results  Component Value Date   CREATININE 0.81 03/20/2021   BUN 15 03/20/2021   GFR 74.47 03/20/2021   GFRNONAA 68.49 01/02/2010   GFRAA 112 07/16/2007   NA 136 03/20/2021   K 4.7 03/20/2021   CALCIUM 10.0 03/20/2021   CO2 26 03/20/2021   GLUCOSE 188 (H) 03/20/2021    Lab Results  Component Value Date/Time   HGBA1C 8.8 (H) 03/20/2021 11:44 AM   HGBA1C 9.8 11/11/2019 12:00 AM   HGBA1C 10.6 (H) 12/29/2017 11:52 AM   GFR 74.47 03/20/2021 11:44 AM   GFR 72.23 12/29/2017 11:52 AM   MICROALBUR 1.0 10/05/2008 09:54 AM   MICROALBUR 1.7 07/16/2007 09:40 AM    Last diabetic Eye exam:  Lab Results  Component Value Date/Time   HMDIABEYEEXA No Retinopathy 04/19/2021 12:00 AM    Last diabetic Foot exam:  Lab Results  Component Value Date/Time   HMDIABFOOTEX yes 12/15/2009 12:00 AM     Lab Results  Component Value Date   CHOL 150 03/20/2021   HDL 39.00 (L) 03/20/2021   LDLCALC 97 02/06/2017   LDLDIRECT 84.0 03/20/2021   TRIG 277.0 (H) 03/20/2021   CHOLHDL 4 03/20/2021       Latest Ref Rng & Units 03/20/2021   11:44 AM 12/29/2017   11:52 AM 02/06/2017    8:51  AM  Hepatic Function  Total Protein 6.0 - 8.3 g/dL 7.0  7.2  6.9   Albumin 3.5 - 5.2 g/dL 4.5  4.5  4.3   AST 0 - 37 U/L 28  21  35   ALT 0 - 35 U/L 32  30  43   Alk Phosphatase 39 - 117 U/L 78  96  86   Total Bilirubin 0.2 - 1.2 mg/dL 0.5  0.6  0.6     Lab Results  Component Value Date/Time   TSH 2.91 10/05/2008 09:54 AM   TSH 2.10 07/16/2007 09:40 AM       Latest Ref Rng & Units 10/05/2008    9:54 AM  CBC  WBC 4.5 - 10.5 10*3/microliter 5.2   Hemoglobin 12.0 - 15.0 g/dL 13.8   Hematocrit 36.0 -  46.0 % 40.6   Platelets 150.0 - 400.0 K/uL 220.0     Lab Results  Component Value Date/Time   VD25OH 45.56 03/20/2021 11:44 AM   VD25OH 39.94 04/17/2018 11:12 AM    Clinical ASCVD: No  The 10-year ASCVD risk score (Arnett DK, et al., 2019) is: 21.6%   Values used to calculate the score:     Age: 9 years     Sex: Female     Is Non-Hispanic African American: No     Diabetic: Yes     Tobacco smoker: No     Systolic Blood Pressure: 831 mmHg     Is BP treated: Yes     HDL Cholesterol: 39 mg/dL     Total Cholesterol: 150 mg/dL       01/23/2021    1:28 PM 02/28/2020    2:31 PM 12/29/2017   11:05 AM  Depression screen PHQ 2/9  Decreased Interest 0 0 0  Down, Depressed, Hopeless 0 0 0  PHQ - 2 Score 0 0 0     Social History   Tobacco Use  Smoking Status Never  Smokeless Tobacco Never   BP Readings from Last 3 Encounters:  04/26/21 (!) 148/74  04/13/21 (!) 164/112  03/20/21 124/82   Pulse Readings from Last 3 Encounters:  04/26/21 88  04/13/21 (!) 119  03/20/21 88   Wt Readings from Last 3 Encounters:  04/26/21 160 lb (72.6 kg)  04/13/21 161 lb (73 kg)  03/20/21 161 lb (73 kg)   BMI Readings from Last 3 Encounters:  04/26/21 27.46 kg/m  04/13/21 27.64 kg/m  03/26/21 27.64 kg/m    Assessment/Interventions: Review of patient past medical history, allergies, medications, health status, including review of consultants reports, laboratory and other test data, was performed as part of comprehensive evaluation and provision of chronic care management services.   SDOH:  (Social Determinants of Health) assessments and interventions performed: No - done Dec 2022-Feb 2023  SDOH Screenings   Alcohol Screen: Low Risk  (01/23/2021)   Alcohol Screen    Last Alcohol Screening Score (AUDIT): 0  Depression (PHQ2-9): Low Risk  (01/23/2021)   Depression (PHQ2-9)    PHQ-2 Score: 0  Financial Resource Strain: Medium Risk (04/02/2021)   Overall Financial Resource Strain  (CARDIA)    Difficulty of Paying Living Expenses: Somewhat hard  Food Insecurity: No Food Insecurity (01/23/2021)   Hunger Vital Sign    Worried About Running Out of Food in the Last Year: Never true    Ran Out of Food in the Last Year: Never true  Housing: Low Risk  (04/02/2021)   Housing    Last Housing Risk Score:  0  Physical Activity: Inactive (01/23/2021)   Exercise Vital Sign    Days of Exercise per Week: 0 days    Minutes of Exercise per Session: 0 min  Social Connections: Moderately Isolated (01/23/2021)   Social Connection and Isolation Panel [NHANES]    Frequency of Communication with Friends and Family: More than three times a week    Frequency of Social Gatherings with Friends and Family: Twice a week    Attends Religious Services: Never    Marine scientist or Organizations: No    Attends Archivist Meetings: Never    Marital Status: Married  Stress: Not on file  Tobacco Use: Low Risk  (04/26/2021)   Patient History    Smoking Tobacco Use: Never    Smokeless Tobacco Use: Never    Passive Exposure: Not on file  Transportation Needs: No Transportation Needs (01/23/2021)   PRAPARE - Hydrologist (Medical): No    Lack of Transportation (Non-Medical): No    CCM Care Plan  Allergies  Allergen Reactions   Lisinopril     Cough, presumed   Metformin And Related Other (See Comments)    Diarrhea at >1023m a day    Medications Reviewed Today     Reviewed by [facility]Charlton Haws [facility](Pharmacist) on 10/03/21 at 1521  Med List Status: <None>   Medication Order Taking? Sig Documenting Provider Last Dose Status Informant  B-D UF III MINI PEN NEEDLES 31G X 5 MM MISC [id number] INJECT 1 DEVICE AS DIRECTED DAILY. USE WITH INSULIN PEN DTonia Ghent MD Taking Active   Cholecalciferol (VITAMIN D) 50 MCG (2000 UT) tablet [id number] Take 1 tablet (2,000 Units total) by mouth daily. DTonia Ghent MD Taking Active    Dulaglutide (TRULICITY) 3 MNO/0.3BCSBonney [id number] Inject 3 mg as directed once a week.  Patient not taking: Reported on 10/03/2021   DTonia Ghent MD Not Taking Active            Med Note (Charlton Haws  Tue Jul 03, 2021  9:20 AM) Via [facility] PAP  glimepiride (AMARYL) 4 MG tablet [id number] Take 4 mg by mouth daily with breakfast. OLonia Farber MD Taking Active   losartan (COZAAR) 50 MG tablet [id number] TAKE 1 TABLET BY MOUTH DAILY DTonia Ghent MD Taking Active   metFORMIN (GLUCOPHAGE) 500 MG tablet [id number] Take 1,000 mg by mouth 2 (two) times daily with a meal. OLonia Farber MD  Active   nystatin (MYCOSTATIN) 100000 UNIT/ML suspension [id number] Take 5 ml po every 6 hours for seven days, swish around and retain in mouth for up to 30 seconds, then spit out. DEugenia Pancoast FNP Taking Active   simvastatin (ZOCOR) 40 MG tablet [id number] Take 1 tablet (40 mg total) by mouth at bedtime. DTonia Ghent MD Taking Active             Patient Active Problem List   Diagnosis Date Noted   Vaginal candida 04/26/2021   Strep pharyngitis 04/13/2021   Sore throat 04/13/2021   Oral thrush 04/13/2021   Dysuria 03/21/2021   Tremor of left hand 03/21/2021   Knee pain 03/21/2021   Healthcare maintenance 03/01/2020   Osteopenia 04/15/2018   Vaginal irritation 12/31/2017   Sinus tachycardia 12/31/2017   Cervical radiculitis 09/23/2017   Advance care planning 02/07/2017   Welcome to Medicare preventive visit 03/29/2011   Transaminitis 03/29/2011  Hyperlipidemia 07/13/2010   PLANTAR FASCIITIS, LEFT 01/20/2008   Diabetes mellitus without complication ([place]) 00/37/0488   ANXIETY 07/31/2006   Essential hypertension 07/31/2006   ALLERGIC RHINITIS 07/31/2006   ACNE ROSACEA 04/11/2005    Immunization History  Administered Date(s) Administered   Hep A / Hep B 07/31/2012, 08/31/2012, 01/21/2013   Influenza Whole 12/23/2008, 12/15/2009    Influenza, High Dose Seasonal PF 01/03/2019   Influenza,inj,Quad PF,6+ Mos 11/25/2012, 01/08/2015, 12/29/2017   Influenza-Unspecified 12/06/2013, 12/25/2015, 12/23/2016, 01/10/2020, 11/23/2020   PFIZER Comirnaty(Gray Top)Covid-19 Tri-Sucrose Vaccine 01/17/2020   PFIZER(Purple Top)SARS-COV-2 Vaccination 04/12/2019, 05/09/2019, 01/17/2020   Pfizer Covid-19 Vaccine Bivalent Booster 74yr & up 01/21/2021   Pneumococcal Conjugate-13 12/29/2017, 01/05/2019   Pneumococcal Polysaccharide-23 12/25/2010   Td 04/11/2005   Tdap 02/06/2017   Zoster Recombinat (Shingrix) 01/28/2019, 04/05/2019   Zoster, Live 07/31/2012    Conditions to be addressed/monitored:  Hypertension, Hyperlipidemia, Diabetes, and Osteopenia  Care Plan : General Pharmacy (Adult)  Updates made by FCharlton Haws RTregosince 10/03/2021 12:00 AM     Problem: Hypertension, Hyperlipidemia, Diabetes, and Osteopenia   Priority: High     Long-Range Goal: Disease mgmt   Start Date: 04/02/2021  Expected End Date: 07/09/2022  This Visit's Progress: On track  Recent Progress: On track  Priority: High  Note:   Current Barriers:  Unable to achieve control of diabetes   Pharmacist Clinical Goal(s):  Patient will contact provider office for questions/concerns as evidenced notation of same in electronic health record through collaboration with PharmD and provider.   Interventions: 1:1 collaboration with DTonia Ghent MD regarding development and update of comprehensive plan of care as evidenced by provider attestation and co-signature Inter-disciplinary care team collaboration (see longitudinal plan of care) Comprehensive medication review performed; medication list updated in electronic medical record  Hypertension (BP goal <130/80) -Not ideally controlled - per clinic BP readings; she has taken losartan since 2012, tolerates well. -Current home readings: 117/80  -Current treatment: Losartan 50 mg daily - Appropriate,  Effective, Safe, Accessible -Medications previously tried: lisinopril (cough) -Current exercise habits: Stopped going to gym in 2020 after COVID restrictions. She gets a free membership with insurance and she would like to get back into working out. She has 3 acres which keeps her active in the Spring/Summer. We discussed importance of daily exercise, starting with 10-15 minute increments for heart health. -Recommended to continue current medication  Hyperlipidemia: (LDL goal < 70) -Not ideally controlled - LDL 84 (03/2021). LDL goal < 70 per ADA 2023 recommendations for patients with diabetes, age 69-75at increased cardiovascular risk.  -Current treatment: Simvastatin 40 mg daily - Appropriate, Effective, Safe, Accessible -Medications previously tried: none - pt denies previous trials of statins or intolerances -Current dietary patterns: tries to follow low carb diet  -Current exercise habits: none formal -Educated on Cholesterol goals;  -Recommended to continue current medication; consider trial of high intensity statin  Diabetes (A1c goal <7%) -Query controlled -  A1c 8.8% (03/2021), pt reports a home health nurse checked it in Feb/March and it was 6.3%, [facility]  -Pt reports she recently stopped taking Trulicity due to very nonspecific symptoms - denies nausea/vomiting -Family history: both grandfathers lost legs due to DM -Current home glucose readings: not checking - seeing high BG causes more stress and she feels better not checking; she also doesn't know what to do about a high BG -Denies hypoglycemic/hyperglycemic symptoms -Current medications: Metformin 500 mg -  2 tab BID - Appropriate, Query Effective Trulicity 3 mg weekly (PAP) - not taking Glimepiride 4 mg BID -Appropriate, Query Effective Testing supplies -Medications previously tried: Jardiance (yeast infections, cost) -Current meal patterns: She limits  potatoes and rice. She has reduced sugary drinks, drinks more water now; she has been eating a lot fruit (watermelon, cantoloupe) in the summer -Current exercise: none formal -Reviewed blood sugar goals: fasting < 130, 2-hr post-prandial < 180; discussed what to do about high BG - drink water, go for a walk -Reviewed foods high in sugar: carbs, fruit -Reviewed Trulicity side effects - her symptoms do not sound like they were due to Trulicity, advised she resume Trulicity -Recommended to continue current medication; check fasting BG a few times a week - f/u call in 1 month for readings  Osteopenia (Goal: prevent fractures ) -Controlled -Last DEXA Scan: 03/07/21  T-Score femoral neck: -1.5   T-Score forearm radius: Normal 0.3  10-year probability of major osteoporotic fracture: 15.1%  10-year probability of hip fracture: 1.8% -Patient is not a candidate for pharmacologic treatment -She fell a couple years ago on ice. No falls since then. She tries to stay very cautious. Denies any history of hip fractures. -Current treatment  Vitamin D 2000 IU daily  -Medications previously tried: none   -Recommend [phone] units of vitamin D daily. Recommend 1200 mg of calcium daily from dietary and supplemental sources. Discussed calcium in diet.  -Recommended to continue current medication   Patient Goals/Self-Care Activities Patient will:  - take medications as prescribed as evidenced by patient report and record review focus on medication adherence by routine check glucose daily (rotate timings), document, and provide at future appointments       Medication Assistance:  [place] approved through 03/10/22  Compliance/Adherence/Medication fill history: Care Gaps: NONE  Star-Rating Drugs: Glimepiride 2 mg - PDC 91% Losartan - PDC 94% Metformin - PDC 76%; LF 05/11/21 x 90 ds Simvastatin - PDC 52% Trulicity - PAP  Medication Access: Within the past 30 days, how often has  patient missed a dose of medication? Stopped Trulicity Is a pillbox or other method used to improve adherence? Yes  Factors that may affect medication adherence? adverse effects of medications and lack of understanding of disease management Are meds synced by current pharmacy? No  Are meds delivered by current pharmacy? Yes  Does patient experience delays in picking up medications due to transportation concerns? No   Upstream Services Reviewed: Is patient disadvantaged to use [facility]?: Yes  Current Rx insurance plan: [facility] Name and location of Current pharmacy:  [id number] [place]: [phone]: [phone] [facility] [place] [address] [address]Phone: [phone]: [phone] [facility] services reviewed with patient today?: No  Patient requests to transfer care to [facility]?: No  Reason patient declined to change pharmacies: Disadvantaged due to insurance/mail order   Care Plan and Follow Up Patient Decision:  Patient agrees to Care Plan and Follow-up.  Plan: Telephone follow up appointment with care management team member scheduled for:  3 months  LCharlene Brooke PharmD, BCACP Clinical Pharmacist [phone]

## 2021-10-08 DIAGNOSIS — I10 Essential (primary) hypertension: Secondary | ICD-10-CM

## 2021-10-08 DIAGNOSIS — E785 Hyperlipidemia, unspecified: Secondary | ICD-10-CM

## 2021-10-08 DIAGNOSIS — E1165 Type 2 diabetes mellitus with hyperglycemia: Secondary | ICD-10-CM | POA: Diagnosis not present

## 2021-10-31 ENCOUNTER — Telehealth: Payer: Self-pay

## 2021-10-31 NOTE — Progress Notes (Signed)
    Chronic Care Management Pharmacy Assistant   Name: Deborah Alvarado  MRN: [id number] DOB: Apr 15, 1950  Reason for Encounter: CCM (Appointment Reminder)  Medications: Outpatient Encounter Medications as of 10/31/2021  Medication Sig Note   B-D UF III MINI PEN NEEDLES 31G X 5 MM MISC INJECT 1 DEVICE AS DIRECTED DAILY. USE WITH INSULIN PEN    Cholecalciferol (VITAMIN D) 50 MCG (2000 UT) tablet Take 1 tablet (2,000 Units total) by mouth daily.    Dulaglutide (TRULICITY) 3 UU/8.2CM SOPN Inject 3 mg as directed once a week. (Patient not taking: Reported on 10/03/2021) 07/03/2021: Via [employer] [facility] PAP   glimepiride (AMARYL) 4 MG tablet Take 4 mg by mouth daily with breakfast.    losartan (COZAAR) 50 MG tablet TAKE 1 TABLET BY MOUTH DAILY    metFORMIN (GLUCOPHAGE) 500 MG tablet Take 1,000 mg by mouth 2 (two) times daily with a meal.    nystatin (MYCOSTATIN) 100000 UNIT/ML suspension Take 5 ml po every 6 hours for seven days, swish around and retain in mouth for up to 30 seconds, then spit out.    simvastatin (ZOCOR) 40 MG tablet Take 1 tablet (40 mg total) by mouth at bedtime.    No facility-administered encounter medications on file as of 10/31/2021.   [name] was contacted to remind of upcoming telephone visit with Charlene Brooke on 11/05/2021 at 11:00. Patient was reminded to have any blood glucose and blood pressure readings available for review at appointment.   Message was left reminding patient of appointment.  CCM referral has been placed prior to visit?  Yes   Star Rating Drugs: Medication:  Last Fill: Day Supply Trulicity 3 mg  PAP Losartan 50 mg 08/23/21 100 Metformin 500 mg 08/01/21 100 Simvastatin 40 mg 08/23/21 [place], CPP notified  Marijean Niemann, [place] Clinical Pharmacy Assistant [phone]

## 2021-11-05 ENCOUNTER — Ambulatory Visit (INDEPENDENT_AMBULATORY_CARE_PROVIDER_SITE_OTHER): Payer: Medicare Other | Admitting: Pharmacist

## 2021-11-05 DIAGNOSIS — M8580 Other specified disorders of bone density and structure, unspecified site: Secondary | ICD-10-CM

## 2021-11-05 DIAGNOSIS — E1165 Type 2 diabetes mellitus with hyperglycemia: Secondary | ICD-10-CM

## 2021-11-05 DIAGNOSIS — E785 Hyperlipidemia, unspecified: Secondary | ICD-10-CM

## 2021-11-05 DIAGNOSIS — I10 Essential (primary) hypertension: Secondary | ICD-10-CM

## 2021-11-05 NOTE — Patient Instructions (Signed)
Visit Information  Phone number for Pharmacist: [phone]   Goals Addressed   None     Care Plan : General Pharmacy (Adult)  Updates made by Charlton Haws, RPH since 11/05/2021 12:00 AM     Problem: Hypertension, Hyperlipidemia, Diabetes, and Osteopenia   Priority: High     Long-Range Goal: Disease mgmt   Start Date: 04/02/2021  Expected End Date: 07/09/2022  This Visit's Progress: On track  Recent Progress: On track  Priority: High  Note:   Current Barriers:  Unable to achieve control of diabetes   Pharmacist Clinical Goal(s):  Patient will contact provider office for questions/concerns as evidenced notation of same in electronic health record through collaboration with PharmD and provider.   Interventions: 1:1 collaboration with Tonia Ghent, MD regarding development and update of comprehensive plan of care as evidenced by provider attestation and co-signature Inter-disciplinary care team collaboration (see longitudinal plan of care) Comprehensive medication review performed; medication list updated in electronic medical record  Hypertension (BP goal <130/80) -Controlled - per home BP readings; she has taken losartan since 2012, tolerates well. -Current home readings: 117/80  -Current treatment: Losartan 50 mg daily - Appropriate, Effective, Safe, Accessible -Medications previously tried: lisinopril (cough) -Current exercise habits: Stopped going to gym in 2020 after COVID restrictions. She gets a free membership with insurance and she would like to get back into working out. She has 3 acres which keeps her active in the Spring/Summer. We discussed importance of daily exercise, starting with 10-15 minute increments for heart health. -Recommended to continue current medication  Hyperlipidemia: (LDL goal < 70) -Not ideally controlled - LDL 84 (03/2021). LDL goal < 70 per ADA 2023 recommendations for patients with diabetes, age 69-75 at increased cardiovascular  risk.  -ASCVD risk 21% (high) -Current treatment: Simvastatin 40 mg daily - Appropriate, Query Effective -Medications previously tried: none - pt denies previous trials of statins or intolerances -Current dietary patterns: tries to follow low carb diet  -Current exercise habits: none formal -Educated on Cholesterol goals;  -Recommended to continue current medication; consider trial of high intensity statin  Diabetes (A1c goal <7%) -Query controlled -  A1c 8.8% (03/2021), pt reports a home health nurse checked it in Feb/March and it was 6.3%, she has transitioned [facility] to [facility] Ladell [facility] at [facility] but is not following with them right now due to scheduling conflicts, she wants PCP to check A1c soon -Family history: both grandfathers lost legs due to DM -Current home glucose readings: not checking -Denies hypoglycemic/hyperglycemic symptoms -Current medications: Metformin 500 mg - 2 tab BID - Appropriate, Query Effective Trulicity 3 mg weekly (PAP) - Appropriate, Query Effective Glimepiride 4 mg BID -Appropriate, Query Effective Testing supplies -Medications previously tried: Jardiance (yeast infections, cost) -Current meal patterns: She limits potatoes and rice. She has reduced sugary drinks, drinks more water now; she has been eating a lot fruit (watermelon, cantoloupe) in the summer -Current exercise: none formal -Reviewed blood sugar goals: fasting < 130, 2-hr post-prandial < 180; discussed what to do about high BG - drink water, go for a walk -Reviewed foods high in sugar: carbs, fruit -Recommended to continue current medication; scheduled PCP visit for A1c  Osteopenia (Goal: prevent fractures ) -Controlled -Last DEXA Scan: 03/07/21  T-Score femoral neck: -1.5   T-Score forearm radius: Normal 0.3  10-year probability of major osteoporotic fracture: 15.1%  10-year probability of hip fracture: 1.8% -Patient is not a candidate for pharmacologic treatment -She  fell a couple  years ago on ice. No falls since then. She tries to stay very cautious. Denies any history of hip fractures. -Current treatment  Vitamin D 2000 IU daily - Appropriate, Effective, Safe, Accessible -Medications previously tried: none   -Recommend [phone] units of vitamin D daily. Recommend 1200 mg of calcium daily from dietary and supplemental sources. Discussed calcium in diet.  -Recommended to continue current medication   Patient Goals/Self-Care Activities Patient will:  - take medications as prescribed as evidenced by patient report and record review focus on medication adherence by routine check glucose daily (rotate timings), document, and provide at future appointments      Patient verbalizes understanding of instructions and care plan provided today and agrees to view in [place]. Active MyChart status and patient understanding of how to access instructions and care plan via MyChart confirmed with patient.    Telephone follow up appointment with pharmacy team member scheduled for: 4 months  Charlene Brooke, PharmD, [facility] Clinical Pharmacist [place] [facility] at [facility] [phone]

## 2021-11-05 NOTE — Progress Notes (Signed)
Chronic Care Management Pharmacy Note  11/05/2021 Name:  Deborah Alvarado MRN:  [id number] DOB:  1950/07/07  Summary: CCM F/U visit -Discussed DM: A1c 8.8% (03/2021), however pt reports home health nurse checked it in Feb/March and it was 6.3%. She is still not monitoring sugars at home even after discussion last time - she would like to come see PCP for A1c check -Pt reports ongoing/chronic issues with shaky hands and neck pain, she wonders if these are side effects; reviewed her regimen and her medications are unlikely to be related - recommend evaluation by PCP  Recommendations/Changes made from today's visit: -Scheduled PCP visit 9/8  Plan: -Pharmacist follow up televisit scheduled for 4 months -PCP 11/16/21    Subjective: Deborah Alvarado is an 71 y.o. year old female who is a primary patient of [name], Elveria Rising, MD.  The CCM team was consulted for assistance with disease management and care coordination needs.    Engaged with patient by telephone for follow up visit in response to provider referral for pharmacy case management and/or care coordination services.   Consent to Services:  The patient was given information about Chronic Care Management services, agreed to services, and gave verbal consent prior to initiation of services.  Please see initial visit note for detailed documentation.   Patient Care Team: Tonia Ghent, MD as PCP - General Honor Junes Arvid Right, MD as Consulting Physician (Internal Medicine) Eulogio Bear, MD as Consulting Physician (Ophthalmology) Charlton Haws, [facility] as Pharmacist (Pharmacist)  Recent office visits: 04/26/21 NP Eugenia Pancoast OV: vaginal candida, strep. Rx'd Amoxicillin and Fluconazole.  04/13/21 NP Tabitha Dugal OV: oral thrush, sinusitis. Rx'd Amoxicillin, Nystatin rinse.  03/20/21 Dr [name] OV: Annual - reduced metformin to 500 mg  Recent consult visits: 05/17/21 Dr Ladell Pier (Endocrine): f/u DM. Pt reports A1c 6.3 per home  health recently, unclear how accurate this is given A1c 8.8% in January and no changes since then. Advised to check sugar BID and call office in a week. F/U 3-4 months.  Hospital visits: None in previous 6 months   Objective:  Lab Results  Component Value Date   CREATININE 0.81 03/20/2021   BUN 15 03/20/2021   GFR 74.47 03/20/2021   GFRNONAA 68.49 01/02/2010   GFRAA 112 07/16/2007   NA 136 03/20/2021   K 4.7 03/20/2021   CALCIUM 10.0 03/20/2021   CO2 26 03/20/2021   GLUCOSE 188 (H) 03/20/2021    Lab Results  Component Value Date/Time   HGBA1C 8.8 (H) 03/20/2021 11:44 AM   HGBA1C 9.8 11/11/2019 12:00 AM   HGBA1C 10.6 (H) 12/29/2017 11:52 AM   GFR 74.47 03/20/2021 11:44 AM   GFR 72.23 12/29/2017 11:52 AM   MICROALBUR 1.0 10/05/2008 09:54 AM   MICROALBUR 1.7 07/16/2007 09:40 AM    Last diabetic Eye exam:  Lab Results  Component Value Date/Time   HMDIABEYEEXA No Retinopathy 04/19/2021 12:00 AM    Last diabetic Foot exam:  Lab Results  Component Value Date/Time   HMDIABFOOTEX yes 12/15/2009 12:00 AM     Lab Results  Component Value Date   CHOL 150 03/20/2021   HDL 39.00 (L) 03/20/2021   LDLCALC 97 02/06/2017   LDLDIRECT 84.0 03/20/2021   TRIG 277.0 (H) 03/20/2021   CHOLHDL 4 03/20/2021       Latest Ref Rng & Units 03/20/2021   11:44 AM 12/29/2017   11:52 AM 02/06/2017    8:51 AM  Hepatic Function  Total Protein 6.0 - 8.3 g/dL  7.0  7.2  6.9   Albumin 3.5 - 5.2 g/dL 4.5  4.5  4.3   AST 0 - 37 U/L 28  21  35   ALT 0 - 35 U/L 32  30  43   Alk Phosphatase 39 - 117 U/L 78  96  86   Total Bilirubin 0.2 - 1.2 mg/dL 0.5  0.6  0.6     Lab Results  Component Value Date/Time   TSH 2.91 10/05/2008 09:54 AM   TSH 2.10 07/16/2007 09:40 AM       Latest Ref Rng & Units 10/05/2008    9:54 AM  CBC  WBC 4.5 - 10.5 10*3/microliter 5.2   Hemoglobin 12.0 - 15.0 g/dL 13.8   Hematocrit 36.0 - 46.0 % 40.6   Platelets 150.0 - 400.0 K/uL 220.0     Lab Results   Component Value Date/Time   VD25OH 45.56 03/20/2021 11:44 AM   VD25OH 39.94 04/17/2018 11:12 AM    Clinical ASCVD: No  The 10-year ASCVD risk score (Arnett DK, et al., 2019) is: 21.6%   Values used to calculate the score:     Age: 70 years     Sex: Female     Is Non-Hispanic African American: No     Diabetic: Yes     Tobacco smoker: No     Systolic Blood Pressure: 559 mmHg     Is BP treated: Yes     HDL Cholesterol: 39 mg/dL     Total Cholesterol: 150 mg/dL       01/23/2021    1:28 PM 02/28/2020    2:31 PM 12/29/2017   11:05 AM  Depression screen PHQ 2/9  Decreased Interest 0 0 0  Down, Depressed, Hopeless 0 0 0  PHQ - 2 Score 0 0 0     Social History   Tobacco Use  Smoking Status Never  Smokeless Tobacco Never   BP Readings from Last 3 Encounters:  04/26/21 (!) 148/74  04/13/21 (!) 164/112  03/20/21 124/82   Pulse Readings from Last 3 Encounters:  04/26/21 88  04/13/21 (!) 119  03/20/21 88   Wt Readings from Last 3 Encounters:  04/26/21 160 lb (72.6 kg)  04/13/21 161 lb (73 kg)  03/20/21 161 lb (73 kg)   BMI Readings from Last 3 Encounters:  04/26/21 27.46 kg/m  04/13/21 27.64 kg/m  03/26/21 27.64 kg/m    Assessment/Interventions: Review of patient past medical history, allergies, medications, health status, including review of consultants reports, laboratory and other test data, was performed as part of comprehensive evaluation and provision of chronic care management services.   SDOH:  (Social Determinants of Health) assessments and interventions performed: No - done Dec 2022-Feb 2023  SDOH Screenings   Alcohol Screen: Low Risk  (01/23/2021)   Alcohol Screen    Last Alcohol Screening Score (AUDIT): 0  Depression (PHQ2-9): Low Risk  (01/23/2021)   Depression (PHQ2-9)    PHQ-2 Score: 0  Financial Resource Strain: Medium Risk (04/02/2021)   Overall Financial Resource Strain (CARDIA)    Difficulty of Paying Living Expenses: Somewhat hard  Food  Insecurity: No Food Insecurity (01/23/2021)   Hunger Vital Sign    Worried About Running Out of Food in the Last Year: Never true    Ran Out of Food in the Last Year: Never true  Housing: Low Risk  (04/02/2021)   Housing    Last Housing Risk Score: 0  Physical Activity: Inactive (01/23/2021)   Exercise Vital Sign  Days of Exercise per Week: 0 days    Minutes of Exercise per Session: 0 min  Social Connections: Moderately Isolated (01/23/2021)   Social Connection and Isolation Panel [NHANES]    Frequency of Communication with Friends and Family: More than three times a week    Frequency of Social Gatherings with Friends and Family: Twice a week    Attends Religious Services: Never    Marine scientist or Organizations: No    Attends Archivist Meetings: Never    Marital Status: Married  Stress: Not on file  Tobacco Use: Low Risk  (04/26/2021)   Patient History    Smoking Tobacco Use: Never    Smokeless Tobacco Use: Never    Passive Exposure: Not on file  Transportation Needs: No Transportation Needs (01/23/2021)   PRAPARE - Hydrologist (Medical): No    Lack of Transportation (Non-Medical): No    CCM Care Plan  Allergies  Allergen Reactions   Lisinopril     Cough, presumed   Metformin And Related Other (See Comments)    Diarrhea at >1050m a day    Medications Reviewed Today     Reviewed by FCharlton Haws [facility] LLP(Pharmacist) on 11/05/21 at 1123  Med List Status: <None>   Medication Order Taking? Sig Documenting Provider Last Dose Status Informant  B-D UF III MINI PEN NEEDLES 31G X 5 MM MISC [id number] INJECT 1 DEVICE AS DIRECTED DAILY. USE WITH INSULIN PEN DTonia Ghent MD Taking Active   Cholecalciferol (VITAMIN D) 50 MCG (2000 UT) tablet [id number] Take 1 tablet (2,000 Units total) by mouth daily. DTonia Ghent MD Taking Active   Dulaglutide (TRULICITY) 3 MYF/7.4BSSBonney [id number] Inject 3 mg as directed  once a week. DTonia Ghent MD Taking Active            Med Note (Lenord Fellers LCleaster Corin  Tue Jul 03, 2021  9:20 AM) Via LRalph LeydenCares PAP  glimepiride (AMARYL) 4 MG tablet [id number] Take 4 mg by mouth daily with breakfast. OLonia Farber MD Taking Active   losartan (COZAAR) 50 MG tablet [id number] TAKE 1 TABLET BY MOUTH DAILY DTonia Ghent MD Taking Active   metFORMIN (GLUCOPHAGE) 500 MG tablet [id number] Take 1,000 mg by mouth 2 (two) times daily with a meal. OLonia Farber MD Taking Active   nystatin (MYCOSTATIN) 100000 UNIT/ML suspension [id number] Take 5 ml po every 6 hours for seven days, swish around and retain in mouth for up to 30 seconds, then spit out. DEugenia Pancoast FNP Taking Active   simvastatin (ZOCOR) 40 MG tablet [id number] Take 1 tablet (40 mg total) by mouth at bedtime. DTonia Ghent MD Taking Active             Patient Active Problem List   Diagnosis Date Noted   Vaginal candida 04/26/2021   Strep pharyngitis 04/13/2021   Sore throat 04/13/2021   Oral thrush 04/13/2021   Dysuria 03/21/2021   Tremor of left hand 03/21/2021   Knee pain 03/21/2021   Healthcare maintenance 03/01/2020   Osteopenia 04/15/2018   Vaginal irritation 12/31/2017   Sinus tachycardia 12/31/2017   Cervical radiculitis 09/23/2017   Advance care planning 02/07/2017   Welcome to Medicare preventive visit 03/29/2011   Transaminitis 03/29/2011   Hyperlipidemia 07/13/2010   PLANTAR FASCIITIS, LEFT 01/20/2008   Diabetes mellitus without complication (HIsanti 037/34/2876  ANXIETY 07/31/2006   Essential  hypertension 07/31/2006   ALLERGIC RHINITIS 07/31/2006   ACNE ROSACEA 04/11/2005    Immunization History  Administered Date(s) Administered   Hep A / Hep B 07/31/2012, 08/31/2012, 01/21/2013   Influenza Whole 12/23/2008, 12/15/2009   Influenza, High Dose Seasonal PF 01/03/2019   Influenza,inj,Quad PF,6+ Mos 11/25/2012, 01/08/2015, 12/29/2017    Influenza-Unspecified 12/06/2013, 12/25/2015, 12/23/2016, 01/10/2020, 11/23/2020   PFIZER Comirnaty(Gray Top)Covid-19 Tri-Sucrose Vaccine 01/17/2020   PFIZER(Purple Top)SARS-COV-2 Vaccination 04/12/2019, 05/09/2019, 01/17/2020   Pfizer Covid-19 Vaccine Bivalent Booster 52yr & up 01/21/2021   Pneumococcal Conjugate-13 12/29/2017, 01/05/2019   Pneumococcal Polysaccharide-23 12/25/2010   Td 04/11/2005   Tdap 02/06/2017   Zoster Recombinat (Shingrix) 01/28/2019, 04/05/2019   Zoster, Live 07/31/2012    Conditions to be addressed/monitored:  Hypertension, Hyperlipidemia, Diabetes, and Osteopenia  Care Plan : General Pharmacy (Adult)  Updates made by FCharlton Haws RTehamasince 11/05/2021 12:00 AM     Problem: Hypertension, Hyperlipidemia, Diabetes, and Osteopenia   Priority: High     Long-Range Goal: Disease mgmt   Start Date: 04/02/2021  Expected End Date: 07/09/2022  This Visit's Progress: On track  Recent Progress: On track  Priority: High  Note:   Current Barriers:  Unable to achieve control of diabetes   Pharmacist Clinical Goal(s):  Patient will contact provider office for questions/concerns as evidenced notation of same in electronic health record through collaboration with PharmD and provider.   Interventions: 1:1 collaboration with DTonia Ghent MD regarding development and update of comprehensive plan of care as evidenced by provider attestation and co-signature Inter-disciplinary care team collaboration (see longitudinal plan of care) Comprehensive medication review performed; medication list updated in electronic medical record  Hypertension (BP goal <130/80) -Controlled - per home BP readings; she has taken losartan since 2012, tolerates well. -Current home readings: 117/80  -Current treatment: Losartan 50 mg daily - Appropriate, Effective, Safe, Accessible -Medications previously tried: lisinopril (cough) -Current exercise habits: Stopped going to gym in  2020 after COVID restrictions. She gets a free membership with insurance and she would like to get back into working out. She has 3 acres which keeps her active in the Spring/Summer. We discussed importance of daily exercise, starting with 10-15 minute increments for heart health. -Recommended to continue current medication  Hyperlipidemia: (LDL goal < 70) -Not ideally controlled - LDL 84 (03/2021). LDL goal < 70 per ADA 2023 recommendations for patients with diabetes, age 69-75at increased cardiovascular risk.  -ASCVD risk 21% (high) -Current treatment: Simvastatin 40 mg daily - Appropriate, Query Effective -Medications previously tried: none - pt denies previous trials of statins or intolerances -Current dietary patterns: tries to follow low carb diet  -Current exercise habits: none formal -Educated on Cholesterol goals;  -Recommended to continue current medication; consider trial of high intensity statin  Diabetes (A1c goal <7%) -Query controlled -  A1c 8.8% (03/2021), pt reports a home health nurse checked it in Feb/March and it was 6.3%, [facility] but is not following with them right now due to scheduling conflicts, she wants PCP to check A1c soon -Family history: both grandfathers lost legs due to DM -Current home glucose readings: not checking -Denies hypoglycemic/hyperglycemic symptoms -Current medications: Metformin 500 mg - 2 tab BID - Appropriate, Query Effective Trulicity 3 mg weekly (PAP) - Appropriate, Query Effective Glimepiride 4 mg BID -Appropriate, Query Effective Testing supplies -Medications previously tried: Jardiance (yeast infections, cost) -Current meal patterns: She limits potatoes and rice. She has reduced  sugary drinks, drinks more water now; she has been eating a lot fruit (watermelon, cantoloupe) in the summer -Current exercise: none formal -Reviewed blood sugar goals: fasting < 130, 2-hr  post-prandial < 180; discussed what to do about high BG - drink water, go for a walk -Reviewed foods high in sugar: carbs, fruit -Recommended to continue current medication; scheduled PCP visit for A1c  Osteopenia (Goal: prevent fractures ) -Controlled -Last DEXA Scan: 03/07/21  T-Score femoral neck: -1.5   T-Score forearm radius: Normal 0.3  10-year probability of major osteoporotic fracture: 15.1%  10-year probability of hip fracture: 1.8% -Patient is not a candidate for pharmacologic treatment -She fell a couple years ago on ice. No falls since then. She tries to stay very cautious. Denies any history of hip fractures. -Current treatment  Vitamin D 2000 IU daily - Appropriate, Effective, Safe, Accessible -Medications previously tried: none   -Recommend [phone] units of vitamin D daily. Recommend 1200 mg of calcium daily from dietary and supplemental sources. Discussed calcium in diet.  -Recommended to continue current medication   Patient Goals/Self-Care Activities Patient will:  - take medications as prescribed as evidenced by patient report and record review focus on medication adherence by routine check glucose daily (rotate timings), document, and provide at future appointments     Medication Assistance:  [place] approved through 03/10/22  Compliance/Adherence/Medication fill history: Care Gaps: NONE  Star-Rating Drugs: Glimepiride 2 mg - PDC 100% Losartan - PDC 94% Metformin - PDC 100% Simvastatin - PDC 436% Trulicity - PAP  Medication Access: Within the past 30 days, how often has patient missed a dose of medication? Stopped Trulicity Is a pillbox or other method used to improve adherence? Yes  Factors that may affect medication adherence? adverse effects of medications and lack of understanding of disease management Are meds synced by current pharmacy? No  Are meds delivered by current pharmacy? Yes  Does patient experience delays in picking  up medications due to transportation concerns? No   Upstream Services Reviewed: Is patient disadvantaged to use [facility]?: Yes  Current Rx insurance plan: [facility] Name and location of Current pharmacy:  [id number] [place] [address]Phone: [phone]: [phone] [facility] [place] [address]Phone: [phone]: [phone] [facility] services reviewed with patient today?: No  Patient requests to transfer care to [facility]?: No  Reason patient declined to change pharmacies: Disadvantaged due to insurance/mail order   Care Plan and Follow Up Patient Decision:  Patient agrees to Care Plan and Follow-up.  Plan: Telephone follow up appointment with care management team member scheduled for:  4 months  LCharlene Brooke PharmD, BCACP Clinical Pharmacist [facility]Pr[facility] at [phone]

## 2021-11-07 DIAGNOSIS — H40003 Preglaucoma, unspecified, bilateral: Secondary | ICD-10-CM | POA: Diagnosis not present

## 2021-11-07 LAB — HM DIABETES EYE EXAM

## 2021-11-08 DIAGNOSIS — I10 Essential (primary) hypertension: Secondary | ICD-10-CM | POA: Diagnosis not present

## 2021-11-08 DIAGNOSIS — M8580 Other specified disorders of bone density and structure, unspecified site: Secondary | ICD-10-CM | POA: Diagnosis not present

## 2021-11-08 DIAGNOSIS — E1159 Type 2 diabetes mellitus with other circulatory complications: Secondary | ICD-10-CM | POA: Diagnosis not present

## 2021-11-08 DIAGNOSIS — Z794 Long term (current) use of insulin: Secondary | ICD-10-CM | POA: Diagnosis not present

## 2021-11-08 DIAGNOSIS — E785 Hyperlipidemia, unspecified: Secondary | ICD-10-CM

## 2021-11-16 ENCOUNTER — Encounter: Payer: Self-pay | Admitting: Family Medicine

## 2021-11-16 ENCOUNTER — Ambulatory Visit (INDEPENDENT_AMBULATORY_CARE_PROVIDER_SITE_OTHER): Payer: Medicare Other | Admitting: Family Medicine

## 2021-11-16 VITALS — BP 144/88 | HR 88 | Temp 97.3°F | Ht 64.0 in | Wt 158.4 lb

## 2021-11-16 DIAGNOSIS — E1165 Type 2 diabetes mellitus with hyperglycemia: Secondary | ICD-10-CM

## 2021-11-16 DIAGNOSIS — R350 Frequency of micturition: Secondary | ICD-10-CM | POA: Diagnosis not present

## 2021-11-16 DIAGNOSIS — R1310 Dysphagia, unspecified: Secondary | ICD-10-CM

## 2021-11-16 DIAGNOSIS — E119 Type 2 diabetes mellitus without complications: Secondary | ICD-10-CM

## 2021-11-16 DIAGNOSIS — R251 Tremor, unspecified: Secondary | ICD-10-CM | POA: Diagnosis not present

## 2021-11-16 LAB — POC URINALSYSI DIPSTICK (AUTOMATED)
Bilirubin, UA: NEGATIVE
Blood, UA: NEGATIVE
Glucose, UA: POSITIVE — AB
Ketones, UA: NEGATIVE
Leukocytes, UA: NEGATIVE
Nitrite, UA: NEGATIVE
Protein, UA: NEGATIVE
Spec Grav, UA: 1.025 (ref 1.010–1.025)
Urobilinogen, UA: 0.2 E.U./dL
pH, UA: 5 (ref 5.0–8.0)

## 2021-11-16 LAB — POCT GLYCOSYLATED HEMOGLOBIN (HGB A1C): Hemoglobin A1C: 8.8 % — AB (ref 4.0–5.6)

## 2021-11-16 MED ORDER — METFORMIN HCL 500 MG PO TABS: 500.0000 mg | ORAL_TABLET | Freq: Two times a day (BID) | ORAL | Status: DC

## 2021-11-16 NOTE — Progress Notes (Unsigned)
Diabetes:  Using medications without difficulties: see below.  Hypoglycemic episodes: not checked.   Hyperglycemic episodes: no sx Feet problems: no Blood Sugars averaging:not checked.   eye exam within last year: yes  She had presumed indigestion that didn't get better stopping trulicity.  She was off med for about 3 weeks, restarted 2 weeks ago.  No change in sx on/off med.  She isn't checking sugar at home.  She doesn't have clear exertional sx.  Drinking coffee makes it worse.  She can still take a deep breath.  She has occ food sticking with eating but that isn't happening a lot.  We talked about trying to get her a continuous glucose meter.  Referral ordered.  Had been taking '1000mg'$  metformin BID and having diarrhea.  D/w pt about cutting back to '500mg'$  BID.   Dysuria, frequency but not burning.    D/w pt about trial of icing for coccygeal discomfort.    L hand tremor, with extension.  No change from prior.  No R hand sx.  We agreed to monitor.    She'll get a flu shot later this year.    Meds, vitals, and allergies reviewed.  ROS: Per HPI unless specifically indicated in ROS section   GEN: nad, alert and oriented HEENT: ncat NECK: supple w/o LA CV: rrr. PULM: ctab, no inc wob ABD: soft, +bs EXT: no edema SKIN: no acute rash Left hand tremor noted with extension, faint.  No resting tremor.

## 2021-11-16 NOTE — Patient Instructions (Addendum)
Urine culture today.  We'll update you about that.    Cut the metformin back to 1 tab ('500mg'$ ) twice a day.  See if the diarrhea and indigestion get better.  If diarrhea isn't better after 1 week, then let me know. If indigestion isn't better, then try taking TUMS as needed.  If still not better, then let me know.    Let me see about getting a meter for your sugar.   Plan on recheck in about 3 months with labs at the visit but let me know how you are doing in the meantime.    Take care.  Glad to see you.  I would get a flu shot each fall.

## 2021-11-17 LAB — URINE CULTURE
MICRO NUMBER:: 13891726
SPECIMEN QUALITY:: ADEQUATE

## 2021-11-18 ENCOUNTER — Telehealth: Payer: Self-pay | Admitting: Family Medicine

## 2021-11-18 DIAGNOSIS — R350 Frequency of micturition: Secondary | ICD-10-CM | POA: Insufficient documentation

## 2021-11-18 DIAGNOSIS — R1310 Dysphagia, unspecified: Secondary | ICD-10-CM | POA: Insufficient documentation

## 2021-11-18 NOTE — Assessment & Plan Note (Signed)
If any worsening we need to get her to see GI.  See following phone note.

## 2021-11-18 NOTE — Assessment & Plan Note (Signed)
Could be related to diabetes.  See notes on labs and culture.

## 2021-11-18 NOTE — Telephone Encounter (Signed)
Please check with patient.  If her dysphagia does not resolve then I think it makes sense to see GI.  Let me know if she needs referral.  Thanks.

## 2021-11-18 NOTE — Assessment & Plan Note (Signed)
We talked about trying to get her a continuous glucose meter.  Referral ordered.  Cut the metformin back to 1 tab ('500mg'$ ) twice a day.  See if the diarrhea and indigestion get better.  If diarrhea isn't better after 1 week, then let me know. If indigestion isn't better, then try taking TUMS as needed.  If still not better, then let me know.    Plan on recheck in about 3 months with labs at the visit but let me know how you are doing in the meantime.

## 2021-11-18 NOTE — Assessment & Plan Note (Signed)
We agreed to monitor for now.

## 2021-11-19 ENCOUNTER — Telehealth: Payer: Self-pay | Admitting: Pharmacist

## 2021-11-19 NOTE — Telephone Encounter (Signed)
Spoke to patient by telephone and was advised that she is doing a little better. Patient stated that she would like to wait a couple of week and see how she is doing. Patient stated that she will give Dr. [name] an update in about two weeks and let him know if she wants a referral.  Patient stated that she is waiting on her urine test results.

## 2021-11-19 NOTE — Telephone Encounter (Signed)
Noted. Thanks.  See result note re: urine.  Released to patient.

## 2021-11-19 NOTE — Telephone Encounter (Signed)
Reviewed CGM coverage per request from PCP. Unfortunately, patient's insurance plan will not cover CGM since she is not on insulin and does not have documented history of Level 2 or 3 hypoglycemic event.  Patient would be able to do a 2-week trial of CGM using Libre 3 sample but would need smartphone for this. This may be helpful to identify patterns and adjust medications since patient is not checking sugars at home.  Patient agreed to 2-week trial of CGM using sample. Office appt scheduled 9/14

## 2021-11-22 ENCOUNTER — Ambulatory Visit (INDEPENDENT_AMBULATORY_CARE_PROVIDER_SITE_OTHER): Payer: Medicare Other | Admitting: Pharmacist

## 2021-11-22 DIAGNOSIS — E785 Hyperlipidemia, unspecified: Secondary | ICD-10-CM

## 2021-11-22 DIAGNOSIS — E1165 Type 2 diabetes mellitus with hyperglycemia: Secondary | ICD-10-CM

## 2021-11-22 NOTE — Progress Notes (Signed)
Chronic Care Management Pharmacy Note  11/29/2021 Name:  Deborah Alvarado MRN:  [id number] DOB:  08-19-1950  Summary: CCM F/U visit Patient presented for [facility] 3 training.  Demonstrated with teach-back: -components of sensor and how to prepare sensor for application -how to apply sensor to arm. Patient successfully applied sensor to L arm today -how to navigate [employer] app in order to pair sensor; sensor was successfully paired today -how to adjust high/low sugar alarms. Low alarm set to 70 and high alarm set to 250 today. -App users: demonstrated navigating various components including settings, logbook, and daily patterns  Educated patient on: -changing sensor every 14 days or as initiated by app/reader -avoiding high doses of Vitamin C > 500 mg/day -removing sensor prior to MRI, CT scans -if sugar readings ever seem wrong/questionable, check sugar with a finger stick -Lib[employer] cloud monitoring: Patient did enroll in [employer]  Provided customer service line for [employer] in case of sensor breakdowns: [phone]   Pt voiced understanding of above and denies further questions.  Plan: -Pharmacist follow up televisit scheduled for 2 weeks -PCP appt 02/15/22 (48mfu)    Subjective: Deborah PANKONINis an 71y.o. year old female who is a primary patient of Deborah Alvarado.  The CCM team was consulted for assistance with disease management and care coordination needs.    Engaged with patient by telephone for follow up visit in response to provider referral for pharmacy case management and/or care coordination services.   Consent to Services:  The patient was given information about Chronic Care Management services, agreed to services, and gave verbal consent prior to initiation of services.  Please see initial visit note for detailed documentation.   Patient Care Team: DTonia Ghent Alvarado as PCP - General OHonor JunesTArvid Right Alvarado as Consulting  Physician (Internal Medicine) KEulogio Bear Alvarado as Consulting Physician (Ophthalmology) FCharlton Haws RSurgery Center Of Bone And Joint Instituteas Pharmacist (Pharmacist)  Recent office visits: 04/26/21 NP TEugenia PancoastOV: vaginal candida, strep. Rx'd Amoxicillin and Fluconazole.  04/13/21 NP Tabitha Dugal OV: oral thrush, sinusitis. Rx'd Amoxicillin, Nystatin rinse.  03/20/21 Dr Deborah DunningsOV: Annual - reduced metformin to 500 mg  Recent consult visits: 05/17/21 Dr OLadell Pier(Endocrine): f/u DM. Pt reports A1c 6.3 per home health recently, unclear how accurate this is given A1c 8.8% in January and no changes since then. Advised to check sugar BID and call office in a week. F/U 3-4 months.  Hospital visits: None in previous 6 months   Objective:  Lab Results  Component Value Date   CREATININE 0.81 03/20/2021   BUN 15 03/20/2021   GFR 74.47 03/20/2021   GFRNONAA 68.49 01/02/2010   GFRAA 112 07/16/2007   NA 136 03/20/2021   K 4.7 03/20/2021   CALCIUM 10.0 03/20/2021   CO2 26 03/20/2021   GLUCOSE 188 (H) 03/20/2021    Lab Results  Component Value Date/Time   HGBA1C 8.8 (A) 11/16/2021 09:47 AM   HGBA1C 8.8 (H) 03/20/2021 11:44 AM   HGBA1C 9.8 11/11/2019 12:00 AM   HGBA1C 10.6 (H) 12/29/2017 11:52 AM   GFR 74.47 03/20/2021 11:44 AM   GFR 72.23 12/29/2017 11:52 AM   MICROALBUR 1.0 10/05/2008 09:54 AM   MICROALBUR 1.7 07/16/2007 09:40 AM    Last diabetic Eye exam:  Lab Results  Component Value Date/Time   HMDIABEYEEXA No Retinopathy 11/07/2021 12:00 AM    Last diabetic Foot exam:  Lab Results  Component Value Date/Time   HMDIABFOOTEX yes 12/15/2009  12:00 AM     Lab Results  Component Value Date   CHOL 150 03/20/2021   HDL 39.00 (L) 03/20/2021   LDLCALC 97 02/06/2017   LDLDIRECT 84.0 03/20/2021   TRIG 277.0 (H) 03/20/2021   CHOLHDL 4 03/20/2021       Latest Ref Rng & Units 03/20/2021   11:44 AM 12/29/2017   11:52 AM 02/06/2017    8:51 AM  Hepatic Function  Total Protein 6.0 - 8.3 g/dL 7.0   7.2  6.9   Albumin 3.5 - 5.2 g/dL 4.5  4.5  4.3   AST 0 - 37 U/L 28  21  35   ALT 0 - 35 U/L 32  30  43   Alk Phosphatase 39 - 117 U/L 78  96  86   Total Bilirubin 0.2 - 1.2 mg/dL 0.5  0.6  0.6     Lab Results  Component Value Date/Time   TSH 2.91 10/05/2008 09:54 AM   TSH 2.10 07/16/2007 09:40 AM       Latest Ref Rng & Units 10/05/2008    9:54 AM  CBC  WBC 4.5 - 10.5 10*3/microliter 5.2   Hemoglobin 12.0 - 15.0 g/dL 13.8   Hematocrit 36.0 - 46.0 % 40.6   Platelets 150.0 - 400.0 K/uL 220.0     Lab Results  Component Value Date/Time   VD25OH 45.56 03/20/2021 11:44 AM   VD25OH 39.94 04/17/2018 11:12 AM    Clinical ASCVD: No  The 10-year ASCVD risk score (Arnett DK, et al., 2019) is: 25.2%   Values used to calculate the score:     Age: 71 years     Sex: Female     Is Non-Hispanic African American: No     Diabetic: Yes     Tobacco smoker: No     Systolic Blood Pressure: 828 mmHg     Is BP treated: Yes     HDL Cholesterol: 39 mg/dL     Total Cholesterol: 150 mg/dL       01/23/2021    1:28 PM 02/28/2020    2:31 PM 12/29/2017   11:05 AM  Depression screen PHQ 2/9  Decreased Interest 0 0 0  Down, Depressed, Hopeless 0 0 0  PHQ - 2 Score 0 0 0     Social History   Tobacco Use  Smoking Status Never  Smokeless Tobacco Never   BP Readings from Last 3 Encounters:  11/16/21 (!) 144/88  04/26/21 (!) 148/74  04/13/21 (!) 164/112   Pulse Readings from Last 3 Encounters:  11/16/21 88  04/26/21 88  04/13/21 (!) 119   Wt Readings from Last 3 Encounters:  11/16/21 158 lb 6 oz (71.8 kg)  04/26/21 160 lb (72.6 kg)  04/13/21 161 lb (73 kg)   BMI Readings from Last 3 Encounters:  11/16/21 27.19 kg/m  04/26/21 27.46 kg/m  04/13/21 27.64 kg/m    Assessment/Interventions: Review of patient past medical history, allergies, medications, health status, including review of consultants reports, laboratory and other test data, was performed as part of comprehensive  evaluation and provision of chronic care management services.   SDOH:  (Social Determinants of Health) assessments and interventions performed: No - done Dec 2022-Feb 2023 SDOH Interventions    Flowsheet Row Chronic Care Management from 04/02/2021 in [place] at [place] from 02/07/2021 in [place] Interventions    Housing Interventions Intervention Not Indicated --  Financial Strain Interventions Other (Comment)  [Patient  assistance] Other (Comment)  [patient refered to Pharmacy consult no other financial needs in the donut hole presently]      SDOH Screenings   Food Insecurity: No Food Insecurity (01/23/2021)  Housing: Low Risk  (04/02/2021)  Transportation Needs: No Transportation Needs (01/23/2021)  Alcohol Screen: Low Risk  (01/23/2021)  Depression (PHQ2-9): Low Risk  (01/23/2021)  Financial Resource Strain: Medium Risk (04/02/2021)  Physical Activity: Inactive (01/23/2021)  Social Connections: Moderately Isolated (01/23/2021)  Tobacco Use: Low Risk  (11/16/2021)    CCM Care Plan  Allergies  Allergen Reactions   Lisinopril     Cough, presumed   Metformin And Related Other (See Comments)    Diarrhea at >1019m a day    Medications Reviewed Today     Reviewed by OLoreen Freud CMA (Certified Medical Assistant) on 11/16/21 at 0[place]uaList Status: <None>   Medication Order Taking? Sig Documenting Provider Last Dose Status Informant  Cholecalciferol (VITAMIN D) 50 MCG (2000 UT) tablet [id number] Take 1 tablet (2,000 Units total) by mouth daily. DTonia Ghent Alvarado Taking Active   Dulaglutide (TRULICITY) 3 MLK/4.4WNSBonney [id number] Inject 3 mg as directed once a week. DTonia Ghent Alvarado Taking Active            Med Note (Lenord Fellers LCleaster Corin  Tue Jul 03, 2021  9:20 AM) Via LRalph LeydenCares PAP  glimepiride (AMARYL) 4 MG tablet [id number] Take 4 mg by mouth daily with breakfast. OLonia Farber Alvarado Taking Active   losartan (COZAAR) 50 MG tablet [id number] TAKE 1 TABLET BY MOUTH DAILY DTonia Ghent Alvarado Taking Active   metFORMIN (GLUCOPHAGE) 500 MG tablet [id number] Take 1,000 mg by mouth 2 (two) times daily with a meal. OLonia Farber Alvarado Taking Active   nystatin (MYCOSTATIN) 100000 UNIT/ML suspension [id number] Take 5 ml po every 6 hours for seven days, swish around and retain in mouth for up to 30 seconds, then spit out. DEugenia Pancoast FNP Taking Active   simvastatin (ZOCOR) 40 MG tablet [id number] Take 1 tablet (40 mg total) by mouth at bedtime. DTonia Ghent Alvarado Taking Active             Patient Active Problem List   Diagnosis Date Noted   Urinary frequency 11/18/2021   Dysphagia 11/18/2021   Vaginal candida 04/26/2021   Strep pharyngitis 04/13/2021   Sore throat 04/13/2021   Oral thrush 04/13/2021   Dysuria 03/21/2021   Tremor of left hand 03/21/2021   Knee pain 03/21/2021   Healthcare maintenance 03/01/2020   Osteopenia 04/15/2018   Vaginal irritation 12/31/2017   Sinus tachycardia 12/31/2017   Cervical radiculitis 09/23/2017   Advance care planning 02/07/2017   Welcome to Medicare preventive visit 03/29/2011   Transaminitis 03/29/2011   Hyperlipidemia 07/13/2010   PLANTAR FASCIITIS, LEFT 01/20/2008   Diabetes mellitus without complication ([place] 032/20/2542  ANXIETY 07/31/2006   Essential hypertension 07/31/2006   ALLERGIC RHINITIS 07/31/2006   ACNE ROSACEA 04/11/2005    Immunization History  Administered Date(s) Administered   Hep A / Hep B 07/31/2012, 08/31/2012, 01/21/2013   Influenza Whole 12/23/2008, 12/15/2009   Influenza, High Dose Seasonal PF 01/03/2019   Influenza,inj,Quad PF,6+ Mos 11/25/2012, 01/08/2015, 12/29/2017   Influenza-Unspecified 12/06/2013, 12/25/2015, 12/23/2016, 01/10/2020, 11/23/2020   PFIZER Comirnaty(Gray Top)Covid-19 Tri-Sucrose Vaccine 01/17/2020   PFIZER(Purple Top)SARS-COV-2 Vaccination  04/12/2019, 05/09/2019, 01/17/2020   Pfizer Covid-19 Vaccine Bivalent Booster 181yr& up 01/21/2021   Pneumococcal Conjugate-13 12/29/2017,  01/05/2019   Pneumococcal Polysaccharide-23 12/25/2010   Td 04/11/2005   Tdap 02/06/2017   Zoster Recombinat (Shingrix) 01/28/2019, 04/05/2019   Zoster, Live 07/31/2012    Conditions to be addressed/monitored:  Hypertension, Hyperlipidemia, Diabetes, and Osteopenia  Care Plan : General Pharmacy (Adult)  Updates made by Charlton Haws, [place] since 11/29/2021 12:00 AM     Problem: Hypertension, Hyperlipidemia, Diabetes, and Osteopenia   Priority: High     Long-Range Goal: Disease mgmt   Start Date: 04/02/2021  Expected End Date: 07/09/2022  This Visit's Progress: On track  Recent Progress: On track  Priority: High  Note:   Current Barriers:  Unable to achieve control of diabetes   Pharmacist Clinical Goal(s):  Patient will contact provider office for questions/concerns as evidenced notation of same in electronic health record through collaboration with PharmD and provider.   Interventions: 1:1 collaboration with Tonia Ghent, Alvarado regarding development and update of comprehensive plan of care as evidenced by provider attestation and co-signature Inter-disciplinary care team collaboration (see longitudinal plan of care) Comprehensive medication review performed; medication list updated in electronic medical record  Hypertension (BP goal <130/80) -Controlled - per home BP readings; she has taken losartan since 2012, tolerates well. -Current home readings: 117/80  -Current treatment: Losartan 50 mg daily - Appropriate, Effective, Safe, Accessible -Medications previously tried: lisinopril (cough) -Current exercise habits: Stopped going to gym in 2020 after COVID restrictions. She gets a free membership with insurance and she would like to get back into working out. She has 3 acres which keeps her active in the Spring/Summer. We discussed  importance of daily exercise, starting with 10-15 minute increments for heart health. -Recommended to continue current medication  Hyperlipidemia: (LDL goal < 70) -Not ideally controlled - LDL 84 (03/2021). LDL goal < 70 per ADA 2023 recommendations for patients with diabetes, age 80-75 at increased cardiovascular risk.  -ASCVD risk 21% (high) -Current treatment: Simvastatin 40 mg daily - Appropriate, Query Effective -Medications previously tried: none - pt denies previous trials of statins or intolerances -Current dietary patterns: tries to follow low carb diet  -Current exercise habits: none formal -Educated on Cholesterol goals;  -Recommended to continue current medication; consider trial of high intensity statin  Diabetes (A1c goal <7%) -Query controlled -  A1c 8.8% (11/2021) remains above goal, she has transitioned from NP [facility] to Dr Ladell Pier at [place] [facility] but is not following with them right now due to scheduling conflicts -Family history: both grandfathers lost legs due to DM -Current home glucose readings: not checking -Denies hypoglycemic/hyperglycemic symptoms -Current medications: Metformin 500 mg - 2 tab BID - Appropriate, Query Effective Trulicity 3 mg weekly (PAP) - Appropriate, Query Effective Glimepiride 4 mg BID -Appropriate, Query Effective Testing supplies -Medications previously tried: Jardiance (yeast infections, cost) -Current meal patterns: She limits potatoes and rice. She has reduced sugary drinks, drinks more water now; she has been eating a lot fruit (watermelon, cantoloupe) in the summer -Current exercise: none formal -Reviewed blood sugar goals: fasting < 130, 2-hr post-prandial < 180; discussed what to do about high BG - drink water, go for a walk -Reviewed foods high in sugar: carbs, fruit -Freestyle Libre training completed 11/22/21 -Recommended to continue current medication  Osteopenia (Goal: prevent fractures ) -Controlled -Last DEXA  Scan: 03/07/21  T-Score femoral neck: -1.5   T-Score forearm radius: Normal 0.3  10-year probability of major osteoporotic fracture: 15.1%  10-year probability of hip fracture: 1.8% -Patient is not a candidate for pharmacologic treatment -She  fell a couple years ago on ice. No falls since then. She tries to stay very cautious. Denies any history of hip fractures. -Current treatment  Vitamin D 2000 IU daily - Appropriate, Effective, Safe, Accessible -Medications previously tried: none   -Recommend [phone] units of vitamin D daily. Recommend 1200 mg of calcium daily from dietary and supplemental sources. Discussed calcium in diet.  -Recommended to continue current medication   Patient Goals/Self-Care Activities Patient will:  - take medications as prescribed as evidenced by patient report and record review focus on medication adherence by routine check glucose daily (rotate timings), document, and provide at future appointments      Medication Assistance:  [place] approved through 03/10/22  Compliance/Adherence/Medication fill history: Care Gaps: NONE  Star-Rating Drugs: Glimepiride 2 mg - PDC 100% Losartan - PDC 94% Metformin - PDC 100% Simvastatin - PDC 131% Trulicity - PAP  Medication Access: Within the past 30 days, how often has patient missed a dose of medication? Stopped Trulicity Is a pillbox or other method used to improve adherence? Yes  Factors that may affect medication adherence? adverse effects of medications and lack of understanding of disease management Are meds synced by current pharmacy? No  Are meds delivered by current pharmacy? Yes  Does patient experience delays in picking up medications due to transportation concerns? No   Upstream Services Reviewed: Is patient disadvantaged to use [facility]?: Yes  Current Rx insurance plan: [facility] Name and location of Current pharmacy:  [id number] [place] [address]: [phone]: [phone] [facility] [place] [address] [address]Phone: [phone]: [phone] [facility] services reviewed with patient today?: No  Patient requests to transfer care to [facility] Pharmacy?: No  Reason patient declined to change pharmacies: Disadvantaged due to insurance/mail order   Care Plan and Follow Up Patient Decision:  Patient agrees to Care Plan and Follow-up.  Plan: Telephone follow up appointment with care management team member scheduled for:  4 months  LCharlene Brooke PharmD, BCACP Clinical Pharmacist [phone]

## 2021-11-29 NOTE — Patient Instructions (Signed)
Visit Information  Phone number for Pharmacist: [phone]   Goals Addressed   None     Care Plan : General Pharmacy (Adult)  Updates made by Charlton Haws, RPH since 11/29/2021 12:00 AM     Problem: Hypertension, Hyperlipidemia, Diabetes, and Osteopenia   Priority: High     Long-Range Goal: Disease mgmt   Start Date: 04/02/2021  Expected End Date: 07/09/2022  This Visit's Progress: On track  Recent Progress: On track  Priority: High  Note:   Current Barriers:  Unable to achieve control of diabetes   Pharmacist Clinical Goal(s):  Patient will contact provider office for questions/concerns as evidenced notation of same in electronic health record through collaboration with PharmD and provider.   Interventions: 1:1 collaboration with Tonia Ghent, MD regarding development and update of comprehensive plan of care as evidenced by provider attestation and co-signature Inter-disciplinary care team collaboration (see longitudinal plan of care) Comprehensive medication review performed; medication list updated in electronic medical record  Hypertension (BP goal <130/80) -Controlled - per home BP readings; she has taken losartan since 2012, tolerates well. -Current home readings: 117/80  -Current treatment: Losartan 50 mg daily - Appropriate, Effective, Safe, Accessible -Medications previously tried: lisinopril (cough) -Current exercise habits: Stopped going to gym in 2020 after COVID restrictions. She gets a free membership with insurance and she would like to get back into working out. She has 3 acres which keeps her active in the Spring/Summer. We discussed importance of daily exercise, starting with 10-15 minute increments for heart health. -Recommended to continue current medication  Hyperlipidemia: (LDL goal < 70) -Not ideally controlled - LDL 84 (03/2021). LDL goal < 70 per ADA 2023 recommendations for patients with diabetes, age 38-75 at increased cardiovascular  risk.  -ASCVD risk 21% (high) -Current treatment: Simvastatin 40 mg daily - Appropriate, Query Effective -Medications previously tried: none - pt denies previous trials of statins or intolerances -Current dietary patterns: tries to follow low carb diet  -Current exercise habits: none formal -Educated on Cholesterol goals;  -Recommended to continue current medication; consider trial of high intensity statin  Diabetes (A1c goal <7%) -Query controlled -  A1c 8.8% (11/2021) remains above goal, she has transitioned from NP [facility] to Dr Ladell Pier at [place] [facility] but is not following with them right now due to scheduling conflicts -Family history: both grandfathers lost legs due to DM -Current home glucose readings: not checking -Denies hypoglycemic/hyperglycemic symptoms -Current medications: Metformin 500 mg - 2 tab BID - Appropriate, Query Effective Trulicity 3 mg weekly (PAP) - Appropriate, Query Effective Glimepiride 4 mg BID -Appropriate, Query Effective Testing supplies -Medications previously tried: Jardiance (yeast infections, cost) -Current meal patterns: She limits potatoes and rice. She has reduced sugary drinks, drinks more water now; she has been eating a lot fruit (watermelon, cantoloupe) in the summer -Current exercise: none formal -Reviewed blood sugar goals: fasting < 130, 2-hr post-prandial < 180; discussed what to do about high BG - drink water, go for a walk -Reviewed foods high in sugar: carbs, fruit -Freestyle Libre training completed 11/22/21 -Recommended to continue current medication  Osteopenia (Goal: prevent fractures ) -Controlled -Last DEXA Scan: 03/07/21  T-Score femoral neck: -1.5   T-Score forearm radius: Normal 0.3  10-year probability of major osteoporotic fracture: 15.1%  10-year probability of hip fracture: 1.8% -Patient is not a candidate for pharmacologic treatment -She fell a couple years ago on ice. No falls since then. She tries to  stay very cautious. Denies any history of  hip fractures. -Current treatment  Vitamin D 2000 IU daily - Appropriate, Effective, Safe, Accessible -Medications previously tried: none   -Recommend [phone] units of vitamin D daily. Recommend 1200 mg of calcium daily from dietary and supplemental sources. Discussed calcium in diet.  -Recommended to continue current medication   Patient Goals/Self-Care Activities Patient will:  - take medications as prescribed as evidenced by patient report and record review focus on medication adherence by routine check glucose daily (rotate timings), document, and provide at future appointments      Patient verbalizes understanding of instructions and care plan provided today and agrees to view in [place]. Active MyChart status and patient understanding of how to access instructions and care plan via MyChart confirmed with patient.    Telephone follow up appointment with pharmacy team member scheduled for: 2 weeks  Charlene Brooke, PharmD, [facility] Clinical Pharmacist [place] [facility] at [facility] [phone]

## 2021-11-30 ENCOUNTER — Telehealth: Payer: Self-pay | Admitting: Family Medicine

## 2021-11-30 NOTE — Telephone Encounter (Signed)
Would try mucinex with plenty of water and claritin (not claritin D) in the meantime.  Hope she feels better soon.  Thanks.

## 2021-11-30 NOTE — Telephone Encounter (Signed)
Patient has been experiencing cough and congestion since last Friday,I Have her scheduled to come in to be seen on Monday 9/25. In the meantime she would like suggestions on something she can possibly take over the counter for this weekend to help with the cough until she's seen on Monday?

## 2021-11-30 NOTE — Telephone Encounter (Signed)
Spoke with patient and advised about med recommendations. Patient thanked [place] for the call back.

## 2021-12-03 ENCOUNTER — Encounter: Payer: Self-pay | Admitting: Family Medicine

## 2021-12-03 ENCOUNTER — Ambulatory Visit: Payer: Medicare Other | Admitting: Family Medicine

## 2021-12-03 ENCOUNTER — Telehealth: Payer: Self-pay

## 2021-12-03 VITALS — BP 130/90 | HR 91 | Temp 98.4°F | Ht 64.0 in | Wt 158.0 lb

## 2021-12-03 DIAGNOSIS — E119 Type 2 diabetes mellitus without complications: Secondary | ICD-10-CM

## 2021-12-03 DIAGNOSIS — R051 Acute cough: Secondary | ICD-10-CM

## 2021-12-03 LAB — POC COVID19 BINAXNOW: SARS Coronavirus 2 Ag: NEGATIVE

## 2021-12-03 MED ORDER — DOXYCYCLINE HYCLATE 100 MG PO TABS: 100.0000 mg | ORAL_TABLET | Freq: Two times a day (BID) | ORAL | 0 refills | Status: DC

## 2021-12-03 NOTE — Progress Notes (Signed)
    Chronic Care Management Pharmacy Assistant   Name: Deborah Alvarado  MRN: [id number] DOB: 11/08/1950  Reason for Encounter: CCM (Appointment Reminder)  Medications: Outpatient Encounter Medications as of 12/03/2021  Medication Sig Note   Cholecalciferol (VITAMIN D) 50 MCG (2000 UT) tablet Take 1 tablet (2,000 Units total) by mouth daily.    Dulaglutide (TRULICITY) 3 FD/7.4UZ SOPN Inject 3 mg as directed once a week. 07/03/2021: Via [employer]ares PAP   glimepiride (AMARYL) 4 MG tablet Take 4 mg by mouth daily with breakfast.    losartan (COZAAR) 50 MG tablet TAKE 1 TABLET BY MOUTH DAILY    metFORMIN (GLUCOPHAGE) 500 MG tablet Take 1 tablet (500 mg total) by mouth 2 (two) times daily with a meal.    simvastatin (ZOCOR) 40 MG tablet Take 1 tablet (40 mg total) by mouth at bedtime.    No facility-administered encounter medications on file as of 12/03/2021.   [name] was contacted to remind of upcoming telephone visit with Charlene Brooke on 12/06/2021 at 2:15. Patient was reminded to have any blood glucose and blood pressure readings available for review at appointment.   Patient confirmed appointment.  Are you having any problems with your medications? No   Do you have any concerns you like to discuss with the pharmacist? No  CCM referral has been placed prior to visit?  Yes   Star Rating Drugs: Medication:  Last Fill: Day Supply Trulicity 3 mg  PAP Losartan 50 mg 11/27/21 100 Metformin 500 mg 11/05/21 100 Simvastatin 40 mg 10/27/21 100 Glimepiride 2 mg 10/27/21 100 Verified with [place], [place] notified  Marijean Niemann, [place] Pharmacy Assistant [phone]

## 2021-12-03 NOTE — Progress Notes (Signed)
Carrington Olazabal T. Efren Kross, MD, [place] at [facility] [place], [address]  Phone: [phone]  FAX: [phone]  Deborah Alvarado - 71 y.o. female  MRN [id number]  Date of Birth: [birth year]  Date: 12/03/2021  PCP: Tonia Ghent, MD  Referral: Tonia Ghent, MD  Chief Complaint  Patient presents with   Cough     > 1 week with greenish/clear phlegm   Nasal Congestion   Hyperglycemia   Subjective:   Deborah Alvarado is a 71 y.o. very pleasant female patient with Body mass index is 27.12 kg/m. who presents with the following:  She presents with ongoing cough and congestion.  10 days ago, has been having a cough.  Nagging, and she cannot get rid of it.  Mucinex DM has not been helping.  She generally does not feel well, but she denies diffuse polyarthralgias and myalgias.  She is eating and drinking okay, she has not been running a fever.  No diarrhea, nausea, vomiting, or other GI symptoms.  No neurological changes.  No dysuria.  BS has been > 300.   Lab Results  Component Value Date   HGBA1C 8.8 (A) 11/16/2021      Review of Systems is noted in the HPI, as appropriate  Objective:   BP (!) 130/90   Pulse 91   Temp 98.4 F (36.9 C) (Oral)   Ht '5\' 4"'$  (1.626 m)   Wt 158 lb (71.7 kg)   SpO2 97%   BMI 27.12 kg/m    Gen: WDWN, NAD. Globally Non-toxic HEENT: Throat clear, w/o exudate, R TM clear, L TM - good landmarks, No fluid present. rhinnorhea.  MMM Frontal sinuses: NT Max sinuses: NT NECK: Anterior cervical  LAD is absent CV: RRR, No M/G/R, cap refill <2 sec PULM: Breathing comfortably in no respiratory distress. no wheezing, crackles, rhonchi   Laboratory and Imaging Data: Results for orders placed or performed in visit on 12/03/21  POC COVID-19  Result Value Ref Range   SARS Coronavirus 2 Ag Negative Negative     Assessment and Plan:     ICD-10-CM   1. Acute cough  R05.1 POC COVID-19     2. Diabetes mellitus without complication ([place])  G25.4      Acute prolonged respiratory illness with greater than 10 days.  I think at this point is reasonable to cover for atypicals and 71 year old patient.  Continue with supportive care, fluids, analgesics if needed and Robitussin-DM/Mucinex.  Right now, her diabetes is also decompensated some, likely as a result of acute illness.  Medication Management during today's office visit: Meds ordered this encounter  Medications   doxycycline (VIBRA-TABS) 100 MG tablet    Sig: Take 1 tablet (100 mg total) by mouth 2 (two) times daily.    Dispense:  20 tablet    Refill:  0   There are no discontinued medications.  Orders placed today for conditions managed today: Orders Placed This Encounter  Procedures   POC COVID-19    Disposition: No follow-ups on file.  Dragon [employer] One speech-to-text software was used for transcription in this dictation.  Possible transcriptional errors can occur using Editor, commissioning.   Signed,  Maud Deed. Deaira Leckey, MD   Outpatient Encounter Medications as of 12/03/2021  Medication Sig   Cholecalciferol (VITAMIN D) 50 MCG (2000 UT) tablet Take 1 tablet (2,000 Units total) by mouth daily.   doxycycline (VIBRA-TABS) 100 MG tablet Take 1 tablet (  100 mg total) by mouth 2 (two) times daily.   Dulaglutide (TRULICITY) 3 AW/8.9NW SOPN Inject 3 mg as directed once a week.   glimepiride (AMARYL) 4 MG tablet Take 4 mg by mouth daily with breakfast.   losartan (COZAAR) 50 MG tablet TAKE 1 TABLET BY MOUTH DAILY   metFORMIN (GLUCOPHAGE) 500 MG tablet Take 1 tablet (500 mg total) by mouth 2 (two) times daily with a meal.   simvastatin (ZOCOR) 40 MG tablet Take 1 tablet (40 mg total) by mouth at bedtime.   No facility-administered encounter medications on file as of 12/03/2021.

## 2021-12-06 ENCOUNTER — Telehealth: Payer: Self-pay | Admitting: Pharmacist

## 2021-12-06 ENCOUNTER — Ambulatory Visit: Payer: Medicare Other | Admitting: Pharmacist

## 2021-12-06 DIAGNOSIS — I10 Essential (primary) hypertension: Secondary | ICD-10-CM

## 2021-12-06 DIAGNOSIS — E1165 Type 2 diabetes mellitus with hyperglycemia: Secondary | ICD-10-CM

## 2021-12-06 DIAGNOSIS — E785 Hyperlipidemia, unspecified: Secondary | ICD-10-CM

## 2021-12-06 NOTE — Progress Notes (Signed)
Chronic Care Management Pharmacy Note  12/06/2021 Name:  Deborah Alvarado MRN:  [id number] DOB:  [birth year]  Summary: CCM F/U visit -Discussed DM: A1c 8.8% (11/2021), pt wore Freestyle Libre the past 2 weeks (one time sample since insurance will not cover CGM long term) -Reviewed AGP report: 11/21/21 to 12/04/21. Sensor active: 80%             Time in range (70-180): 8% (goal > 70%)             High (>180): 92% (Very high > 250: 38%)             Low (< 70): 0% (goal < 4%)             GMI: 9.1%; Average glucose: 242 -Per CGM sugars are consistently elevated; she would benefit from basal insulin but refuses and wants to put off insulin as long as possible; other DM options include Actos or switching Trulicity to Ozempic/Mounjaro; she gets Trulicity through PAP so opted to increase dose first   Recommendations/Changes made from today's visit: -Increase Trulicity to 4.5 mg weekly (through PAP) -Advised pt to check BG once a day, alternating time of day  Plan: -Pharmacist follow up televisit scheduled for 6 weeks -PCP appt 02/15/22 (23mfu)    Subjective: Deborah RUNDLEis an 71y.o. year old female who is a primary patient of DDamita Dunnings GElveria Rising MD.  The CCM team was consulted for assistance with disease management and care coordination needs.    Engaged with patient by telephone for follow up visit in response to provider referral for pharmacy case management and/or care coordination services.   Consent to Services:  The patient was given information about Chronic Care Management services, agreed to services, and gave verbal consent prior to initiation of services.  Please see initial visit note for detailed documentation.   Patient Care Team: DTonia Ghent MD as PCP - General OHonor JunesTArvid Right MD as Consulting Physician (Internal Medicine) KEulogio Bear MD as Consulting Physician (Ophthalmology) FCharlton Haws RKaiser Fnd Hosp - Walnut Creekas Pharmacist (Pharmacist)  Recent office  visits: 04/26/21 NP TEugenia PancoastOV: vaginal candida, strep. Rx'd Amoxicillin and Fluconazole.  04/13/21 NP Tabitha Dugal OV: oral thrush, sinusitis. Rx'd Amoxicillin, Nystatin rinse.  03/20/21 Dr DDamita DunningsOV: Annual - reduced metformin to 500 mg  Recent consult visits: 05/17/21 Dr OLadell Pier(Endocrine): f/u DM. Pt reports A1c 6.3 per home health recently, unclear how accurate this is given A1c 8.8% in January and no changes since then. Advised to check sugar BID and call office in a week. F/U 3-4 months.  Hospital visits: None in previous 6 months   Objective:  Lab Results  Component Value Date   CREATININE 0.81 03/20/2021   BUN 15 03/20/2021   GFR 74.47 03/20/2021   GFRNONAA 68.49 01/02/2010   GFRAA 112 07/16/2007   NA 136 03/20/2021   K 4.7 03/20/2021   CALCIUM 10.0 03/20/2021   CO2 26 03/20/2021   GLUCOSE 188 (H) 03/20/2021    Lab Results  Component Value Date/Time   HGBA1C 8.8 (A) 11/16/2021 09:47 AM   HGBA1C 8.8 (H) 03/20/2021 11:44 AM   HGBA1C 9.8 11/11/2019 12:00 AM   HGBA1C 10.6 (H) 12/29/2017 11:52 AM   GFR 74.47 03/20/2021 11:44 AM   GFR 72.23 12/29/2017 11:52 AM   MICROALBUR 1.0 10/05/2008 09:54 AM   MICROALBUR 1.7 07/16/2007 09:40 AM    Last diabetic Eye exam:  Lab Results  Component Value Date/Time  HMDIABEYEEXA No Retinopathy 11/07/2021 12:00 AM    Last diabetic Foot exam:  Lab Results  Component Value Date/Time   HMDIABFOOTEX yes 12/15/2009 12:00 AM     Lab Results  Component Value Date   CHOL 150 03/20/2021   HDL 39.00 (L) 03/20/2021   LDLCALC 97 02/06/2017   LDLDIRECT 84.0 03/20/2021   TRIG 277.0 (H) 03/20/2021   CHOLHDL 4 03/20/2021       Latest Ref Rng & Units 03/20/2021   11:44 AM 12/29/2017   11:52 AM 02/06/2017    8:51 AM  Hepatic Function  Total Protein 6.0 - 8.3 g/dL 7.0  7.2  6.9   Albumin 3.5 - 5.2 g/dL 4.5  4.5  4.3   AST 0 - 37 U/L 28  21  35   ALT 0 - 35 U/L 32  30  43   Alk Phosphatase 39 - 117 U/L 78  96  86   Total  Bilirubin 0.2 - 1.2 mg/dL 0.5  0.6  0.6     Lab Results  Component Value Date/Time   TSH 2.91 10/05/2008 09:54 AM   TSH 2.10 07/16/2007 09:40 AM       Latest Ref Rng & Units 10/05/2008    9:54 AM  CBC  WBC 4.5 - 10.5 10*3/microliter 5.2   Hemoglobin 12.0 - 15.0 g/dL 13.8   Hematocrit 36.0 - 46.0 % 40.6   Platelets 150.0 - 400.0 K/uL 220.0     Lab Results  Component Value Date/Time   VD25OH 45.56 03/20/2021 11:44 AM   VD25OH 39.94 04/17/2018 11:12 AM    Clinical ASCVD: No  The 10-year ASCVD risk score (Arnett DK, et al., 2019) is: 21.1%   Values used to calculate the score:     Age: 71 years     Sex: Female     Is Non-Hispanic African American: No     Diabetic: Yes     Tobacco smoker: No     Systolic Blood Pressure: 237 mmHg     Is BP treated: Yes     HDL Cholesterol: 39 mg/dL     Total Cholesterol: 150 mg/dL       01/23/2021    1:28 PM 02/28/2020    2:31 PM 12/29/2017   11:05 AM  Depression screen PHQ 2/9  Decreased Interest 0 0 0  Down, Depressed, Hopeless 0 0 0  PHQ - 2 Score 0 0 0     Social History   Tobacco Use  Smoking Status Never  Smokeless Tobacco Never   BP Readings from Last 3 Encounters:  12/03/21 (!) 130/90  11/16/21 (!) 144/88  04/26/21 (!) 148/74   Pulse Readings from Last 3 Encounters:  12/03/21 91  11/16/21 88  04/26/21 88   Wt Readings from Last 3 Encounters:  12/03/21 158 lb (71.7 kg)  11/16/21 158 lb 6 oz (71.8 kg)  04/26/21 160 lb (72.6 kg)   BMI Readings from Last 3 Encounters:  12/03/21 27.12 kg/m  11/16/21 27.19 kg/m  04/26/21 27.46 kg/m    Assessment/Interventions: Review of patient past medical history, allergies, medications, health status, including review of consultants reports, laboratory and other test data, was performed as part of comprehensive evaluation and provision of chronic care management services.   SDOH:  (Social Determinants of Health) assessments and interventions performed: No - done Dec  2022-Feb 2023 SDOH Interventions    Flowsheet Row Chronic Care Management from 04/02/2021 in [place] at [place] from 02/07/2021 in [employer]  [place] Interventions    Housing Interventions Intervention Not Indicated --  Financial Strain Interventions Other (Comment)  [Patient assistance] Other (Comment)  [patient refered to Pharmacy consult no other financial needs in the donut hole presently]      SDOH Screenings   Food Insecurity: No Food Insecurity (01/23/2021)  Housing: Low Risk  (04/02/2021)  Transportation Needs: No Transportation Needs (01/23/2021)  Alcohol Screen: Low Risk  (01/23/2021)  Depression (PHQ2-9): Low Risk  (01/23/2021)  Financial Resource Strain: Medium Risk (04/02/2021)  Physical Activity: Inactive (01/23/2021)  Social Connections: Moderately Isolated (01/23/2021)  Tobacco Use: Low Risk  (12/03/2021)    CCM Care Plan  Allergies  Allergen Reactions   Lisinopril     Cough, presumed   Metformin And Related Other (See Comments)    Diarrhea at >1090m a day    Medications Reviewed Today     Reviewed by LCarter Kitten CMA (Certified Medical Assistant) on 12/03/21 at 1142  Med List Status: <None>   Medication Order Taking? Sig Documenting Provider Last Dose Status Informant  Cholecalciferol (VITAMIN D) 50 MCG (2000 UT) tablet [id number] Take 1 tablet (2,000 Units total) by mouth daily. DTonia Ghent MD  Active   Dulaglutide (TRULICITY) 3 MAO/1.3YQSBonney [id number] Inject 3 mg as directed once a week. DTonia Ghent MD  Active            Med Note (Lenord Fellers LM[facility] LLCN   Tue Jul 03, 2021  9:20 AM) Via LRalph LeydenCares PAP  glimepiride (AMARYL) 4 MG tablet [id number] Take 4 mg by mouth daily with breakfast. OLonia Farber MD  Active   losartan (COZAAR) 50 MG tablet [id number] TAKE 1 TABLET BY MOUTH DAILY DTonia Ghent MD  Active   metFORMIN (GLUCOPHAGE) 500 MG tablet [id number] Take 1  tablet (500 mg total) by mouth 2 (two) times daily with a meal. DTonia Ghent MD  Active   simvastatin (ZOCOR) 40 MG tablet [id number] Take 1 tablet (40 mg total) by mouth at bedtime. DTonia Ghent MD  Active             Patient Active Problem List   Diagnosis Date Noted   Dysphagia 11/18/2021   Dysuria 03/21/2021   Tremor of left hand 03/21/2021   Knee pain 03/21/2021   Healthcare maintenance 03/01/2020   Osteopenia 04/15/2018   Sinus tachycardia 12/31/2017   Cervical radiculitis 09/23/2017   Advance care planning 02/07/2017   Welcome to Medicare preventive visit 03/29/2011   Transaminitis 03/29/2011   Hyperlipidemia 07/13/2010   PLANTAR FASCIITIS, LEFT 01/20/2008   Diabetes mellitus without complication ([place] 064/33/2951  ANXIETY 07/31/2006   Essential hypertension 07/31/2006   ALLERGIC RHINITIS 07/31/2006   ACNE ROSACEA 04/11/2005    Immunization History  Administered Date(s) Administered   Hep A / Hep B 07/31/2012, 08/31/2012, 01/21/2013   Influenza Whole 12/23/2008, 12/15/2009   Influenza, High Dose Seasonal PF 01/03/2019   Influenza,inj,Quad PF,6+ Mos 11/25/2012, 01/08/2015, 12/29/2017   Influenza-Unspecified 12/06/2013, 12/25/2015, 12/23/2016, 01/10/2020, 11/23/2020   PFIZER Comirnaty(Gray Top)Covid-19 Tri-Sucrose Vaccine 01/17/2020   PFIZER(Purple Top)SARS-COV-2 Vaccination 04/12/2019, 05/09/2019, 01/17/2020   Pfizer Covid-19 Vaccine Bivalent Booster 135yr& up 01/21/2021   Pneumococcal Conjugate-13 12/29/2017, 01/05/2019   Pneumococcal Polysaccharide-23 12/25/2010   Td 04/11/2005   Tdap 02/06/2017   Zoster Recombinat (Shingrix) 01/28/2019, 04/05/2019   Zoster, Live 07/31/2012    Conditions to be addressed/monitored:  Hypertension, Hyperlipidemia, Diabetes, and Osteopenia  Care Plan :  General Pharmacy (Adult)  Updates made by Charlton Haws, RPH since 12/07/2021 12:00 AM     Problem: Hypertension, Hyperlipidemia, Diabetes, and Osteopenia    Priority: High     Long-Range Goal: Disease mgmt   Start Date: 04/02/2021  Expected End Date: 07/09/2022  This Visit's Progress: On track  Recent Progress: On track  Priority: High  Note:   Current Barriers:  Unable to achieve control of diabetes   Pharmacist Clinical Goal(s):  Patient will contact provider office for questions/concerns as evidenced notation of same in electronic health record through collaboration with PharmD and provider.   Interventions: 1:1 collaboration with Tonia Ghent, MD regarding development and update of comprehensive plan of care as evidenced by provider attestation and co-signature Inter-disciplinary care team collaboration (see longitudinal plan of care) Comprehensive medication review performed; medication list updated in electronic medical record  Hypertension (BP goal <130/80) -Controlled - per home BP readings; she has taken losartan since 2012, tolerates well. -Current home readings: 117/80  -Current treatment: Losartan 50 mg daily - Appropriate, Effective, Safe, Accessible -Medications previously tried: lisinopril (cough) -Current exercise habits: Stopped going to gym in 2020 after COVID restrictions. She gets a free membership with insurance and she would like to get back into working out. She has 3 acres which keeps her active in the Spring/Summer. We discussed importance of daily exercise, starting with 10-15 minute increments for heart health. -Recommended to continue current medication  Hyperlipidemia: (LDL goal < 70) -Not ideally controlled - LDL 84 (03/2021). LDL goal < 70 per ADA 2023 recommendations for patients with diabetes, age 59-75 at increased cardiovascular risk.  -ASCVD risk 21% (high) -Current treatment: Simvastatin 40 mg daily - Appropriate, Query Effective -Medications previously tried: none - pt denies previous trials of statins or intolerances -Current dietary patterns: tries to follow low carb diet  -Current exercise  habits: none formal -Educated on Cholesterol goals;  -Recommended to continue current medication; consider trial of high intensity statin  Diabetes (A1c goal <7%) -Uncontrolled-  A1c 8.8% (11/2021) remains above goal, she has transitioned from [facility] to Dr Ladell Pier at [facility] but is not following with them right now due to scheduling conflicts -Family history: both grandfathers lost legs due to DM -Current home glucose readings: not checking Reviewed AGP report: 11/21/21 to 12/04/21. Sensor active: 80%  Time in range (70-180): 8% (goal > 70%)  High (>180): 92% (Very high > 250: 38%)  Low (< 70): 0% (goal < 4%)  GMI: 9.1%; Average glucose: 242  -Current medications: Metformin 500 mg - 1 tab BID - Appropriate, Query Effective Trulicity 3 mg weekly (PAP) - Appropriate, Query Effective Glimepiride 4 mg BID (started ~2012) -Appropriate, Query Effective Testing supplies -Medications previously tried: Jardiance (yeast infections, cost) -Current meal patterns: She limits potatoes and rice. She has reduced sugary drinks, drinks more water now; she has been eating a lot fruit (watermelon, cantoloupe) in the summer -Based on CGM data, patient's sugars are consistently elevated (above 180 92% of the time, avg glu 242); she endorses compliance with metformin 1000 mg/day, glimepiride 4 mg BID, and Trulicity 3 mg/week. Discussed options to improve DM control include increasing Trulicity, adding basal insulin, or starting Actos. Pt is adamant about delaying insulin as long as possible possible. After discussing potential side effects of Actos (wt gain, edema, HF risk) she would like to avoid this too. -Recommended to increase Trulicity to 4.5 mg weekly (Rx to PAP)  Osteopenia (Goal: prevent fractures ) -Controlled -Last  DEXA Scan: 03/07/21  T-Score femoral neck: -1.5   T-Score forearm radius: Normal 0.3  10-year probability of major osteoporotic fracture: 15.1%  10-year probability  of hip fracture: 1.8% -Patient is not a candidate for pharmacologic treatment -She fell a couple years ago on ice. No falls since then. She tries to stay very cautious. Denies any history of hip fractures. -Current treatment  Vitamin D 2000 IU daily - Appropriate, Effective, Safe, Accessible -Medications previously tried: none   -Recommend [id number] units of vitamin D daily. Recommend 1200 mg of calcium daily from dietary and supplemental sources. Discussed calcium in diet.  -Recommended to continue current medication  Patient Goals/Self-Care Activities Patient will:  - take medications as prescribed as evidenced by patient report and record review focus on medication adherence by routine check glucose daily (rotate timings), document, and provide at future appointments    Medication Assistance:  [place] approved through 03/10/22  Compliance/Adherence/Medication fill history: Care Gaps: NONE  Star-Rating Drugs: Glimepiride 2 mg - PDC 100% Losartan - PDC 94% Metformin - PDC 100% Simvastatin - PDC 765% Trulicity - PAP  Medication Access: Within the past 30 days, how often has patient missed a dose of medication? Stopped Trulicity Is a pillbox or other method used to improve adherence? Yes  Factors that may affect medication adherence? adverse effects of medications and lack of understanding of disease management Are meds synced by current pharmacy? No  Are meds delivered by current pharmacy? Yes  Does patient experience delays in picking up medications due to transportation concerns? No   [facility] Services Reviewed: Is patient disadvantaged to use [facility]?: Yes  Current Rx insurance plan: [facility] Name and location of Current pharmacy:  [id number] [place] [address]Phone: [phone]: [phone] [address] [address]Phone: [phone]: [address] [address]Phone: [phone]: [phone] [facility] services reviewed with patient today?: No  Patient requests to transfer care to [facility] Pharm[facility]?: No  Reason patient declined to change pharmacies: Disadvantaged due to insurance/mail order   Care Plan and Follow Up Patient Decision:  Patient agrees to Care Plan and Follow-up.  Plan: Telephone follow up appointment with care management team member scheduled for:  6 weeks  LCharlene Brooke PharmD, BCACP Clinical Pharmacist [phone]

## 2021-12-06 NOTE — Telephone Encounter (Signed)
Patient wore Freestyle Libre sample for 2 weeks, her insurance does not cover sensors for continued use this this was a one-time thing.  Based on CGM data, patient's sugars are consistently elevated (above 180 92% of the time, avg glu 242); she endorses compliance with metformin 1000 mg/day, glimepiride 4 mg BID, and Trulicity 3 mg/week. Discussed options to improve DM control include increasing Trulicity, adding basal insulin, or starting Actos. Pt is adamant about delaying insulin as possible. After discussing potential side effects of Actos (wt gain, edema, HF risk) she would like to avoid this too.  Pt did follow with endocrine previously but does not have plans to return and asks PCP to help manage DM.  Recommend increasing Trulicity to 4.5 mg weekly - Rx to [facility] pharmacy [facility]). Routing to PCP for input.  Reviewed AGP report: 11/21/21 to 12/04/21. Sensor active: 80%  Time in range (70-180): 8% (goal > 70%)  High (>180): 92% (Very high > 250: 38%)  Low (< 70): 0% (goal < 4%)  GMI: 9.1%; Average glucose: 242

## 2021-12-07 MED ORDER — TRULICITY 4.5 MG/0.5ML ~~LOC~~ SOAJ: 4.5000 mg | SUBCUTANEOUS | 1 refills | Status: DC

## 2021-12-07 NOTE — Patient Instructions (Signed)
Visit Information  Phone number for Pharmacist: [phone]   Goals Addressed   None     Care Plan : General Pharmacy (Adult)  Updates made by Charlton Haws, RPH since 12/07/2021 12:00 AM     Problem: Hypertension, Hyperlipidemia, Diabetes, and Osteopenia   Priority: High     Long-Range Goal: Disease mgmt   Start Date: 04/02/2021  Expected End Date: 07/09/2022  This Visit's Progress: On track  Recent Progress: On track  Priority: High  Note:   Current Barriers:  Unable to achieve control of diabetes   Pharmacist Clinical Goal(s):  Patient will contact provider office for questions/concerns as evidenced notation of same in electronic health record through collaboration with PharmD and provider.   Interventions: 1:1 collaboration with Tonia Ghent, MD regarding development and update of comprehensive plan of care as evidenced by provider attestation and co-signature Inter-disciplinary care team collaboration (see longitudinal plan of care) Comprehensive medication review performed; medication list updated in electronic medical record  Hypertension (BP goal <130/80) -Controlled - per home BP readings; she has taken losartan since 2012, tolerates well. -Current home readings: 117/80  -Current treatment: Losartan 50 mg daily - Appropriate, Effective, Safe, Accessible -Medications previously tried: lisinopril (cough) -Current exercise habits: Stopped going to gym in 2020 after COVID restrictions. She gets a free membership with insurance and she would like to get back into working out. She has 3 acres which keeps her active in the Spring/Summer. We discussed importance of daily exercise, starting with 10-15 minute increments for heart health. -Recommended to continue current medication  Hyperlipidemia: (LDL goal < 70) -Not ideally controlled - LDL 84 (03/2021). LDL goal < 70 per ADA 2023 recommendations for patients with diabetes, age 67-75 at increased cardiovascular  risk.  -ASCVD risk 21% (high) -Current treatment: Simvastatin 40 mg daily - Appropriate, Query Effective -Medications previously tried: none - pt denies previous trials of statins or intolerances -Current dietary patterns: tries to follow low carb diet  -Current exercise habits: none formal -Educated on Cholesterol goals;  -Recommended to continue current medication; consider trial of high intensity statin  Diabetes (A1c goal <7%) -Uncontrolled-  A1c 8.8% (11/2021) remains above goal, she has transitioned from NP [facility] to Dr Ladell Pier at [place] [facility] but is not following with them right now due to scheduling conflicts -Family history: both grandfathers lost legs due to DM -Current home glucose readings: not checking Reviewed AGP report: 11/21/21 to 12/04/21. Sensor active: 80%  Time in range (70-180): 8% (goal > 70%)  High (>180): 92% (Very high > 250: 38%)  Low (< 70): 0% (goal < 4%)  GMI: 9.1%; Average glucose: 242  -Current medications: Metformin 500 mg - 1 tab BID - Appropriate, Query Effective Trulicity 3 mg weekly (PAP) - Appropriate, Query Effective Glimepiride 4 mg BID (started ~2012) -Appropriate, Query Effective Testing supplies -Medications previously tried: Jardiance (yeast infections, cost) -Current meal patterns: She limits potatoes and rice. She has reduced sugary drinks, drinks more water now; she has been eating a lot fruit (watermelon, cantoloupe) in the summer -Based on CGM data, patient's sugars are consistently elevated (above 180 92% of the time, avg glu 242); she endorses compliance with metformin 1000 mg/day, glimepiride 4 mg BID, and Trulicity 3 mg/week. Discussed options to improve DM control include increasing Trulicity, adding basal insulin, or starting Actos. Pt is adamant about delaying insulin as long as possible possible. After discussing potential side effects of Actos (wt gain, edema, HF risk) she would like to avoid  this too. -Recommended to  increase Trulicity to 4.5 mg weekly (Rx to PAP)  Osteopenia (Goal: prevent fractures ) -Controlled -Last DEXA Scan: 03/07/21  T-Score femoral neck: -1.5   T-Score forearm radius: Normal 0.3  10-year probability of major osteoporotic fracture: 15.1%  10-year probability of hip fracture: 1.8% -Patient is not a candidate for pharmacologic treatment -She fell a couple years ago on ice. No falls since then. She tries to stay very cautious. Denies any history of hip fractures. -Current treatment  Vitamin D 2000 IU daily - Appropriate, Effective, Safe, Accessible -Medications previously tried: none   -Recommend [phone] units of vitamin D daily. Recommend 1200 mg of calcium daily from dietary and supplemental sources. Discussed calcium in diet.  -Recommended to continue current medication  Patient Goals/Self-Care Activities Patient will:  - take medications as prescribed as evidenced by patient report and record review focus on medication adherence by routine check glucose daily (rotate timings), document, and provide at future appointments      Patient verbalizes understanding of instructions and care plan provided today and agrees to view in [place]. Active MyChart status and patient understanding of how to access instructions and care plan via MyChart confirmed with patient.    Telephone follow up appointment with pharmacy team member scheduled for: 6 weeks  Charlene Brooke, PharmD, BCACP Clinical Pharmacist [facility] at [facility] [phone]

## 2021-12-07 NOTE — Telephone Encounter (Signed)
Agreed.  Thanks.  I signed the order.  Would keep the f/u appointment she has here in 02/2022.

## 2021-12-08 DIAGNOSIS — Z7985 Long-term (current) use of injectable non-insulin antidiabetic drugs: Secondary | ICD-10-CM

## 2021-12-08 DIAGNOSIS — E1159 Type 2 diabetes mellitus with other circulatory complications: Secondary | ICD-10-CM

## 2021-12-08 DIAGNOSIS — I10 Essential (primary) hypertension: Secondary | ICD-10-CM

## 2021-12-08 DIAGNOSIS — M8580 Other specified disorders of bone density and structure, unspecified site: Secondary | ICD-10-CM | POA: Diagnosis not present

## 2021-12-08 DIAGNOSIS — E785 Hyperlipidemia, unspecified: Secondary | ICD-10-CM | POA: Diagnosis not present

## 2021-12-10 ENCOUNTER — Telehealth: Payer: Self-pay

## 2021-12-10 NOTE — Progress Notes (Signed)
    Chronic Care Management Pharmacy Assistant   Name: Deborah Alvarado  MRN: [id number] DOB: 09/19/1950  Reason for Encounter: CCM (Trulicity 4.5 mg Increase)  Called [facility] to confirm patient is receiving Trulicity 4.5 mg. Patient will receive her medication by Wednesday at the latest. Called patient to inform her; no answer; left message.    Charlene Brooke, CPP notified  Marijean Niemann, [place] Clinical Pharmacy Assistant [phone]

## 2021-12-12 ENCOUNTER — Other Ambulatory Visit: Payer: Self-pay | Admitting: Family Medicine

## 2021-12-12 DIAGNOSIS — E785 Hyperlipidemia, unspecified: Secondary | ICD-10-CM

## 2021-12-12 DIAGNOSIS — M8580 Other specified disorders of bone density and structure, unspecified site: Secondary | ICD-10-CM

## 2021-12-12 DIAGNOSIS — E1165 Type 2 diabetes mellitus with hyperglycemia: Secondary | ICD-10-CM

## 2021-12-13 ENCOUNTER — Other Ambulatory Visit (INDEPENDENT_AMBULATORY_CARE_PROVIDER_SITE_OTHER): Payer: Medicare Other

## 2021-12-13 DIAGNOSIS — E785 Hyperlipidemia, unspecified: Secondary | ICD-10-CM | POA: Diagnosis not present

## 2021-12-13 DIAGNOSIS — E1165 Type 2 diabetes mellitus with hyperglycemia: Secondary | ICD-10-CM

## 2021-12-13 DIAGNOSIS — M8580 Other specified disorders of bone density and structure, unspecified site: Secondary | ICD-10-CM | POA: Diagnosis not present

## 2021-12-13 LAB — COMPREHENSIVE METABOLIC PANEL
ALT: 25 U/L (ref 0–35)
AST: 21 U/L (ref 0–37)
Albumin: 4.4 g/dL (ref 3.5–5.2)
Alkaline Phosphatase: 88 U/L (ref 39–117)
BUN: 12 mg/dL (ref 6–23)
CO2: 25 mEq/L (ref 19–32)
Calcium: 9.7 mg/dL (ref 8.4–10.5)
Chloride: 104 mEq/L (ref 96–112)
Creatinine, Ser: 0.88 mg/dL (ref 0.40–1.20)
GFR: 67.07 mL/min (ref >60.00)
Glucose, Bld: 191 mg/dL — ABNORMAL HIGH (ref 70–99)
Potassium: 4.4 mEq/L (ref 3.5–5.1)
Sodium: 138 mEq/L (ref 135–145)
Total Bilirubin: 0.6 mg/dL (ref 0.2–1.2)
Total Protein: 6.6 g/dL (ref 6.0–8.3)

## 2021-12-13 LAB — LIPID PANEL
Cholesterol: 141 mg/dL (ref 0–200)
HDL: 45 mg/dL (ref >39.00)
LDL Cholesterol: 59 mg/dL (ref 0–99)
NonHDL: 96.29
Total CHOL/HDL Ratio: 3
Triglycerides: 184 mg/dL — ABNORMAL HIGH (ref 0.0–149.0)
VLDL: 36.8 mg/dL (ref 0.0–40.0)

## 2021-12-13 LAB — VITAMIN D 25 HYDROXY (VIT D DEFICIENCY, FRACTURES): VITD: 48.29 ng/mL (ref 30.00–100.00)

## 2021-12-13 LAB — HEMOGLOBIN A1C: Hgb A1c MFr Bld: 8.8 % — ABNORMAL HIGH (ref 4.6–6.5)

## 2022-01-11 ENCOUNTER — Telehealth: Payer: Self-pay

## 2022-01-11 NOTE — Progress Notes (Signed)
    Chronic Care Management Pharmacy Assistant   Name: Deborah Alvarado  MRN: [id number] DOB: [birth year]  Reason for Encounter: CCM (Appointment Reminder)  Medications: Outpatient Encounter Medications as of 01/11/2022  Medication Sig Note   Cholecalciferol (VITAMIN D) 50 MCG (2000 UT) tablet Take 1 tablet (2,000 Units total) by mouth daily.    doxycycline (VIBRA-TABS) 100 MG tablet Take 1 tablet (100 mg total) by mouth 2 (two) times daily.    Dulaglutide (TRULICITY) 3 YH/0.6CB SOPN Inject 3 mg as directed once a week. 07/03/2021: Via [facility] PAP   Dulaglutide (TRULICITY) 4.5 JS/2.8BT SOPN Inject 4.5 mg as directed once a week.    glimepiride (AMARYL) 4 MG tablet Take 4 mg by mouth daily with breakfast.    losartan (COZAAR) 50 MG tablet TAKE 1 TABLET BY MOUTH DAILY    metFORMIN (GLUCOPHAGE) 500 MG tablet Take 1 tablet (500 mg total) by mouth 2 (two) times daily with a meal.    simvastatin (ZOCOR) 40 MG tablet Take 1 tablet (40 mg total) by mouth at bedtime.    No facility-administered encounter medications on file as of 01/11/2022.   Deborah Alvarado was contacted to remind of upcoming telephone visit with Charlene Brooke on 01/16/2022 at 11:00. Patient was reminded to have any blood glucose and blood pressure readings available for review at appointment.   Message was left reminding patient of appointment.  CCM referral has been placed prior to visit?  Yes   Star Rating Drugs: Medication:  Last Fill: Day Supply Glimepiride 2 mg  11/26/2021 100 Losartan 50 mg 11/26/2021 100 Metformin 500 Mg 11/04/2021 100  Simvastatin 40 mg 51/76/1607 371 Trulicity 4.5 mg PAP  Charlene Brooke, CPP notified  Marijean Niemann, [place] Pharmacy Assistant [phone]

## 2022-01-16 ENCOUNTER — Ambulatory Visit: Payer: Medicare Other | Admitting: Pharmacist

## 2022-01-16 DIAGNOSIS — E1165 Type 2 diabetes mellitus with hyperglycemia: Secondary | ICD-10-CM

## 2022-01-16 DIAGNOSIS — E785 Hyperlipidemia, unspecified: Secondary | ICD-10-CM

## 2022-01-16 DIAGNOSIS — I10 Essential (primary) hypertension: Secondary | ICD-10-CM

## 2022-01-16 NOTE — Patient Instructions (Signed)
Visit Information  Phone number for Pharmacist: [phone]   Goals Addressed   None     Care Plan : General Pharmacy (Adult)  Updates made by Charlton Haws, [place] since 01/16/2022 12:00 AM     Problem: Hypertension, Hyperlipidemia, Diabetes, and Osteopenia   Priority: High     Long-Range Goal: Disease mgmt   Start Date: 04/02/2021  Expected End Date: 07/09/2022  This Visit's Progress: On track  Recent Progress: On track  Priority: High  Note:   Current Barriers:  Unable to achieve control of diabetes   Pharmacist Clinical Goal(s):  Patient will contact provider office for questions/concerns as evidenced notation of same in electronic health record through collaboration with PharmD and provider.   Interventions: 1:1 collaboration with Tonia Ghent, MD regarding development and update of comprehensive plan of care as evidenced by provider attestation and co-signature Inter-disciplinary care team collaboration (see longitudinal plan of care) Comprehensive medication review performed; medication list updated in electronic medical record  Hypertension (BP goal <130/80) -Controlled - per home BP readings; she has taken losartan since 2012, tolerates well. -Current home readings: 117/80  -Current treatment: Losartan 50 mg daily - Appropriate, Effective, Safe, Accessible -Medications previously tried: lisinopril (cough) -Current exercise habits: Stopped going to gym in 2020 after COVID restrictions. She gets a free membership with insurance and she would like to get back into working out. She has 3 acres which keeps her active in the Spring/Summer. We discussed importance of daily exercise, starting with 10-15 minute increments for heart health. -Recommended to continue current medication  Hyperlipidemia: (LDL goal < 70) -Not ideally controlled - LDL 84 (03/2021). LDL goal < 70 per ADA 2023 recommendations for patients with diabetes, age 69-75 at increased cardiovascular  risk.  -ASCVD risk 21% (high) -Current treatment: Simvastatin 40 mg daily - Appropriate, Query Effective -Medications previously tried: none - pt denies previous trials of statins or intolerances -Educated on Cholesterol goals;  -Recommended to continue current medication; consider trial of high intensity statin  Diabetes (A1c goal <7%) -Uncontrolled-  A1c 8.8% (11/2021) remains above goal, pt wore CGM briefly in Sept (using sample) that showed consistently elevated glucose (see below) and Trulicity was increased to 4.5 mg; she injects in thigh and it is painful -she has transitioned from NP [facility] to Dr Ladell Pier at [place] [facility] but is not following with them right now due to scheduling conflicts -Family history: both grandfathers lost legs due to DM -Current home glucose readings: not checking -Previous AGP report: 11/21/21 to 12/04/21. Sensor active: 80%  Time in range (70-180): 8% (goal > 70%)  High (>180): 92% (Very high > 250: 38%)  Low (< 70): 0% (goal < 4%)  GMI: 9.1%; Average glucose: 242  -Current medications: Metformin 500 mg - 1 tab BID - Appropriate, Query Effective Trulicity 3 mg weekly (PAP) - Appropriate, Query Effective Glimepiride 4 mg BID (started ~2012) -Appropriate, Query Effective Testing supplies -Medications previously tried: Jardiance (yeast infections, cost), metformin 2g/day (diarrhea) -Current meal patterns: 2 coffees per day with cream and no sugar; -Based on CGM data, patient's sugars are consistently elevated (above 180 92% of the time, avg glu 242); she endorses compliance with metformin 1000 mg/day, glimepiride 4 mg BID, and Trulicity 3 mg/week. Discussed options to improve DM control include increasing Trulicity, adding basal insulin, or starting Actos. Pt is adamant about delaying insulin as long as possible. After discussing potential side effects of Actos (wt gain, edema, HF risk) she would like to avoid  this too. -Advised to inject Trulicity  in abdomen -Upcoming PCP visit 12/8 - repeat A1c; consider Mounjaro in future (2024 - PAP may be available)  Osteopenia (Goal: prevent fractures ) -Controlled -Last DEXA Scan: 03/07/21  T-Score femoral neck: -1.5   T-Score forearm radius: Normal 0.3  10-year probability of major osteoporotic fracture: 15.1%  10-year probability of hip fracture: 1.8% -Patient is not a candidate for pharmacologic treatment -She fell a couple years ago on ice. No falls since then. She tries to stay very cautious. Denies any history of hip fractures. -Current treatment  Vitamin D 2000 IU daily - Appropriate, Effective, Safe, Accessible -Medications previously tried: none   -Recommend [phone] units of vitamin D daily. Recommend 1200 mg of calcium daily from dietary and supplemental sources. Discussed calcium in diet.  -Recommended to continue current medication  Patient Goals/Self-Care Activities Patient will:  - take medications as prescribed as evidenced by patient report and record review focus on medication adherence by routine check glucose daily (rotate timings), document, and provide at future appointments      Patient verbalizes understanding of instructions and care plan provided today and agrees to view in [place]. Active MyChart status and patient understanding of how to access instructions and care plan via MyChart confirmed with patient.    Telephone follow up appointment with pharmacy team member scheduled for: 6 weeks  Charlene Brooke, PharmD, BCACP Clinical Pharmacist [facility] at [facility] [phone]

## 2022-01-16 NOTE — Progress Notes (Signed)
Chronic Care Management Pharmacy Note  01/16/2022 Name:  Deborah Alvarado MRN:  [id number] DOB:  23-Jul-1950  Summary: CCM F/U visit -Discussed DM: A1c 7.8% (04/9560), Trulicity was increased to 4.5 mg last month, pt has not been checking BG at home so unknown if this has helped -Future DM options: she would benefit from basal insulin but refuses and wants to put off insulin as long as possible; other DM options include Actos or switching Trulicity to Ozempic/Mounjaro   Recommendations/Changes made from today's visit: -No med changes; upcoming PCP appt - repeat A1c;  -Advised pt to check BG once a day, alternating time of day  Plan: -Pharmacist follow up televisit scheduled for 6 weeks -PCP appt 02/15/22 (62mfu)    Subjective: Deborah AUGSPURGERis an 71y.o. year old female who is a primary patient of DDamita Dunnings GElveria Rising MD.  The CCM team was consulted for assistance with disease management and care coordination needs.    Engaged with patient by telephone for follow up visit in response to provider referral for pharmacy case management and/or care coordination services.   Consent to Services:  The patient was given information about Chronic Care Management services, agreed to services, and gave verbal consent prior to initiation of services.  Please see initial visit note for detailed documentation.   Patient Care Team: DTonia Ghent MD as PCP - General OHonor JunesTArvid Right MD as Consulting Physician (Internal Medicine) KEulogio Bear MD as Consulting Physician (Ophthalmology) FCharlton Haws R[facility] Buildingas Pharmacist (Pharmacist)  Recent office visits: 04/26/21 NP TEugenia PancoastOV: vaginal candida, strep. Rx'd Amoxicillin and Fluconazole.  04/13/21 NP Tabitha Dugal OV: oral thrush, sinusitis. Rx'd Amoxicillin, Nystatin rinse.  03/20/21 Dr DDamita DunningsOV: Annual - reduced metformin to 500 mg  Recent consult visits: 05/17/21 Dr OLadell Pier(Endocrine): f/u DM. Pt reports A1c 6.3 per home  health recently, unclear how accurate this is given A1c 8.8% in January and no changes since then. Advised to check sugar BID and call office in a week. F/U 3-4 months.  Hospital visits: None in previous 6 months   Objective:  Lab Results  Component Value Date   CREATININE 0.88 12/13/2021   BUN 12 12/13/2021   GFR 67.07 12/13/2021   GFRNONAA 68.49 01/02/2010   GFRAA 112 07/16/2007   NA 138 12/13/2021   K 4.4 12/13/2021   CALCIUM 9.7 12/13/2021   CO2 25 12/13/2021   GLUCOSE 191 (H) 12/13/2021    Lab Results  Component Value Date/Time   HGBA1C 8.8 (H) 12/13/2021 11:05 AM   HGBA1C 8.8 (A) 11/16/2021 09:47 AM   HGBA1C 8.8 (H) 03/20/2021 11:44 AM   HGBA1C 9.8 11/11/2019 12:00 AM   GFR 67.07 12/13/2021 11:05 AM   GFR 74.47 03/20/2021 11:44 AM   MICROALBUR 1.0 10/05/2008 09:54 AM   MICROALBUR 1.7 07/16/2007 09:40 AM    Last diabetic Eye exam:  Lab Results  Component Value Date/Time   HMDIABEYEEXA No Retinopathy 11/07/2021 12:00 AM    Last diabetic Foot exam:  Lab Results  Component Value Date/Time   HMDIABFOOTEX yes 12/15/2009 12:00 AM     Lab Results  Component Value Date   CHOL 141 12/13/2021   HDL 45.00 12/13/2021   LDLCALC 59 12/13/2021   LDLDIRECT 84.0 03/20/2021   TRIG 184.0 (H) 12/13/2021   CHOLHDL 3 12/13/2021       Latest Ref Rng & Units 12/13/2021   11:05 AM 03/20/2021   11:44 AM 12/29/2017   11:52 AM  Hepatic Function  Total Protein 6.0 - 8.3 g/dL 6.6  7.0  7.2   Albumin 3.5 - 5.2 g/dL 4.4  4.5  4.5   AST 0 - 37 U/L _0 ALT 0 - 35 U/L 25  32  30   Alk Phosphatase 39 - 117 U/L 88  78  96   Total Bilirubin 0.2 - 1.2 mg/dL 0.6  0.5  0.6     Lab Results  Component Value Date/Time   TSH 2.91 10/05/2008 09:54 AM   TSH 2.10 07/16/2007 09:40 AM       Latest Ref Rng & Units 10/05/2008    9:54 AM  CBC  WBC 4.5 - 10.5 10*3/microliter 5.2   Hemoglobin 12.0 - 15.0 g/dL 13.8   Hematocrit 36.0 - 46.0 % 40.6   Platelets 150.0 - 400.0 K/uL  220.0     Lab Results  Component Value Date/Time   VD25OH 48.29 12/13/2021 11:05 AM   VD25OH 45.56 03/20/2021 11:44 AM    Clinical ASCVD: No  The 10-year ASCVD risk score (Arnett DK, et al., 2019) is: 20%   Values used to calculate the score:     Age: 57 years     Sex: Female     Is Non-Hispanic African American: No     Diabetic: Yes     Tobacco smoker: No     Systolic Blood Pressure: 291 mmHg     Is BP treated: Yes     HDL Cholesterol: 45 mg/dL     Total Cholesterol: 141 mg/dL       01/23/2021    1:28 PM 02/28/2020    2:31 PM 12/29/2017   11:05 AM  Depression screen PHQ 2/9  Decreased Interest 0 0 0  Down, Depressed, Hopeless 0 0 0  PHQ - 2 Score 0 0 0     Social History   Tobacco Use  Smoking Status Never  Smokeless Tobacco Never   BP Readings from Last 3 Encounters:  12/03/21 (!) 130/90  11/16/21 (!) 144/88  04/26/21 (!) 148/74   Pulse Readings from Last 3 Encounters:  12/03/21 91  11/16/21 88  04/26/21 88   Wt Readings from Last 3 Encounters:  12/03/21 158 lb (71.7 kg)  11/16/21 158 lb 6 oz (71.8 kg)  04/26/21 160 lb (72.6 kg)   BMI Readings from Last 3 Encounters:  12/03/21 27.12 kg/m  11/16/21 27.19 kg/m  04/26/21 27.46 kg/m    Assessment/Interventions: Review of patient past medical history, allergies, medications, health status, including review of consultants reports, laboratory and other test data, was performed as part of comprehensive evaluation and provision of chronic care management services.   SDOH:  (Social Determinants of Health) assessments and interventions performed: No - done Dec 2022-Feb 2023 SDOH Interventions    Flowsheet Row Chronic Care Management from 04/02/2021 in [place] at [place] from 02/07/2021 in [place] Interventions    Housing Interventions Intervention Not Indicated --  Financial Strain Interventions Other (Comment)  [Patient  assistance] Other (Comment)  [patient refered to Pharmacy consult no other financial needs in the donut hole presently]      SDOH Screenings   Food Insecurity: No Food Insecurity (01/23/2021)  Housing: Low Risk  (04/02/2021)  Transportation Needs: No Transportation Needs (01/23/2021)  Alcohol Screen: Low Risk  (01/23/2021)  Depression (PHQ2-9): Low Risk  (01/23/2021)  Financial Resource Strain: Medium Risk (04/02/2021)  Physical Activity: Inactive (01/23/2021)  Social Connections: Moderately Isolated (01/23/2021)  Tobacco Use: Low Risk  (12/03/2021)    CCM Care Plan  Allergies  Allergen Reactions   Lisinopril     Cough, presumed   Metformin And Related Other (See Comments)    Diarrhea at >1038m a day    Medications Reviewed Today     Reviewed by FCharlton Haws RPam [facility](Pharmacist) on 01/16/22 at 1121  Med List Status: <None>   Medication Order Taking? Sig Documenting Provider Last Dose Status Informant  Cholecalciferol (VITAMIN D) 50 MCG (2000 UT) tablet [id number] Take 1 tablet (2,000 Units total) by mouth daily. DTonia Ghent MD Taking Active   Dulaglutide (TRULICITY) 4.5 MFX/9.0WISBonney [id number] Inject 4.5 mg as directed once a week. DTonia Ghent MD Taking Active   glimepiride (AMARYL) 4 MG tablet [id number] Take 4 mg by mouth daily with breakfast. OLonia Farber MD Taking Active   losartan (COZAAR) 50 MG tablet [id number] TAKE 1 TABLET BY MOUTH DAILY DTonia Ghent MD Taking Active   metFORMIN (GLUCOPHAGE) 500 MG tablet [id number] Take 1 tablet (500 mg total) by mouth 2 (two) times daily with a meal. DTonia Ghent MD Taking Active   simvastatin (ZOCOR) 40 MG tablet [id number] Take 1 tablet (40 mg total) by mouth at bedtime. DTonia Ghent MD Taking Active             Patient Active Problem List   Diagnosis Date Noted   Dysphagia 11/18/2021   Dysuria 03/21/2021   Tremor of left hand 03/21/2021   Knee pain 03/21/2021    Healthcare maintenance 03/01/2020   Osteopenia 04/15/2018   Sinus tachycardia 12/31/2017   Cervical radiculitis 09/23/2017   Advance care planning 02/07/2017   Welcome to Medicare preventive visit 03/29/2011   Transaminitis 03/29/2011   Hyperlipidemia 07/13/2010   PLANTAR FASCIITIS, LEFT 01/20/2008   Diabetes mellitus without complication (HBrogden 031/49/7026  ANXIETY 07/31/2006   Essential hypertension 07/31/2006   ALLERGIC RHINITIS 07/31/2006   ACNE ROSACEA 04/11/2005    Immunization History  Administered Date(s) Administered   Hep A / Hep B 07/31/2012, 08/31/2012, 01/21/2013   Influenza Whole 12/23/2008, 12/15/2009   Influenza, High Dose Seasonal PF 01/03/2019   Influenza,inj,Quad PF,6+ Mos 11/25/2012, 01/08/2015, 12/29/2017   Influenza-Unspecified 12/06/2013, 12/25/2015, 12/23/2016, 01/10/2020, 11/23/2020   PFIZER Comirnaty(Gray Top)Covid-19 Tri-Sucrose Vaccine 01/17/2020   PFIZER(Purple Top)SARS-COV-2 Vaccination 04/12/2019, 05/09/2019, 01/17/2020   Pfizer Covid-19 Vaccine Bivalent Booster 162yr& up 01/21/2021   Pneumococcal Conjugate-13 12/29/2017, 01/05/2019   Pneumococcal Polysaccharide-23 12/25/2010   Td 04/11/2005   Tdap 02/06/2017   Zoster Recombinat (Shingrix) 01/28/2019, 04/05/2019   Zoster, Live 07/31/2012    Conditions to be addressed/monitored:  Hypertension, Hyperlipidemia, Diabetes, and Osteopenia  Care Plan : General Pharmacy (Adult)  Updates made by FoCharlton HawsRPAntietamince 01/16/2022 12:00 AM     Problem: Hypertension, Hyperlipidemia, Diabetes, and Osteopenia   Priority: High     Long-Range Goal: Disease mgmt   Start Date: 04/02/2021  Expected End Date: 07/09/2022  This Visit's Progress: On track  Recent Progress: On track  Priority: High  Note:   Current Barriers:  Unable to achieve control of diabetes   Pharmacist Clinical Goal(s):  Patient will contact provider office for questions/concerns as evidenced notation of same in electronic  health record through collaboration with PharmD and provider.   Interventions: 1:1 collaboration with DuTonia GhentMD regarding development and update of comprehensive plan of care as evidenced by provider  attestation and co-signature Inter-disciplinary care team collaboration (see longitudinal plan of care) Comprehensive medication review performed; medication list updated in electronic medical record  Hypertension (BP goal <130/80) -Controlled - per home BP readings; she has taken losartan since 2012, tolerates well. -Current home readings: 117/80  -Current treatment: Losartan 50 mg daily - Appropriate, Effective, Safe, Accessible -Medications previously tried: lisinopril (cough) -Current exercise habits: Stopped going to gym in 2020 after COVID restrictions. She gets a free membership with insurance and she would like to get back into working out. She has 3 acres which keeps her active in the Spring/Summer. We discussed importance of daily exercise, starting with 10-15 minute increments for heart health. -Recommended to continue current medication  Hyperlipidemia: (LDL goal < 70) -Not ideally controlled - LDL 84 (03/2021). LDL goal < 70 per ADA 2023 recommendations for patients with diabetes, age 64-75 at increased cardiovascular risk.  -ASCVD risk 21% (high) -Current treatment: Simvastatin 40 mg daily - Appropriate, Query Effective -Medications previously tried: none - pt denies previous trials of statins or intolerances -Educated on Cholesterol goals;  -Recommended to continue current medication; consider trial of high intensity statin  Diabetes (A1c goal <7%) -Uncontrolled-  A1c 8.8% (11/2021) remains above goal, pt wore CGM briefly in Sept (using sample) that showed consistently elevated glucose (see below) and Trulicity was increased to 4.5 mg -she has transitioned from NP Gertie Fey to Dr Ladell Pier at [facility] but is not following with them right now due to  scheduling conflicts -Family history: both grandfathers lost legs due to DM -Current home glucose readings: not checking -Previous AGP report: 11/21/21 to 12/04/21. Sensor active: 80%  Time in range (70-180): 8% (goal > 70%)  High (>180): 92% (Very high > 250: 38%)  Low (< 70): 0% (goal < 4%)  GMI: 9.1%; Average glucose: 242  -Current medications: Metformin 500 mg - 1 tab BID - Appropriate, Query Effective Trulicity 3 mg weekly (PAP) - Appropriate, Query Effective Glimepiride 4 mg BID (started ~2012) -Appropriate, Query Effective Testing supplies -Medications previously tried: Jardiance (yeast infections, cost), metformin 2g/day (diarrhea) -Current meal patterns: 2 coffees per day with cream and no sugar; -Based on CGM data, patient's sugars are consistently elevated (above 180 92% of the time, avg glu 242); she endorses compliance with metformin 1000 mg/day, glimepiride 4 mg BID, and Trulicity 3 mg/week. Discussed options to improve DM control include increasing Trulicity, adding basal insulin, or starting Actos. Pt is adamant about delaying insulin as long as possible. After discussing potential side effects of Actos (wt gain, edema, HF risk) she would like to avoid this too. -Upcoming PCP visit 12/8 - repeat A1c; consider Mounjaro in future (2024 - PAP may be available)  Osteopenia (Goal: prevent fractures ) -Controlled -Last DEXA Scan: 03/07/21  T-Score femoral neck: -1.5   T-Score forearm radius: Normal 0.3  10-year probability of major osteoporotic fracture: 15.1%  10-year probability of hip fracture: 1.8% -Patient is not a candidate for pharmacologic treatment -She fell a couple years ago on ice. No falls since then. She tries to stay very cautious. Denies any history of hip fractures. -Current treatment  Vitamin D 2000 IU daily - Appropriate, Effective, Safe, Accessible -Medications previously tried: none   -Recommend [phone] units of vitamin D daily. Recommend 1200 mg of  calcium daily from dietary and supplemental sources. Discussed calcium in diet.  -Recommended to continue current medication  Patient Goals/Self-Care Activities Patient will:  - take medications as prescribed as evidenced by patient report and  record review focus on medication adherence by routine check glucose daily (rotate timings), document, and provide at future appointments     Medication Assistance:  [place] approved through 03/10/22  Compliance/Adherence/Medication fill history: Care Gaps: NONE  Star-Rating Drugs: Glimepiride 2 mg - PDC 100% Losartan - PDC 100% Metformin - PDC 100% Simvastatin - PDC 041% Trulicity - PAP  Medication Access: Within the past 30 days, how often has patient missed a dose of medication? Stopped Trulicity Is a pillbox or other method used to improve adherence? Yes  Factors that may affect medication adherence? adverse effects of medications and lack of understanding of disease management Are meds synced by current pharmacy? No  Are meds delivered by current pharmacy? Yes  Does patient experience delays in picking up medications due to transportation concerns? No   [facility] Services Reviewed: Is patient disadvantaged to use [facility]?: Yes  Current Rx insurance plan: [facility] Name and location of Current pharmacy:  [id number] [place] [address] [place]Phone: [phone]: [phone] [place] [address]Phone: [phone]: [address] [address] [address]Phone: [phone]: [phone] [facility] services reviewed with patient today?: No  Patient requests to transfer care to [facility]?: No  Reason patient declined to change pharmacies:  Disadvantaged due to insurance/mail order   Care Plan and Follow Up Patient Decision:  Patient agrees to Care Plan and Follow-up.  Plan: Telephone follow up appointment with care management team member scheduled for:  6 weeks  LCharlene Brooke PharmD, BCACP Clinical Pharmacist [facility]ountifulPrimary [facility] at [phone]

## 2022-01-17 ENCOUNTER — Ambulatory Visit (INDEPENDENT_AMBULATORY_CARE_PROVIDER_SITE_OTHER): Payer: Medicare Other | Admitting: Nurse Practitioner

## 2022-01-17 VITALS — BP 140/94 | HR 67 | Temp 96.7°F | Resp 12 | Ht 64.0 in | Wt 156.4 lb

## 2022-01-17 DIAGNOSIS — R81 Glycosuria: Secondary | ICD-10-CM | POA: Diagnosis not present

## 2022-01-17 DIAGNOSIS — N3091 Cystitis, unspecified with hematuria: Secondary | ICD-10-CM

## 2022-01-17 DIAGNOSIS — R300 Dysuria: Secondary | ICD-10-CM | POA: Diagnosis not present

## 2022-01-17 LAB — POC URINALSYSI DIPSTICK (AUTOMATED)
Bilirubin, UA: NEGATIVE
Blood, UA: POSITIVE
Glucose, UA: POSITIVE — AB
Ketones, UA: NEGATIVE
Nitrite, UA: NEGATIVE
Protein, UA: POSITIVE — AB
Spec Grav, UA: 1.025 (ref 1.010–1.025)
Urobilinogen, UA: 0.2 E.U./dL
pH, UA: 5 (ref 5.0–8.0)

## 2022-01-17 MED ORDER — NITROFURANTOIN MONOHYD MACRO 100 MG PO CAPS: 100.0000 mg | ORAL_CAPSULE | Freq: Two times a day (BID) | ORAL | 0 refills | Status: AC

## 2022-01-17 NOTE — Patient Instructions (Signed)
Nice to see you today I have sent in some antibiotics. If you do not improve or start to get worse let me know I will be in touch with the urine culture results once I have them

## 2022-01-17 NOTE — Assessment & Plan Note (Signed)
UA indicative of urinary tract infection.  We will treat patient with Macrobid 100 mg twice daily for 5 days.  Pending urinary culture.  Follow-up if no improvement.  Did offer to send in some Pyridium the patient politely declined.

## 2022-01-17 NOTE — Assessment & Plan Note (Signed)
Noted on UA.  Has been present for least the past 2 months.  Patient does have a history of uncontrolled diabetes.  She is not on a SGLT2 inhibitor.  Last A1c 8.8%

## 2022-01-17 NOTE — Progress Notes (Signed)
Acute Office Visit  Subjective:     Patient ID: Deborah Alvarado, female    DOB: 30-Jul-1950, 71 y.o.   MRN: [id number]  Chief Complaint  Patient presents with   burning with urination    X a little over 2 weeks, pain in the pelvic area, urinary urgency, pressure, some incontinence     HPI Patient is in today for Urinary complaint  States that it has approx 2 weeks ago, States that it ihas increased.  Hx of them in the past. Is diabetic AZo baldder with out relief and patient also mentioned she has been eating some cranberry sauce.  Review of Systems  Constitutional:  Negative for chills and fever.  Gastrointestinal:  Positive for abdominal pain and nausea (baseline). Negative for vomiting.  Genitourinary:  Positive for dysuria, frequency and urgency. Negative for hematuria.  Musculoskeletal:  Positive for back pain.        Objective:    BP (!) 140/94   Pulse 67   Temp (!) 96.7 F (35.9 C) (Temporal)   Resp 12   Ht '5\' 4"'$  (1.626 m)   Wt 156 lb 6 oz (70.9 kg)   SpO2 100%   BMI 26.84 kg/m    Physical Exam Vitals and nursing note reviewed.  Constitutional:      Appearance: Normal appearance.  Cardiovascular:     Rate and Rhythm: Normal rate and regular rhythm.     Heart sounds: Normal heart sounds.  Pulmonary:     Effort: Pulmonary effort is normal.     Breath sounds: Normal breath sounds.  Abdominal:     General: Bowel sounds are normal.     Tenderness: There is abdominal tenderness. There is no right CVA tenderness or left CVA tenderness.  Neurological:     Mental Status: She is alert.     Results for orders placed or performed in visit on 01/17/22  POCT Urinalysis Dipstick (Automated)  Result Value Ref Range   Color, UA yellow    Clarity, UA cloudy    Glucose, UA Positive (A) Negative   Bilirubin, UA negative    Ketones, UA negative    Spec Grav, UA 1.025 1.010 - 1.025   Blood, UA positive-trace    pH, UA 5.0 5.0 - 8.0   Protein, UA Positive (A)  Negative   Urobilinogen, UA 0.2 0.2 or 1.0 E.U./dL   Nitrite, UA negative    Leukocytes, UA Large (3+) (A) Negative        Assessment & Plan:   Problem List Items Addressed This Visit       Genitourinary   Cystitis with hematuria    UA indicative of urinary tract infection.  We will treat patient with Macrobid 100 mg twice daily for 5 days.  Pending urinary culture.  Follow-up if no improvement.  Did offer to send in some Pyridium the patient politely declined.      Relevant Medications   nitrofurantoin, macrocrystal-monohydrate, (MACROBID) 100 MG capsule     Other   Dysuria - Primary    UA in office.      Relevant Orders   POCT Urinalysis Dipstick (Automated) (Completed)   Urine Culture   Glucosuria    Noted on UA.  Has been present for least the past 2 months.  Patient does have a history of uncontrolled diabetes.  She is not on a SGLT2 inhibitor.  Last A1c 8.8%       Meds ordered this encounter  Medications   nitrofurantoin,  macrocrystal-monohydrate, (MACROBID) 100 MG capsule    Sig: Take 1 capsule (100 mg total) by mouth 2 (two) times daily for 5 days.    Dispense:  10 capsule    Refill:  0    Order Specific Question:   Supervising Provider    Answer:   TOWER, MARNE A [[id number]]    Return if symptoms worsen or fail to improve.  Romilda Garret, NP

## 2022-01-17 NOTE — Assessment & Plan Note (Signed)
UA in office. 

## 2022-01-20 LAB — URINE CULTURE
MICRO NUMBER:: 14167533
SPECIMEN QUALITY:: ADEQUATE

## 2022-01-21 ENCOUNTER — Telehealth: Payer: Self-pay | Admitting: Nurse Practitioner

## 2022-01-21 MED ORDER — NITROFURANTOIN MONOHYD MACRO 100 MG PO CAPS: 100.0000 mg | ORAL_CAPSULE | Freq: Two times a day (BID) | ORAL | 0 refills | Status: DC

## 2022-01-21 NOTE — Telephone Encounter (Signed)
Patient advised.

## 2022-01-21 NOTE — Telephone Encounter (Signed)
Can extend it for two more days. Medication sent in

## 2022-01-21 NOTE — Telephone Encounter (Signed)
-----   Message from [place] sent at 01/21/2022 12:55 PM EST ----- Patient advised. Patient states her last dose of medication will be in the morning. Her burning improved but still not resolved and she still has the urge to urinate but not much comes out every time if at all. She states sometimes she has to have a longer course of medication, unless she needs to switch. Advised will call back once provider reviews

## 2022-01-29 ENCOUNTER — Ambulatory Visit (INDEPENDENT_AMBULATORY_CARE_PROVIDER_SITE_OTHER): Payer: Medicare Other

## 2022-01-29 VITALS — Ht 64.0 in | Wt 156.0 lb

## 2022-01-29 DIAGNOSIS — Z0000 Encounter for general adult medical examination without abnormal findings: Secondary | ICD-10-CM

## 2022-01-29 NOTE — Progress Notes (Signed)
Virtual Visit via Telephone Note  I connected with  Deborah Alvarado on 01/29/22 at  8:15 AM EST by telephone and verified that I am speaking with the correct person using two identifiers.  Location: Patient: home Provider: Kathleen Persons participating in the virtual visit: [place]   I discussed the limitations, risks, security and privacy concerns of performing an evaluation and management service by telephone and the availability of in person appointments. The patient expressed understanding and agreed to proceed.  Interactive audio and video telecommunications were attempted between this nurse and patient, however failed, due to patient having technical difficulties OR patient did not have access to video capability.  We continued and completed visit with audio only.  Some vital signs may be absent or patient reported.   Dionisio David, LPN  Subjective:   Deborah Alvarado is a 71 y.o. female who presents for Medicare Annual (Subsequent) preventive examination.  Review of Systems     Cardiac Risk Factors include: advanced age (>59mn, >>48women);dyslipidemia;hypertension     Objective:    There were no vitals filed for this visit. There is no height or weight on file to calculate BMI.     01/29/2022    8:17 AM 01/23/2021    1:26 PM  Advanced Directives  Does Patient Have a Medical Advance Directive? No Yes  Type of ACorporate treasure[place]f [place] will  Does patient want to make changes to medical advance directive?  Yes (MAU/Ambulatory/Procedural Areas - Information given)  Would patient like information on creating a medical advance directive? No - Patient declined     Current Medications (verified) Outpatient Encounter Medications as of 01/29/2022  Medication Sig   Cholecalciferol (VITAMIN D) 50 MCG (2000 UT) tablet Take 1 tablet (2,000 Units total) by mouth daily.   Dulaglutide (TRULICITY) 4.5 MCB/6.3AGSOPN Inject  4.5 mg as directed once a week.   glimepiride (AMARYL) 4 MG tablet Take 4 mg by mouth daily with breakfast.   losartan (COZAAR) 50 MG tablet TAKE 1 TABLET BY MOUTH DAILY   metFORMIN (GLUCOPHAGE) 500 MG tablet Take 1 tablet (500 mg total) by mouth 2 (two) times daily with a meal.   simvastatin (ZOCOR) 40 MG tablet Take 1 tablet (40 mg total) by mouth at bedtime.   nitrofurantoin, macrocrystal-monohydrate, (MACROBID) 100 MG capsule Take 1 capsule (100 mg total) by mouth 2 (two) times daily. (Patient not taking: Reported on 01/29/2022)   No facility-administered encounter medications on file as of 01/29/2022.    Allergies (verified) Lisinopril and Metformin and related   History: Past Medical History:  Diagnosis Date   Allergy    Anxiety    Diabetes mellitus    Type II   Hyperlipidemia    Hypertension    NSVD (normal spontaneous vaginal delivery)    x 2   Past Surgical History:  Procedure Laterality Date   TOTAL ABDOMINAL HYSTERECTOMY  01/2005   due to fibroids   Family History  Problem Relation Age of Onset   Cancer Mother        Leukemia   Diabetes Brother    Breast cancer Maternal Grandmother    Colon cancer Neg Hx    Social History   Socioeconomic History   Marital status: Married    Spouse name: Not on file   Number of children: 2   Years of education: Not on file   Highest education level: Not on file  Occupational History  Employer: [employer]  Tobacco Use   Smoking status: Never   Smokeless tobacco: Never  Substance and Sexual Activity   Alcohol use: No    Alcohol/week: 0.0 standard drinks of alcohol   Drug use: No   Sexual activity: Not on file  Other Topics Concern   Not on file  Social History Narrative   Grandchild died 2022-05-17   Worked at [employer], retired 2019   Social Determinants of [place] Strain: [place]  (01/29/2022)   Overall Financial Resource Strain (CARDIA)    Difficulty of Paying Living Expenses: Not hard  at all  Food Insecurity: No Food Insecurity (01/29/2022)   Hunger Vital Sign    Worried About Running Out of Food in the Last Year: Never true    [place] in the Last Year: Never true  Transportation Needs: No Transportation Needs (01/29/2022)   PRAPARE - Hydrologist (Medical): No    Lack of Transportation (Non-Medical): No  Physical Activity: Insufficiently Active (01/29/2022)   Exercise Vital Sign    Days of Exercise per Week: 3 days    Minutes of Exercise per Session: 30 min  Stress: No Stress Concern Present (01/29/2022)   [place]    Feeling of Stress : Not at all  Social Connections: Moderately Isolated (01/29/2022)   Social Connection and Isolation Panel [NHANES]    Frequency of Communication with Friends and Family: More than three times a week    Frequency of Social Gatherings with Friends and Family: Twice a week    Attends Religious Services: Never    Marine scientist or Organizations: No    Attends Music therapist: Never    Marital Status: Married    Tobacco Counseling Counseling given: Not Answered   Clinical Intake:  Pre-visit preparation completed: Yes  Pain : No/denies pain     Nutritional Risks: None Diabetes: Yes CBG done?: No Did pt. bring in CBG monitor from home?: No  How often do you need to have someone help you when you read instructions, pamphlets, or other written materials from your doctor or pharmacy?: 1 - Never  Diabetic?yes Nutrition Risk Assessment:  Has the patient had any N/V/D within the last 2 months?  Yes  Does the patient have any non-healing wounds?  No  Has the patient had any unintentional weight loss or weight gain?  No   Diabetes:  Is the patient diabetic?  Yes  If diabetic, was a CBG obtained today?  No  Did the patient bring in their glucometer from home?  No  How often do you monitor your  CBG's? never.   Financial Strains and Diabetes Management:  Are you having any financial strains with the device, your supplies or your medication? No .  Does the patient want to be seen by Chronic Care Management for management of their diabetes?  No  Would the patient like to be referred to a Nutritionist or for Diabetic Management?  No   Diabetic Exams:  Diabetic Eye Exam: Completed 11/07/21. Marland Kitchen Pt has been advised about the importance in completing this exam.   Diabetic Foot Exam: Completed 03/20/21. Pt has been advised about the importance in completing this exam.    Interpreter Needed?: No  Information entered by :: Kirke Shaggy, LPN   Activities of Daily Living    01/29/2022    8:18 AM  In your  present state of health, do you have any difficulty performing the following activities:  Hearing? 0  Vision? 0  Difficulty concentrating or making decisions? 0  Walking or climbing stairs? 1  Dressing or bathing? 0  Doing errands, shopping? 0  Preparing Food and eating ? N  Using the Toilet? N  In the past six months, have you accidently leaked urine? N  Do you have problems with loss of bowel control? N  Managing your Medications? N  Managing your Finances? N  Housekeeping or managing your Housekeeping? N    Patient Care Team: Tonia Ghent, MD as PCP - General Lonia Farber, MD as Consulting Physician (Internal Medicine) Eulogio Bear, MD as Consulting Physician (Ophthalmology) Charlton Haws, [facility] as Pharmacist (Pharmacist)  Indicate any recent Medical Services you may have received from other than [facility] providers in the past year (date may be approximate).     Assessment:   This is a routine wellness examination for Deborah Alvarado.  Hearing/Vision screen Hearing Screening - Comments:: No aids Vision Screening - Comments:: Wears glasses-Dr.King  Dietary issues and exercise activities discussed: Current Exercise Habits: Home exercise routine, Type of  exercise: walking, Time (Minutes): 30, Frequency (Times/Week): 3, Weekly Exercise (Minutes/Week): 90, Intensity: Mild   Goals Addressed             This Visit's Progress    DIET - EAT MORE FRUITS AND VEGETABLES         Depression Screen    01/29/2022    8:14 AM 01/23/2021    1:28 PM 02/28/2020    2:31 PM 12/29/2017   11:05 AM 02/07/2017   12:01 PM 02/07/2017   11:59 AM 02/05/2016   10:58 AM  PHQ 2/9 Scores  PHQ - 2 Score 0 0 0 0 1 1 0  PHQ- 9 Score 0          Fall Risk    01/29/2022    8:17 AM 01/23/2021    1:28 PM 02/28/2020    2:31 PM 12/29/2017   11:05 AM  [place] in the past year? 0 0 0 No  Number falls in past yr: 0 0 0   Injury with Fall? 0 0 0   Risk for fall due to : No Fall Risks No Fall Risks    Follow up Falls prevention discussed;Falls evaluation completed Falls prevention discussed Falls evaluation completed     FALL RISK PREVENTION PERTAINING TO THE HOME:  Any stairs in or around the home? Yes  If so, are there any without handrails? No  Home free of loose throw rugs in walkways, pet beds, electrical cords, etc? Yes  Adequate lighting in your home to reduce risk of falls? Yes   ASSISTIVE DEVICES UTILIZED TO PREVENT FALLS:  Life alert? No  Use of a cane, walker or w/c? No  Grab bars in the bathroom? No  Shower chair or bench in shower? Yes  Elevated toilet seat or a handicapped toilet? Yes    Cognitive Function:        01/29/2022    8:18 AM  6CIT Screen  What Year? 0 points  What month? 0 points  What time? 0 points  Count back from 20 0 points  Months in reverse 0 points  Repeat phrase 0 points  Total Score 0 points    Immunizations Immunization History  Administered Date(s) Administered   Hep A / Hep B 07/31/2012, 08/31/2012, 01/21/2013   Influenza Whole 12/23/2008, 12/15/2009  Influenza, High Dose Seasonal PF 01/03/2019   Influenza,inj,Quad PF,6+ Mos 11/25/2012, 01/08/2015, 12/29/2017   Influenza-Unspecified  12/06/2013, 12/25/2015, 12/23/2016, 01/10/2020, 11/23/2020   PFIZER Comirnaty(Gray Top)Covid-19 Tri-Sucrose Vaccine 01/17/2020   PFIZER(Purple Top)SARS-COV-2 Vaccination 04/12/2019, 05/09/2019, 01/17/2020   Pfizer Covid-19 Vaccine Bivalent Booster 100yr & up 01/21/2021   Pneumococcal Conjugate-13 12/29/2017, 01/05/2019   Pneumococcal Polysaccharide-23 12/25/2010   Td 04/11/2005   Tdap 02/06/2017   Zoster Recombinat (Shingrix) 01/28/2019, 04/05/2019   Zoster, Live 07/31/2012    TDAP status: Up to date  Flu Vaccine status: Due, Education has been provided regarding the importance of this vaccine. Advised may receive this vaccine at local pharmacy or Health Dept. Aware to provide a copy of the vaccination record if obtained from local pharmacy or Health Dept. Verbalized acceptance and understanding.  Pneumococcal vaccine status: Up to date  Covid-19 vaccine status: Completed vaccines  Qualifies for Shingles Vaccine? Yes   Zostavax completed Yes   Shingrix Completed?: Yes  Screening Tests Health Maintenance  Topic Date Due   Pneumonia Vaccine 69 Years old (3 - PPSV23 or PCV20) 01/05/2020   Diabetic kidney evaluation - Urine ACR  08/17/2021   INFLUENZA VACCINE  10/09/2021   COVID-19 Vaccine (6 - 2023-24 season) 11/09/2021   FOOT EXAM  03/20/2022   HEMOGLOBIN A1C  06/14/2022   OPHTHALMOLOGY EXAM  11/08/2022   Diabetic kidney evaluation - GFR measurement  12/14/2022   Medicare Annual Wellness (AWV)  01/30/2023   MAMMOGRAM  03/08/2023   COLONOSCOPY (Pts 45-481yrInsurance coverage will need to be confirmed)  11/06/2023   DEXA SCAN  Completed   Hepatitis C Screening  Completed   Zoster Vaccines- Shingrix  Completed   HPV VACCINES  Aged Out    Health Maintenance  Health Maintenance Due  Topic Date Due   Pneumonia Vaccine [age]Years old (3 49 PPSV23 or PCV20) 01/05/2020   Diabetic kidney evaluation - Urine ACR  08/17/2021   INFLUENZA VACCINE  10/09/2021   COVID-19 Vaccine (6 -  2023-24 season) 11/09/2021    Colorectal cancer screening: Type of screening: Colonoscopy. Completed 11/06/18. Repeat every 5 years  Mammogram status: Completed 03/16/21. Repeat every year  Bone Density status: Completed 03/07/21. Results reflect: Bone density results: OSTEOPENIA. Repeat every 5 years.  Lung Cancer Screening: (Low Dose CT Chest recommended if Age 533-80ears, 30 pack-year currently smoking OR have quit w/in 15years.) does not qualify.    Additional Screening:  Hepatitis C Screening: does qualify; Completed 12/29/17  Vision Screening: Recommended annual ophthalmology exams for early detection of glaucoma and other disorders of the eye. Is the patient up to date with their annual eye exam?  Yes  Who is the provider or what is the name of the office in which the patient attends annual eye exams? Dr.King If pt is not established with a provider, would they like to be referred to a provider to establish care? No .   Dental Screening: Recommended annual dental exams for proper oral hygiene  Community Resource Referral / Chronic Care Management: CRR required this visit?  No   CCM required this visit?  No      Plan:     I have personally reviewed and noted the following in the patient's chart:   Medical and social history Use of alcohol, tobacco or illicit drugs  Current medications and supplements including opioid prescriptions. Patient is not currently taking opioid prescriptions. Functional ability and status Nutritional status Physical activity Advanced directives List of other physicians Hospitalizations, surgeries, and  ER visits in previous 12 months Vitals Screenings to include cognitive, depression, and falls Referrals and appointments  In addition, I have reviewed and discussed with patient certain preventive protocols, quality metrics, and best practice recommendations. A written personalized care plan for preventive services as well as general  preventive health recommendations were provided to patient.     Dionisio David, LPN   01/05/2535   Nurse Notes: none

## 2022-01-29 NOTE — Patient Instructions (Signed)
Deborah Alvarado , Thank you for taking time to come for your Medicare Wellness Visit. I appreciate your ongoing commitment to your health goals. Please review the following plan we discussed and let me know if I can assist you in the future.   Screening recommendations/referrals: Colonoscopy: 11/06/18 Mammogram: 03/16/21 Bone Density: 03/07/21 Recommended yearly ophthalmology/optometry visit for glaucoma screening and checkup Recommended yearly dental visit for hygiene and checkup  Vaccinations: Influenza vaccine: 11/23/20 Pneumococcal vaccine: 01/05/19 Tdap vaccine: 02/06/17 Shingles vaccine: Zostavax 07/31/12  Shingrix 01/28/19, 04/05/19   Covid-19:04/12/19, 05/09/19, 01/17/20, 01/21/21  Advanced directives: no  Conditions/risks identified: none  Next appointment: Follow up in one year for your annual wellness visit 01/31/23 @ 8:15 am by phone   Preventive Care 65 Years and Older, Female Preventive care refers to lifestyle choices and visits with your health care provider that can promote health and wellness. What does preventive care include? A yearly physical exam. This is also called an annual well check. Dental exams once or twice a year. Routine eye exams. Ask your health care provider how often you should have your eyes checked. Personal lifestyle choices, including: Daily care of your teeth and gums. Regular physical activity. Eating a healthy diet. Avoiding tobacco and drug use. Limiting alcohol use. Practicing safe sex. Taking low-dose aspirin every day. Taking vitamin and mineral supplements as recommended by your health care provider. What happens during an annual well check? The services and screenings done by your health care provider during your annual well check will depend on your age, overall health, lifestyle risk factors, and family history of disease. Counseling  Your health care provider may ask you questions about your: Alcohol use. Tobacco use. Drug  use. Emotional well-being. Home and relationship well-being. Sexual activity. Eating habits. History of falls. Memory and ability to understand (cognition). Work and work Statistician. Reproductive health. Screening  You may have the following tests or measurements: Height, weight, and BMI. Blood pressure. Lipid and cholesterol levels. These may be checked every 5 years, or more frequently if you are over 80 years old. Skin check. Lung cancer screening. You may have this screening every year starting at age 37 if you have a 30-pack-year history of smoking and currently smoke or have quit within the past 15 years. Fecal occult blood test (FOBT) of the stool. You may have this test every year starting at age 57. Flexible sigmoidoscopy or colonoscopy. You may have a sigmoidoscopy every 5 years or a colonoscopy every 10 years starting at age 37. Hepatitis C blood test. Hepatitis B blood test. Sexually transmitted disease (STD) testing. Diabetes screening. This is done by checking your blood sugar (glucose) after you have not eaten for a while (fasting). You may have this done every 1-3 years. Bone density scan. This is done to screen for osteoporosis. You may have this done starting at age 58. Mammogram. This may be done every 1-2 years. Talk to your health care provider about how often you should have regular mammograms. Talk with your health care provider about your test results, treatment options, and if necessary, the need for more tests. Vaccines  Your health care provider may recommend certain vaccines, such as: Influenza vaccine. This is recommended every year. Tetanus, diphtheria, and acellular pertussis (Tdap, Td) vaccine. You may need a Td booster every 10 years. Zoster vaccine. You may need this after age 53. Pneumococcal 13-valent conjugate (PCV13) vaccine. One dose is recommended after age 67. Pneumococcal polysaccharide (PPSV23) vaccine. One dose is recommended after age  65. Talk to your health care provider about which screenings and vaccines you need and how often you need them. This information is not intended to replace advice given to you by your health care provider. Make sure you discuss any questions you have with your health care provider. Document Released: 03/24/2015 Document Revised: 11/15/2015 Document Reviewed: 12/27/2014 Elsevier Interactive Patient Education  2017 [place] Prevention in the Home Falls can cause injuries. They can happen to people of all ages. There are many things you can do to make your home safe and to help prevent falls. What can I do on the outside of my home? Regularly fix the edges of walkways and driveways and fix any cracks. Remove anything that might make you trip as you walk through a door, such as a raised step or threshold. Trim any bushes or trees on the path to your home. Use bright outdoor lighting. Clear any walking paths of anything that might make someone trip, such as rocks or tools. Regularly check to see if handrails are loose or broken. Make sure that both sides of any steps have handrails. Any raised decks and porches should have guardrails on the edges. Have any leaves, snow, or ice cleared regularly. Use sand or salt on walking paths during winter. Clean up any spills in your garage right away. This includes oil or grease spills. What can I do in the bathroom? Use night lights. Install grab bars by the toilet and in the tub and shower. Do not use towel bars as grab bars. Use non-skid mats or decals in the tub or shower. If you need to sit down in the shower, use a plastic, non-slip stool. Keep the floor dry. Clean up any water that spills on the floor as soon as it happens. Remove soap buildup in the tub or shower regularly. Attach bath mats securely with double-sided non-slip rug tape. Do not have throw rugs and other things on the floor that can make you trip. What can I do in the  bedroom? Use night lights. Make sure that you have a light by your bed that is easy to reach. Do not use any sheets or blankets that are too big for your bed. They should not hang down onto the floor. Have a firm chair that has side arms. You can use this for support while you get dressed. Do not have throw rugs and other things on the floor that can make you trip. What can I do in the kitchen? Clean up any spills right away. Avoid walking on wet floors. Keep items that you use a lot in easy-to-reach places. If you need to reach something above you, use a strong step stool that has a grab bar. Keep electrical cords out of the way. Do not use floor polish or wax that makes floors slippery. If you must use wax, use non-skid floor wax. Do not have throw rugs and other things on the floor that can make you trip. What can I do with my stairs? Do not leave any items on the stairs. Make sure that there are handrails on both sides of the stairs and use them. Fix handrails that are broken or loose. Make sure that handrails are as long as the stairways. Check any carpeting to make sure that it is firmly attached to the stairs. Fix any carpet that is loose or worn. Avoid having throw rugs at the top or bottom of the stairs. If you do have throw  rugs, attach them to the floor with carpet tape. Make sure that you have a light switch at the top of the stairs and the bottom of the stairs. If you do not have them, ask someone to add them for you. What else can I do to help prevent falls? Wear shoes that: Do not have high heels. Have rubber bottoms. Are comfortable and fit you well. Are closed at the toe. Do not wear sandals. If you use a stepladder: Make sure that it is fully opened. Do not climb a closed stepladder. Make sure that both sides of the stepladder are locked into place. Ask someone to hold it for you, if possible. Clearly mark and make sure that you can see: Any grab bars or  handrails. First and last steps. Where the edge of each step is. Use tools that help you move around (mobility aids) if they are needed. These include: Canes. Walkers. Scooters. Crutches. Turn on the lights when you go into a dark area. Replace any light bulbs as soon as they burn out. Set up your furniture so you have a clear path. Avoid moving your furniture around. If any of your floors are uneven, fix them. If there are any pets around you, be aware of where they are. Review your medicines with your doctor. Some medicines can make you feel dizzy. This can increase your chance of falling. Ask your doctor what other things that you can do to help prevent falls. This information is not intended to replace advice given to you by your health care provider. Make sure you discuss any questions you have with your health care provider. Document Released: 12/22/2008 Document Revised: 08/03/2015 Document Reviewed: 04/01/2014 Elsevier Interactive Patient Education  2017 [employer].

## 2022-01-30 ENCOUNTER — Other Ambulatory Visit: Payer: Self-pay | Admitting: Family Medicine

## 2022-01-31 ENCOUNTER — Other Ambulatory Visit: Payer: Self-pay | Admitting: Family Medicine

## 2022-02-04 ENCOUNTER — Other Ambulatory Visit: Payer: Self-pay | Admitting: Family Medicine

## 2022-02-04 DIAGNOSIS — Z1231 Encounter for screening mammogram for malignant neoplasm of breast: Secondary | ICD-10-CM

## 2022-02-12 NOTE — Progress Notes (Signed)
[facility] Quality Team Note  Name: Deborah Alvarado Date of Birth: 1950-07-03 MRN: [id number] Date: 02/12/2022  [facility] Quality Team has reviewed this patient's chart, please see recommendations below:  [facility] Quality Other; Pt has open quality gap for KED, needs Micro/Creat Urine test to close this. Please address at next visit.

## 2022-02-15 ENCOUNTER — Ambulatory Visit (INDEPENDENT_AMBULATORY_CARE_PROVIDER_SITE_OTHER): Payer: Medicare Other | Admitting: Family Medicine

## 2022-02-15 ENCOUNTER — Encounter: Payer: Self-pay | Admitting: Family Medicine

## 2022-02-15 VITALS — BP 120/82 | HR 65 | Temp 97.3°F | Ht 64.0 in | Wt 156.0 lb

## 2022-02-15 DIAGNOSIS — J019 Acute sinusitis, unspecified: Secondary | ICD-10-CM

## 2022-02-15 DIAGNOSIS — R300 Dysuria: Secondary | ICD-10-CM

## 2022-02-15 DIAGNOSIS — E1165 Type 2 diabetes mellitus with hyperglycemia: Secondary | ICD-10-CM

## 2022-02-15 DIAGNOSIS — E119 Type 2 diabetes mellitus without complications: Secondary | ICD-10-CM

## 2022-02-15 LAB — POC URINALSYSI DIPSTICK (AUTOMATED)
Bilirubin, UA: NEGATIVE
Blood, UA: 25
Glucose, UA: NEGATIVE
Ketones, UA: NEGATIVE
Nitrite, UA: NEGATIVE
Protein, UA: POSITIVE — AB
Spec Grav, UA: 1.025 (ref 1.010–1.025)
Urobilinogen, UA: 0.2 E.U./dL
pH, UA: 5 (ref 5.0–8.0)

## 2022-02-15 LAB — MICROALBUMIN / CREATININE URINE RATIO
Creatinine,U: 122.9 mg/dL
Microalb Creat Ratio: 16.2 mg/g (ref 0.0–30.0)
Microalb, Ur: 19.9 mg/dL — ABNORMAL HIGH (ref 0.0–1.9)

## 2022-02-15 LAB — HEMOGLOBIN A1C: Hgb A1c MFr Bld: 9.3 % — ABNORMAL HIGH (ref 4.6–6.5)

## 2022-02-15 MED ORDER — AMOXICILLIN-POT CLAVULANATE 875-125 MG PO TABS: 1.0000 | ORAL_TABLET | Freq: Two times a day (BID) | ORAL | 0 refills | Status: DC

## 2022-02-15 NOTE — Patient Instructions (Signed)
Go to the lab on the way out.   If you have mychart we'll likely use that to update you.    Take care.  Glad to see you. Start augmentin.  Flu shot when possible, then covid vaccine.   We'll see about follow up after I see your labs.

## 2022-02-15 NOTE — Progress Notes (Unsigned)
dysuria: yes duration of symptoms: a few weeks.   abdominal pain: occ, not consistent.  fevers: no back pain: R lower back pain vomiting: no  duration of symptoms: started about 1 week rhinorrhea: yes congestion: yes ear pain: R ear itching but not painful sore throat: irritated throat cough: no myalgias: no  Diabetes:  Using medications without difficulties: yes Hypoglycemic episodes: no sx Hyperglycemic episodes: no sx Feet problems: some burning, variable.   Blood Sugars averaging: not checked.   eye exam within last year:yes  Per HPI unless specifically indicated in ROS section  Meds, vitals, and allergies reviewed.   GEN: nad, alert and oriented HEENT: mucous membranes moist, TM w/o erythema, nasal epithelium injected, OP with cobblestoning, sinuses ttp x4.   NECK: supple w/o LA CV: rrr. PULM: ctab, no inc wob ABD: soft, +bs, suprapubic area ttp w/o rebound.  EXT: no edema

## 2022-02-17 LAB — URINE CULTURE
MICRO NUMBER:: 14290205
SPECIMEN QUALITY:: ADEQUATE

## 2022-02-17 NOTE — Assessment & Plan Note (Signed)
See notes on labs.  Continue glimepiride and Trulicity and metformin for now.

## 2022-02-17 NOTE — Assessment & Plan Note (Signed)
Okay for outpatient follow-up.  Start Augmentin.  Rest and fluids.  Supportive care.  See after visit summary.

## 2022-02-17 NOTE — Assessment & Plan Note (Signed)
Presumed cystitis.  See notes on labs.  Will be starting Augmentin for sinusitis which should cover most typical bacteria.

## 2022-02-22 ENCOUNTER — Telehealth: Payer: Self-pay

## 2022-02-22 NOTE — Progress Notes (Signed)
    Chronic Care Management Pharmacy Assistant   Name: Deborah Alvarado  MRN: [id number] DOB: 10/05/1950  Reason for Encounter: CCM (Appointment Reminder)  Medications: Outpatient Encounter Medications as of 02/22/2022  Medication Sig   amoxicillin-clavulanate (AUGMENTIN) 875-125 MG tablet Take 1 tablet by mouth 2 (two) times daily.   Cholecalciferol (VITAMIN D) 50 MCG (2000 UT) tablet Take 1 tablet (2,000 Units total) by mouth daily.   Dulaglutide (TRULICITY) 4.5 VQ/0.0QQ SOPN Inject 4.5 mg as directed once a week.   glimepiride (AMARYL) 4 MG tablet Take 4 mg by mouth daily with breakfast.   losartan (COZAAR) 50 MG tablet TAKE 1 TABLET BY MOUTH DAILY   metFORMIN (GLUCOPHAGE) 500 MG tablet Take 1 tablet (500 mg total) by mouth 2 (two) times daily with a meal.   simvastatin (ZOCOR) 40 MG tablet TAKE 1 TABLET BY MOUTH AT  BEDTIME   No facility-administered encounter medications on file as of 02/22/2022.   [name] was contacted to remind of upcoming telephone visit with Charlene Brooke on 02/27/2022 at 10:00. Patient was reminded to have any blood glucose and blood pressure readings available for review at appointment.   Message was left reminding patient of appointment.  CCM referral has been placed prior to visit?  Yes   Star Rating Drugs: Medication:  Last Fill: Day Supply Trulicity 4.5 mg Stopped Glimepiride 4 mg 11/26/2021 100 Losartan 40 mg 02/04/2022 100 Metformin 500 mg 02/07/2022 100 Simvastatin 40 mg 02/04/2022 [place], CPP notified  Marijean Niemann, [place] Clinical Pharmacy Assistant [phone]

## 2022-02-27 ENCOUNTER — Telehealth: Payer: Medicare Other

## 2022-02-27 ENCOUNTER — Telehealth: Payer: Self-pay

## 2022-02-27 ENCOUNTER — Ambulatory Visit: Payer: Medicare Other | Admitting: Pharmacist

## 2022-02-27 DIAGNOSIS — E1165 Type 2 diabetes mellitus with hyperglycemia: Secondary | ICD-10-CM

## 2022-02-27 DIAGNOSIS — I10 Essential (primary) hypertension: Secondary | ICD-10-CM

## 2022-02-27 DIAGNOSIS — M8580 Other specified disorders of bone density and structure, unspecified site: Secondary | ICD-10-CM

## 2022-02-27 DIAGNOSIS — E785 Hyperlipidemia, unspecified: Secondary | ICD-10-CM

## 2022-02-27 NOTE — Progress Notes (Addendum)
Patient is currently enrolled in [employer] patient assistance program for the medication Trulicity until date: 03/10/22 Patient would like to re-enroll in patient assistance for the next calendar year.  Renewal forms were completed on behalf of the patient. Patient has requested to sign forms in the office. Forms will be printed and placed at front desk.  Lindsey Foltanski, CPP notified  Graydon Fofana, RMA Clinical Pharmacy Assistant [phone]   

## 2022-02-27 NOTE — Progress Notes (Signed)
Chronic Care Management Pharmacy Note  02/27/2022 Name:  Deborah Alvarado MRN:  [id number] DOB:  June 19, 1950  Summary: CCM F/U visit -Discussed DM: A1c 9.3% (02/2022), worsened from 8.8%; pt declined insulin and is working on diet/exercise -Future DM options: she would benefit from basal insulin but refuses and wants to put off insulin as long as possible; other DM options include Actos or switching Trulicity to Ozempic/Mounjaro   Recommendations/Changes made from today's visit: -No med changes -Renew Trulicity PAP for 2202  Plan: -[place] will call patient 1 month for DM update -Pharmacist follow up televisit scheduled for 2 months -PCP appt 05/23/22    Subjective: Deborah Alvarado is an 71 y.o. year old female who is a primary patient of [name], Elveria Rising, MD.  The CCM team was consulted for assistance with disease management and care coordination needs.    Engaged with patient by telephone for follow up visit in response to provider referral for pharmacy case management and/or care coordination services.   Consent to Services:  The patient was given information about Chronic Care Management services, agreed to services, and gave verbal consent prior to initiation of services.  Please see initial visit note for detailed documentation.   Patient Care Team: Tonia Ghent, MD as PCP - General Lonia Farber, MD as Consulting Physician (Internal Medicine) Eulogio Bear, MD as Consulting Physician (Ophthalmology) Charlton Haws, [facility] as Pharmacist (Pharmacist)  Recent office visits: 02/15/22 Dr [name] OV: DM - A1c 9.3%. Pt declined insulin, will work on diet. F/u 3 months.  01/17/22 NP Matt Cable: UTI. Rx Macrobid.  04/26/21 NP Tabitha Dugal OV: vaginal candida, strep. Rx'd Amoxicillin and Fluconazole.  04/13/21 NP Tabitha Dugal OV: oral thrush, sinusitis. Rx'd Amoxicillin, Nystatin rinse.  03/20/21 Dr [name] OV: Annual - reduced metformin to 500  mg  Recent consult visits: 05/17/21 Dr Ladell Pier (Endocrine): f/u DM. Pt reports A1c 6.3 per home health recently, unclear how accurate this is given A1c 8.8% in January and no changes since then. Advised to check sugar BID and call office in a week. F/U 3-4 months.  Hospital visits: None in previous 6 months   Objective:  Lab Results  Component Value Date   CREATININE 0.88 12/13/2021   BUN 12 12/13/2021   GFR 67.07 12/13/2021   GFRNONAA 68.49 01/02/2010   GFRAA 112 07/16/2007   NA 138 12/13/2021   K 4.4 12/13/2021   CALCIUM 9.7 12/13/2021   CO2 25 12/13/2021   GLUCOSE 191 (H) 12/13/2021    Lab Results  Component Value Date/Time   HGBA1C 9.3 (H) 02/15/2022 09:54 AM   HGBA1C 8.8 (H) 12/13/2021 11:05 AM   HGBA1C 9.8 11/11/2019 12:00 AM   GFR 67.07 12/13/2021 11:05 AM   GFR 74.47 03/20/2021 11:44 AM   MICROALBUR 19.9 (H) 02/15/2022 11:52 AM   MICROALBUR 1.0 10/05/2008 09:54 AM    Last diabetic Eye exam:  Lab Results  Component Value Date/Time   HMDIABEYEEXA No Retinopathy 11/07/2021 12:00 AM    Last diabetic Foot exam:  Lab Results  Component Value Date/Time   HMDIABFOOTEX yes 12/15/2009 12:00 AM     Lab Results  Component Value Date   CHOL 141 12/13/2021   HDL 45.00 12/13/2021   LDLCALC 59 12/13/2021   LDLDIRECT 84.0 03/20/2021   TRIG 184.0 (H) 12/13/2021   CHOLHDL 3 12/13/2021       Latest Ref Rng & Units 12/13/2021   11:05 AM 03/20/2021   11:44 AM 12/29/2017  11:52 AM  Hepatic Function  Total Protein 6.0 - 8.3 g/dL 6.6  7.0  7.2   Albumin 3.5 - 5.2 g/dL 4.4  4.5  4.5   AST 0 - 37 U/L _0 ALT 0 - 35 U/L 25  32  30   Alk Phosphatase 39 - 117 U/L 88  78  96   Total Bilirubin 0.2 - 1.2 mg/dL 0.6  0.5  0.6     Lab Results  Component Value Date/Time   TSH 2.91 10/05/2008 09:54 AM   TSH 2.10 07/16/2007 09:40 AM       Latest Ref Rng & Units 10/05/2008    9:54 AM  CBC  WBC 4.5 - 10.5 10*3/microliter 5.2   Hemoglobin 12.0 - 15.0 g/dL 13.8    Hematocrit 36.0 - 46.0 % 40.6   Platelets 150.0 - 400.0 K/uL 220.0     Lab Results  Component Value Date/Time   VD25OH 48.29 12/13/2021 11:05 AM   VD25OH 45.56 03/20/2021 11:44 AM    Clinical ASCVD: No  The 10-year ASCVD risk score (Arnett DK, et al., 2019) is: 17.3%   Values used to calculate the score:     Age: 71 years     Sex: Female     Is Non-Hispanic African American: No     Diabetic: Yes     Tobacco smoker: No     Systolic Blood Pressure: 016 mmHg     Is BP treated: Yes     HDL Cholesterol: 45 mg/dL     Total Cholesterol: 141 mg/dL       01/29/2022    8:14 AM 01/23/2021    1:28 PM 02/28/2020    2:31 PM  Depression screen PHQ 2/9  Decreased Interest 0 0 0  Down, Depressed, Hopeless 0 0 0  PHQ - 2 Score 0 0 0  Altered sleeping 0    Tired, decreased energy 0    Change in appetite 0    Feeling bad or failure about yourself  0    Trouble concentrating 0    Moving slowly or fidgety/restless 0    Suicidal thoughts 0    PHQ-9 Score 0    Difficult doing work/chores Not difficult at all       Social History   Tobacco Use  Smoking Status Never  Smokeless Tobacco Never   BP Readings from Last 3 Encounters:  02/15/22 120/82  01/17/22 (!) 140/94  12/03/21 (!) 130/90   Pulse Readings from Last 3 Encounters:  02/15/22 65  01/17/22 67  12/03/21 91   Wt Readings from Last 3 Encounters:  02/15/22 156 lb (70.8 kg)  01/29/22 156 lb (70.8 kg)  01/17/22 156 lb 6 oz (70.9 kg)   BMI Readings from Last 3 Encounters:  02/15/22 26.78 kg/m  01/29/22 26.78 kg/m  01/17/22 26.84 kg/m    Assessment/Interventions: Review of patient past medical history, allergies, medications, health status, including review of consultants reports, laboratory and other test data, was performed as part of comprehensive evaluation and provision of chronic care management services.   SDOH:  (Social Determinants of Health) assessments and interventions performed: No - done Nov 2023 @  [facility] SDOH Interventions    Flowsheet Row Clinical Support from 01/29/2022 in [place] at [place] Management from 04/02/2021 in [place] at [place] from 02/07/2021 in [place] Interventions     Food Insecurity Interventions Intervention Not  Indicated -- --  Housing Interventions Intervention Not Indicated Intervention Not Indicated --  Transportation Interventions Intervention Not Indicated -- --  Utilities Interventions Intervention Not Indicated -- --  Alcohol Usage Interventions Intervention Not Indicated (Score <7) -- --  Financial Strain Interventions Intervention Not Indicated Other (Comment)  [Patient assistance] Other (Comment)  [patient refered to Pharmacy consult no other financial needs in the donut hole presently]  Physical Activity Interventions Intervention Not Indicated -- --  Stress Interventions Intervention Not Indicated -- --  Social Connections Interventions Intervention Not Indicated -- --      SDOH Screenings   Food Insecurity: No Food Insecurity (01/29/2022)  Housing: Low Risk  (01/29/2022)  Transportation Needs: No Transportation Needs (01/29/2022)  Utilities: Not At Risk (01/29/2022)  Alcohol Screen: Low Risk  (01/29/2022)  Depression (PHQ2-9): Low Risk  (01/29/2022)  Financial Resource Strain: Low Risk  (01/29/2022)  Physical Activity: Insufficiently Active (01/29/2022)  Social Connections: Moderately Isolated (01/29/2022)  Stress: No Stress Concern Present (01/29/2022)  Tobacco Use: Low Risk  (02/15/2022)    CCM Care Plan  Allergies  Allergen Reactions   Lisinopril     Cough, presumed   Metformin And Related Other (See Comments)    Diarrhea at >1031m a day    Medications Reviewed Today     Reviewed by [facility]Charlton Haws [facility](Pharmacist) on 02/27/22 at 1029  Med List Status: <None>   Medication Order Taking? Sig Documenting Provider Last Dose  Status Informant  Cholecalciferol (VITAMIN D) 50 MCG (2000 UT) tablet [id number] Take 1 tablet (2,000 Units total) by mouth daily. DTonia Ghent MD Taking Active   Dulaglutide (TRULICITY) 4.5 MSP/2.3RASBonney [id number] Inject 4.5 mg as directed once a week. DTonia Ghent MD Taking Active   glimepiride (AMARYL) 4 MG tablet [id number] Take 4 mg by mouth daily with breakfast. OLonia Farber MD Taking Active   losartan (COZAAR) 50 MG tablet [id number] TAKE 1 TABLET BY MOUTH DAILY DTonia Ghent MD Taking Active   metFORMIN (GLUCOPHAGE) 500 MG tablet [id number] Take 1 tablet (500 mg total) by mouth 2 (two) times daily with a meal. DTonia Ghent MD Taking Active   simvastatin (ZOCOR) 40 MG tablet [id number] TAKE 1 TABLET BY MOUTH AT  BEDTIME DTonia Ghent MD Taking Active             Patient Active Problem List   Diagnosis Date Noted   Cystitis with hematuria 01/17/2022   Glucosuria 01/17/2022   Dysphagia 11/18/2021   Dysuria 03/21/2021   Tremor of left hand 03/21/2021   Knee pain 03/21/2021   Healthcare maintenance 03/01/2020   Osteopenia 04/15/2018   Sinus tachycardia 12/31/2017   Cervical radiculitis 09/23/2017   Advance care planning 02/07/2017   Welcome to Medicare preventive visit 03/29/2011   Transaminitis 03/29/2011   Hyperlipidemia 07/13/2010   Acute non-recurrent sinusitis 04/09/2010   PLANTAR FASCIITIS, LEFT 01/20/2008   Diabetes mellitus without complication ([place] 084/53/6468  ANXIETY 07/31/2006   Essential hypertension 07/31/2006   ALLERGIC RHINITIS 07/31/2006   ACNE ROSACEA 04/11/2005    Immunization History  Administered Date(s) Administered   Hep A / Hep B 07/31/2012, 08/31/2012, 01/21/2013   Influenza Whole 12/23/2008, 12/15/2009   Influenza, High Dose Seasonal PF 01/03/2019   Influenza,inj,Quad PF,6+ Mos 11/25/2012, 01/08/2015, 12/29/2017   Influenza-Unspecified 12/06/2013, 12/25/2015, 12/23/2016, 01/10/2020, 11/23/2020    PFIZER Comirnaty(Gray Top)Covid-19 Tri-Sucrose Vaccine 01/17/2020   PFIZER(Purple Top)SARS-COV-2 Vaccination 04/12/2019, 05/09/2019, 01/17/2020   Pfizer Covid-19 Vaccine  Bivalent Booster 41yr & up 01/21/2021   Pneumococcal Conjugate-13 12/29/2017, 01/05/2019   Pneumococcal Polysaccharide-23 12/25/2010   Td 04/11/2005   Tdap 02/06/2017   Zoster Recombinat (Shingrix) 01/28/2019, 04/05/2019   Zoster, Live 07/31/2012    Conditions to be addressed/monitored:  Hypertension, Hyperlipidemia, Diabetes, and Osteopenia  Care Plan : General Pharmacy (Adult)  Updates made by FCharlton Haws RByarssince 02/27/2022 12:00 AM     Problem: Hypertension, Hyperlipidemia, Diabetes, and Osteopenia   Priority: High     Long-Range Goal: Disease mgmt   Start Date: 04/02/2021  Expected End Date: 07/09/2022  This Visit's Progress: Not on track  Recent Progress: On track  Priority: High  Note:   Current Barriers:  Unable to achieve control of diabetes   Pharmacist Clinical Goal(s):  Patient will contact provider office for questions/concerns as evidenced notation of same in electronic health record through collaboration with PharmD and provider.   Interventions: 1:1 collaboration with DTonia Ghent MD regarding development and update of comprehensive plan of care as evidenced by provider attestation and co-signature Inter-disciplinary care team collaboration (see longitudinal plan of care) Comprehensive medication review performed; medication list updated in electronic medical record  Hypertension (BP goal <130/80) -Controlled - per home BP readings; she has taken losartan since 2012, tolerates well. -Current home readings: 117/80  -Current treatment: Losartan 50 mg daily - Appropriate, Effective, Safe, Accessible -Medications previously tried: lisinopril (cough) -Current exercise habits: Stopped going to gym in 2020 after COVID restrictions. She gets a free membership with insurance and  she would like to get back into working out. She has 3 acres which keeps her active in the Spring/Summer. We discussed importance of daily exercise, starting with 10-15 minute increments for heart health. -Recommended to continue current medication  Hyperlipidemia: (LDL goal < 70) -Not ideally controlled - LDL 84 (03/2021). LDL goal < 70 per ADA 2023 recommendations for patients with diabetes, age 69-75at increased cardiovascular risk.  -ASCVD risk 21% (high) -Current treatment: Simvastatin 40 mg daily - Appropriate, Query Effective -Medications previously tried: none - pt denies previous trials of statins or intolerances -Educated on Cholesterol goals;  -Recommended to continue current medication; consider trial of high intensity statin  Diabetes (A1c goal <7%) -Uncontrolled, worsening-  A1c 9.3% (02/2022) worsened from 8.8%;   -[facility] not following [facility] right now due to scheduling conflicts -Family history: both grandfathers lost legs due to DM -Current home glucose readings: not checking yet -Current medications: Metformin 500 mg - 1 tab BID - Appropriate, Query Effective Trulicity 4.5 mg weekly (PAP) - Appropriate, Query Effective Glimepiride 4 mg BID (started ~2012) -Appropriate, Query Effective Testing supplies -Medications previously tried: Jardiance (yeast infections, cost), metformin 2g/day (diarrhea) -Current meal patterns: 2 coffees per day with cream and no sugar; - Discussed options to improve DM control include basal insulin, Actos, or switching Trulicity to Ozempic/Mounjaro; pt declines changes and wants to work on lifestyle -Advised to start checking BG once a day - alternate fasting and 2-hr post prandial -Recommend to continue current medication;  consider Mounjaro in future (2024 - PAP may be available)  Osteopenia (Goal: prevent fractures ) -Controlled -Last DEXA Scan:  03/07/21  T-Score femoral neck: -1.5   T-Score forearm radius: Normal 0.3  10-year probability of major osteoporotic fracture: 15.1%  10-year probability of hip fracture: 1.8% -Patient is not a candidate for pharmacologic treatment -She fell a couple years ago on  ice. No falls since then. She tries to stay very cautious. Denies any history of hip fractures. -Current treatment  Vitamin D 2000 IU daily - Appropriate, Effective, Safe, Accessible -Medications previously tried: none   -Recommend [phone] units of vitamin D daily. Recommend 1200 mg of calcium daily from dietary and supplemental sources. Discussed calcium in diet.  -Recommended to continue current medication  Patient Goals/Self-Care Activities Patient will:  - take medications as prescribed as evidenced by patient report and record review focus on medication adherence by routine check glucose daily (rotate timings), document, and provide at future appointments     Medication Assistance:  [place] approved through 03/10/22  Compliance/Adherence/Medication fill history: Care Gaps: NONE  Star-Rating Drugs: Glimepiride 2 mg - PDC 100% Losartan - PDC 100% Metformin - PDC 100% Simvastatin - PDC 076% Trulicity - PAP  Medication Access: Within the past 30 days, how often has patient missed a dose of medication? Stopped Trulicity Is a pillbox or other method used to improve adherence? Yes  Factors that may affect medication adherence? adverse effects of medications and lack of understanding of disease management Are meds synced by current pharmacy? No  Are meds delivered by current pharmacy? Yes  Does patient experience delays in picking up medications due to transportation concerns? No   [facility] Services Reviewed: Is patient disadvantaged to use [facility]?: Yes  Current Rx insurance plan: [facility] Name and location of Current pharmacy:  [id number] [place] [id number]: [phone]: [address] [address]Phone: [phone]: [phone] [facility] services reviewed with patient today?: No  Patient requests to transfer care to [facility]?: No  Reason patient declined to change pharmacies: Disadvantaged due to insurance/mail order   Care Plan and Follow Up Patient Decision:  Patient agrees to Care Plan and Follow-up.  Plan: Telephone follow up appointment with care management team member scheduled for:  2 months  LCharlene Brooke PharmD, BContinuecare [facility] At Medical Center OdessaClinical Pharmacist LFallisPrimary Care at SRock[facility] [phone]

## 2022-02-27 NOTE — Patient Instructions (Signed)
Visit Information  Phone number for Pharmacist: [phone]   Goals Addressed   None     Care Plan : General Pharmacy (Adult)  Updates made by Charlton Haws, RPH since 02/27/2022 12:00 AM     Problem: Hypertension, Hyperlipidemia, Diabetes, and Osteopenia   Priority: High     Long-Range Goal: Disease mgmt   Start Date: 04/02/2021  Expected End Date: 07/09/2022  This Visit's Progress: Not on track  Recent Progress: On track  Priority: High  Note:   Current Barriers:  Unable to achieve control of diabetes   Pharmacist Clinical Goal(s):  Patient will contact provider office for questions/concerns as evidenced notation of same in electronic health record through collaboration with PharmD and provider.   Interventions: 1:1 collaboration with Tonia Ghent, MD regarding development and update of comprehensive plan of care as evidenced by provider attestation and co-signature Inter-disciplinary care team collaboration (see longitudinal plan of care) Comprehensive medication review performed; medication list updated in electronic medical record  Hypertension (BP goal <130/80) -Controlled - per home BP readings; she has taken losartan since 2012, tolerates well. -Current home readings: 117/80  -Current treatment: Losartan 50 mg daily - Appropriate, Effective, Safe, Accessible -Medications previously tried: lisinopril (cough) -Current exercise habits: Stopped going to gym in 2020 after COVID restrictions. She gets a free membership with insurance and she would like to get back into working out. She has 3 acres which keeps her active in the Spring/Summer. We discussed importance of daily exercise, starting with 10-15 minute increments for heart health. -Recommended to continue current medication  Hyperlipidemia: (LDL goal < 70) -Not ideally controlled - LDL 84 (03/2021). LDL goal < 70 per ADA 2023 recommendations for patients with diabetes, age 34-75 at increased  cardiovascular risk.  -ASCVD risk 21% (high) -Current treatment: Simvastatin 40 mg daily - Appropriate, Query Effective -Medications previously tried: none - pt denies previous trials of statins or intolerances -Educated on Cholesterol goals;  -Recommended to continue current medication; consider trial of high intensity statin  Diabetes (A1c goal <7%) -Uncontrolled, worsening-  A1c 9.3% (02/2022) worsened from 8.8%;   -she has transitioned from NP [facility] to [facility] Ladell Pier at [facility] but is not following with them right now due to scheduling conflicts -Family history: both grandfathers lost legs due to DM -Current home glucose readings: not checking yet -Current medications: Metformin 500 mg - 1 tab BID - Appropriate, Query Effective Trulicity 4.5 mg weekly (PAP) - Appropriate, Query Effective Glimepiride 4 mg BID (started ~2012) -Appropriate, Query Effective Testing supplies -Medications previously tried: Jardiance (yeast infections, cost), metformin 2g/day (diarrhea) -Current meal patterns: 2 coffees per day with cream and no sugar; - Discussed options to improve DM control include basal insulin, Actos, or switching Trulicity to Ozempic/Mounjaro; pt declines changes and wants to work on lifestyle -Advised to start checking BG once a day - alternate fasting and 2-hr post prandial -Recommend to continue current medication;  consider Mounjaro in future (2024 - PAP may be available)  Osteopenia (Goal: prevent fractures ) -Controlled -Last DEXA Scan: 03/07/21  T-Score femoral neck: -1.5   T-Score forearm radius: Normal 0.3  10-year probability of major osteoporotic fracture: 15.1%  10-year probability of hip fracture: 1.8% -Patient is not a candidate for pharmacologic treatment -She fell a couple years ago on ice. No falls since then. She tries to stay very cautious. Denies any history of hip fractures. -Current treatment  Vitamin D 2000 IU daily - Appropriate,  Effective, Safe, Accessible -Medications previously  tried: none   -Recommend [phone] units of vitamin D daily. Recommend 1200 mg of calcium daily from dietary and supplemental sources. Discussed calcium in diet.  -Recommended to continue current medication  Patient Goals/Self-Care Activities Patient will:  - take medications as prescribed as evidenced by patient report and record review focus on medication adherence by routine check glucose daily (rotate timings), document, and provide at future appointments      Patient verbalizes understanding of instructions and care plan provided today and agrees to view in [place]. Active MyChart status and patient understanding of how to access instructions and care plan via MyChart confirmed with patient.    Telephone follow up appointment with pharmacy team member scheduled for: 2 months  Charlene Brooke, PharmD, [facility] Clinical Pharmacist [place] [facility] at [facility] [phone]

## 2022-03-01 NOTE — Telephone Encounter (Signed)
Forms printed and placed in front office for patient signature.

## 2022-03-06 NOTE — Telephone Encounter (Signed)
Patient has been made aware her PAP forms are waiting for signature.  Charlene Brooke, CPP notified  Marijean Niemann, [place] Clinical Pharmacy Assistant [phone]

## 2022-03-08 ENCOUNTER — Ambulatory Visit
Admission: RE | Admit: 2022-03-08 | Discharge: 2022-03-08 | Disposition: A | Discharge status: Home / Self Care | Payer: Medicare Other | Source: Ambulatory Visit | Attending: Family Medicine | Admitting: Family Medicine

## 2022-03-08 DIAGNOSIS — Z1231 Encounter for screening mammogram for malignant neoplasm of breast: Secondary | ICD-10-CM | POA: Diagnosis not present

## 2022-03-18 ENCOUNTER — Telehealth: Payer: Self-pay

## 2022-03-18 NOTE — Progress Notes (Signed)
Care Management & Coordination Services Pharmacy Team  Reason for Encounter: Diabetes  Contacted patient on 03/18/2022 to discuss diabetes disease state.   Recent office visits:  None since last CCM contact  Recent consult visits:  03/08/22 [facility] visits:  None in previous 6 months  Medications: Outpatient Encounter Medications as of 03/18/2022  Medication Sig   Cholecalciferol (VITAMIN D) 50 MCG (2000 UT) tablet Take 1 tablet (2,000 Units total) by mouth daily.   Dulaglutide (TRULICITY) 4.5 JK/9.3OI SOPN Inject 4.5 mg as directed once a week.   glimepiride (AMARYL) 4 MG tablet Take 4 mg by mouth daily with breakfast.   losartan (COZAAR) 50 MG tablet TAKE 1 TABLET BY MOUTH DAILY   metFORMIN (GLUCOPHAGE) 500 MG tablet Take 1 tablet (500 mg total) by mouth 2 (two) times daily with a meal.   simvastatin (ZOCOR) 40 MG tablet TAKE 1 TABLET BY MOUTH AT  BEDTIME   No facility-administered encounter medications on file as of 03/18/2022.    Recent Relevant Labs: Lab Results  Component Value Date/Time   HGBA1C 9.3 (H) 02/15/2022 09:54 AM   HGBA1C 8.8 (H) 12/13/2021 11:05 AM   HGBA1C 9.8 11/11/2019 12:00 AM   MICROALBUR 19.9 (H) 02/15/2022 11:52 AM   MICROALBUR 1.0 10/05/2008 09:54 AM    Kidney Function Lab Results  Component Value Date/Time   CREATININE 0.88 12/13/2021 11:05 AM   CREATININE 0.81 03/20/2021 11:44 AM   GFR 67.07 12/13/2021 11:05 AM   GFRNONAA 68.49 01/02/2010 08:19 AM   GFRAA 112 07/16/2007 09:40 AM    Current antihyperglycemic regimen:  Metformin 500 mg - 1 tab BID  Trulicity 3 mg weekly (PAP)  Glimepiride 4 mg BID (started ~2012) Testing supplies   Patient verbally confirms she is taking the above medications as directed. Yes  What diet changes have been made to improve diabetes control? No changes  What recent interventions/DTPs have been made to improve glycemic control:  Recommended to increase Trulicity to 4.5 mg weekly   Have there been  any recent hospitalizations or ED visits since last visit with PharmD? No  Patient denies hypoglycemic symptoms, including Pale, Sweaty, Shaky, Hungry, Nervous/irritable, and Vision changes  Patient denies hyperglycemic symptoms, including blurry vision, excessive thirst, fatigue, polyuria, and weakness  How often are you checking your blood sugar? Patient has not been checking her blood sugar; stated she is too busy to do so.   I have asked patient to take their blood glucose daily and keep a log. Advised patient I would call back on 04/05/2022 for log. Patient verbalized understanding and agreed.   What are your blood sugars ranging? Patient does not know  During the week, how often does your blood glucose drop below 70? Patient does not know  Are you checking your feet daily/regularly? Yes  Adherence Review: Is the patient currently on a STATIN medication? Yes Is the patient currently on ACE/ARB medication? Yes Does the patient have >5 day gap between last estimated fill dates? No  Star Rating Drugs:  Medication:  Last Fill: Day Supply Glimepiride 2 mg 03/01/2022      100 Losartan 50 mg 02/04/2022      100 Metformin 500 Mg 02/07/2022      100       Simvastatin 40 mg 71/24/5809      983 Trulicity 4.5 mg           PAP  Care Gaps: Annual wellness visit in last year? Yes 01/29/2022 Last eye exam /  retinopathy screening: Up to date Last diabetic foot exam: Up to date  Marijean Niemann, [place]

## 2022-04-05 NOTE — Progress Notes (Signed)
Called patient for blood glucose readings as previously discussed. No answer; left message.  Charlene Brooke, PharmD notified  Marijean Niemann, [place] Clinical Pharmacy Assistant [phone]

## 2022-04-10 ENCOUNTER — Ambulatory Visit: Payer: Medicare Other | Admitting: Podiatry

## 2022-04-10 ENCOUNTER — Encounter: Payer: Self-pay | Admitting: Podiatry

## 2022-04-10 VITALS — BP 193/101 | HR 104

## 2022-04-10 DIAGNOSIS — M722 Plantar fascial fibromatosis: Secondary | ICD-10-CM | POA: Diagnosis not present

## 2022-04-10 DIAGNOSIS — D2372 Other benign neoplasm of skin of left lower limb, including hip: Secondary | ICD-10-CM | POA: Diagnosis not present

## 2022-04-10 MED ORDER — TRIAMCINOLONE ACETONIDE 40 MG/ML IJ SUSP
20.0000 mg | Freq: Once | INTRAMUSCULAR | Status: AC
  Administered 2022-04-10: 20 mg

## 2022-04-10 NOTE — Progress Notes (Signed)
Subjective:  Patient ID: Deborah Alvarado, female    DOB: November 01, 1950,  MRN: [id number] HPI Chief Complaint  Patient presents with   Foot Pain    Plantar forefoot left - callused areas x 2, been there for years, but has gotten bigger, no treatment   New Patient (Initial Visit)    72 y.o. female presents with the above complaint.   ROS: Denies fever chills nausea vomit muscle aches pains calf pain back pain chest pain shortness of breath.  Past Medical History:  Diagnosis Date   Allergy    Anxiety    Diabetes mellitus    Type II   Hyperlipidemia    Hypertension    NSVD (normal spontaneous vaginal delivery)    x 2   Past Surgical History:  Procedure Laterality Date   TOTAL ABDOMINAL HYSTERECTOMY  01/2005   due to fibroids    Current Outpatient Medications:    Cholecalciferol (VITAMIN D) 50 MCG (2000 UT) tablet, Take 1 tablet (2,000 Units total) by mouth daily., Disp: , Rfl:    Dulaglutide (TRULICITY) 4.5 BO/1.7PZ SOPN, Inject 4.5 mg as directed once a week., Disp: 8 mL, Rfl: 1   glimepiride (AMARYL) 4 MG tablet, Take 4 mg by mouth daily with breakfast., Disp: , Rfl:    losartan (COZAAR) 50 MG tablet, TAKE 1 TABLET BY MOUTH DAILY, Disp: 100 tablet, Rfl: 2   metFORMIN (GLUCOPHAGE) 500 MG tablet, Take 1 tablet (500 mg total) by mouth 2 (two) times daily with a meal., Disp: , Rfl:    simvastatin (ZOCOR) 40 MG tablet, TAKE 1 TABLET BY MOUTH AT  BEDTIME, Disp: 100 tablet, Rfl: 2  Allergies  Allergen Reactions   Lisinopril     Cough, presumed   Metformin And Related Other (See Comments)    Diarrhea at >'1000mg'$  a day   Review of Systems Objective:   Vitals:   04/10/22 0928  BP: (!) 193/101  Pulse: (!) 104    General: Well developed, nourished, in no acute distress, alert and oriented x3   Dermatological: Skin is warm, dry and supple bilateral. Nails x 10 are well maintained; remaining integument appears unremarkable at this time. There are no open sores, no preulcerative  lesions, no rash or signs of infection present.  Benign skin lesion subfirst subsecond subfifth left foot.  No open lesions or wounds.  Vascular: Dorsalis Pedis artery and Posterior Tibial artery pedal pulses are 2/4 bilateral with immedate capillary fill time. Pedal hair growth present. No varicosities and no lower extremity edema present bilateral.   Neruologic: Grossly intact via light touch bilateral. Vibratory intact via tuning fork bilateral. Protective threshold with Semmes Wienstein monofilament intact to all pedal sites bilateral. Patellar and Achilles deep tendon reflexes 2+ bilateral. No Babinski or clonus noted bilateral.   Musculoskeletal: No gross boney pedal deformities bilateral. No pain, crepitus, or limitation noted with foot and ankle range of motion bilateral. Muscular strength 5/5 in all groups tested bilateral.  Pain on palpation medial calcaneal tubercle left heel.  Hammertoe second left resulting in plantarflexed second metatarsal and benign skin lesion  Gait: Unassisted, Nonantalgic.    Radiographs:  None taken  Assessment & Plan:   Assessment: Hammertoe deformity benign skin lesions forefoot left.  Plan fasciitis left.  Plan: Discussed etiology pathology conservative versus surgical therapies.  I injected plantar fascia calcaneal insertion site 20 mg Kenalog 5 mg Marcaine.  Debrided all benign skin lesions.  Follow-up as needed.  She has recently retired from [employer].  She  knew me when I was younger.     Matheus Spiker T. [place], [place]

## 2022-04-22 ENCOUNTER — Telehealth: Payer: Self-pay

## 2022-04-22 NOTE — Progress Notes (Signed)
Care Management & Coordination Services Pharmacy Team  Reason for Encounter: Appointment Reminder  Contacted patient to confirm telephone appointment with Charlene Brooke, PharmD on 04/25/2022 at 8:45.  Unsuccessful outreach. Left voicemail for patient to return call.  Star Rating Drugs:  Medication:                Last Fill:         Day Supply Glimepiride 2 mg 03/01/2022      100 Losartan 50 mg 02/04/2022      100 Metformin 500 mg 02/07/2022      100       Simvastatin 40 mg 0000000      [id number] Trulicity 4.5 mg           PAP  Care Gaps: Annual wellness visit in last year? Yes 01/29/2022  Last eye exam / retinopathy screening: Up to date Last diabetic foot exam: Up to date   Charlene Brooke, PharmD notified  Marijean Niemann, [place] Assistant [phone]

## 2022-04-25 ENCOUNTER — Ambulatory Visit: Payer: Medicare Other | Admitting: Pharmacist

## 2022-04-25 NOTE — Progress Notes (Signed)
Care Management & Coordination Services Pharmacy Note  04/25/2022 Name:  Deborah Alvarado MRN:  [id number] DOB:  November 28, 1950  Summary: F/U visit -DM: A1c 9.3% (02/2022) above goal and worsening (previously 8.8%); pt reports BG today was 200; she also reports increased frequency of urination and burning with urination, discussed high glucose can cause polyuria -HTN: BP was elevated in recent OV (193/101); pt reports this was a fluke and BP at home same day was 120/80  Recommendations/Changes made from today's visit: -Advised acute visit to address urinary issues - transferred pt to scheduling -Reviewed hydration and exercise as ways to accelerate glucose lowering  Follow up plan: -Pharmacist follow up televisit scheduled for 2 months -PCP appt 05/23/22   Subjective: Deborah Alvarado is an 72 y.o. year old female who is a primary patient of [name], Elveria Rising, MD.  The care coordination team was consulted for assistance with disease management and care coordination needs.    Engaged with patient by telephone for follow up visit.  Recent office visits: 02/15/22 Dr [name] OV: DM - A1c 9.3%. Pt declined insulin, will work on diet. F/u 3 months.   01/17/22 NP Matt Cable: UTI. Rx Macrobid.  Recent consult visits: 04/10/22 Podiatry - hammertoe deformity.   05/17/21 Dr Ladell Pier (Endocrine): f/u DM. Pt reports A1c 6.3 per home health recently, unclear how accurate this is given A1c 8.8% in January and no changes since then. Advised to check sugar BID and call office in a week. F/U 3-4 months.   Hospital visits: None in previous 6 months   Objective:  Lab Results  Component Value Date   CREATININE 0.88 12/13/2021   BUN 12 12/13/2021   GFR 67.07 12/13/2021   GFRNONAA 68.49 01/02/2010   GFRAA 112 07/16/2007   NA 138 12/13/2021   K 4.4 12/13/2021   CALCIUM 9.7 12/13/2021   CO2 25 12/13/2021   GLUCOSE 191 (H) 12/13/2021    Lab Results  Component Value Date/Time   HGBA1C 9.3 (H) 02/15/2022  09:54 AM   HGBA1C 8.8 (H) 12/13/2021 11:05 AM   HGBA1C 9.8 11/11/2019 12:00 AM   GFR 67.07 12/13/2021 11:05 AM   GFR 74.47 03/20/2021 11:44 AM   MICROALBUR 19.9 (H) 02/15/2022 11:52 AM   MICROALBUR 1.0 10/05/2008 09:54 AM    Last diabetic Eye exam:  Lab Results  Component Value Date/Time   HMDIABEYEEXA No Retinopathy 11/07/2021 12:00 AM    Last diabetic Foot exam:  Lab Results  Component Value Date/Time   HMDIABFOOTEX yes 12/15/2009 12:00 AM     Lab Results  Component Value Date   CHOL 141 12/13/2021   HDL 45.00 12/13/2021   LDLCALC 59 12/13/2021   LDLDIRECT 84.0 03/20/2021   TRIG 184.0 (H) 12/13/2021   CHOLHDL 3 12/13/2021       Latest Ref Rng & Units 12/13/2021   11:05 AM 03/20/2021   11:44 AM 12/29/2017   11:52 AM  Hepatic Function  Total Protein 6.0 - 8.3 g/dL 6.6  7.0  7.2   Albumin 3.5 - 5.2 g/dL 4.4  4.5  4.5   AST 0 - 37 U/L 21  28  21   $ ALT 0 - 35 U/L 25  32  30   Alk Phosphatase 39 - 117 U/L 88  78  96   Total Bilirubin 0.2 - 1.2 mg/dL 0.6  0.5  0.6     Lab Results  Component Value Date/Time   TSH 2.91 10/05/2008 09:54 AM   TSH 2.10 07/16/2007 09:40  AM       Latest Ref Rng & Units 10/05/2008    9:54 AM  CBC  WBC 4.5 - 10.5 10*3/microliter 5.2   Hemoglobin 12.0 - 15.0 g/dL 13.8   Hematocrit 36.0 - 46.0 % 40.6   Platelets 150.0 - 400.0 K/uL 220.0     Lab Results  Component Value Date/Time   VD25OH 48.29 12/13/2021 11:05 AM   VD25OH 45.56 03/20/2021 11:44 AM    Clinical ASCVD: No  The 10-year ASCVD risk score (Arnett DK, et al., 2019) is: 17.3%   Values used to calculate the score:     Age: 72 years     Sex: Female     Is Non-Hispanic African American: No     Diabetic: Yes     Tobacco smoker: No     Systolic Blood Pressure: 123456 mmHg     Is BP treated: Yes     HDL Cholesterol: 45 mg/dL     Total Cholesterol: 141 mg/dL        01/29/2022    8:14 AM 01/23/2021    1:28 PM 02/28/2020    2:31 PM  Depression screen PHQ 2/9  Decreased  Interest 0 0 0  Down, Depressed, Hopeless 0 0 0  PHQ - 2 Score 0 0 0  Altered sleeping 0    Tired, decreased energy 0    Change in appetite 0    Feeling bad or failure about yourself  0    Trouble concentrating 0    Moving slowly or fidgety/restless 0    Suicidal thoughts 0    PHQ-9 Score 0    Difficult doing work/chores Not difficult at all       Social History   Tobacco Use  Smoking Status Never  Smokeless Tobacco Never   BP Readings from Last 3 Encounters:  04/25/22 120/80  04/10/22 (!) 193/101  02/15/22 120/82   Pulse Readings from Last 3 Encounters:  04/10/22 (!) 104  02/15/22 65  01/17/22 67   Wt Readings from Last 3 Encounters:  02/15/22 156 lb (70.8 kg)  01/29/22 156 lb (70.8 kg)  01/17/22 156 lb 6 oz (70.9 kg)   BMI Readings from Last 3 Encounters:  02/15/22 26.78 kg/m  01/29/22 26.78 kg/m  01/17/22 26.84 kg/m    Allergies  Allergen Reactions   Lisinopril     Cough, presumed   Metformin And Related Other (See Comments)    Diarrhea at >1077m a day    Medications Reviewed Today     Reviewed by FCharlton Haws [facility](Pharmacist) on 04/25/22 at 0[place] Med List Status: <None>   Medication Order Taking? Sig Documenting Provider Last Dose Status Informant  Cholecalciferol (VITAMIN D) 50 MCG (2000 UT) tablet [id number] Take 1 tablet (2,000 Units total) by mouth daily. DTonia Ghent MD Taking Active   Dulaglutide (TRULICITY) 4.5 M0000000SBonney [id number] Inject 4.5 mg as directed once a week. DTonia Ghent MD Taking Active   glimepiride (AMARYL) 4 MG tablet [id number] Take 4 mg by mouth daily with breakfast. OLonia Farber MD Taking Active   losartan (COZAAR) 50 MG tablet [id number] TAKE 1 TABLET BY MOUTH DAILY DTonia Ghent MD Taking Active   metFORMIN (GLUCOPHAGE) 500 MG tablet [id number] Take 1 tablet (500 mg total) by mouth 2 (two) times daily with a meal. DTonia Ghent MD Taking Active   simvastatin (ZOCOR) 40  MG tablet [id number] TAKE 1 TABLET BY MOUTH AT  BEDTIME Tonia Ghent, MD Taking Active             SDOH:  (Social Determinants of Health) assessments and interventions performed: No SDOH Interventions    Flowsheet Row Clinical Support from 01/29/2022 in [place] at [place] Management from 04/02/2021 in [place] at [place] Telephone from 02/07/2021 in [place] Interventions Intervention Not Indicated -- --  Housing Interventions Intervention Not Indicated Intervention Not Indicated --  Transportation Interventions Intervention Not Indicated -- --  Utilities Interventions Intervention Not Indicated -- --  Alcohol Usage Interventions Intervention Not Indicated (Score <7) -- --  Financial Strain Interventions Intervention Not Indicated Other (Comment)  [Patient assistance] Other (Comment)  [patient refered to Pharmacy consult no other financial needs in the donut hole presently]  Physical Activity Interventions Intervention Not Indicated -- --  Stress Interventions Intervention Not Indicated -- --  Social Connections Interventions Intervention Not Indicated -- --       Medication Assistance:  [place] 2024 approved  Medication Access: Within the past 30 days, how often has patient missed a dose of medication? 0 Is a pillbox or other method used to improve adherence? Yes  Factors that may affect medication adherence? no barriers identified Are meds synced by current pharmacy? No  Are meds delivered by current pharmacy? Yes  Does patient experience delays in picking up medications due to transportation concerns? No   [facility] Services Reviewed: Is patient disadvantaged to use [facility]?: Yes  Current Rx insurance plan: [facility] Name and location of Current pharmacy:  [id number] [place]  [address]: [phone]: [address] [address]Phone: [phone]: [phone] [facility] services reviewed with patient today?: No  Patient requests to transfer care to [facility]?: No  Reason patient declined to change pharmacies: Disadvantaged due to insurance/mail order  Compliance/Adherence/Medication fill history: Care Gaps: Foot exam (due 03/2022)  Star-Rating Drugs: Glimepiride - PDC 100% Losartan -PDC 100% Metformin - PDC 100% Simvastatin - PDC [id number] - PAP   ASSESSMENT / PLAN  Hypertension (BP goal <130/80) -Controlled - BP was elevated in recent OV, but per pt report BP has been normal at home -Current home readings: 120/80 -Current treatment: Losartan 50 mg daily - Appropriate, Effective, Safe, Accessible -Medications previously tried: lisinopril (cough) -Current exercise habits: Stopped going to gym in 2020 after COVID restrictions. She gets a free membership with insurance and she would like to get back into working out. She has 3 acres which keeps her active in the Spring/Summer. We discussed importance of daily exercise, starting with 10-15 minute increments for heart health. -Recommended to continue current medication  Hyperlipidemia: (LDL goal < 70) -Controlled- LDL 59 (12/2021) at goal -ASCVD risk 17% (moderate) -Current treatment: Simvastatin 40 mg daily - Appropriate, Effective, Safe, Accessible -Medications previously tried: none - pt denies previous trials of statins or intolerances -Educated on Cholesterol goals;  -Recommended to continue current medication  Diabetes (A1c goal <7%) -Uncontrolled, worsening-  A1c 9.3% (02/2022) worsened from 8.8%; pt c/o increased frequency of urination and burning with urination, she has been applying some yeast infection creams which has  helped some -pt has been working on improving diet,  she is drinking a lot of water and eating more vegetables -she has transitioned from NP [facility] to Dr Ladell Pier at [place] [facility] but is not following with them right now due to scheduling conflicts -Family history: both grandfathers lost legs due to DM -Current home glucose readings: 200 today -Current medications: Metformin 500 mg - 1 tab BID - Appropriate, Query Effective Trulicity 4.5 mg weekly (PAP) - Appropriate, Query Effective Glimepiride 4 mg BID (started ~2012) -Appropriate, Query Effective Testing supplies -Medications previously tried: Jardiance (yeast infections, cost), metformin 2g/day (diarrhea) -Current meal patterns: 2 coffees per day with cream and no sugar; - Discussed options to improve DM control include basal insulin, Actos, or switching Trulicity to Ozempic/Mounjaro; pt declines changes and wants to work on lifestyle -Discussed high glucose can cause polyuria which may be contributing to current symptoms, she may also have a UTI; transferred pt to front desk to schedule acute visit to discuss -Recommend to continue current medication; consider Mounjaro in future (2024 - PAP may be available)  Osteopenia (Goal: prevent fractures) -Controlled -She fell a couple years ago on ice. No falls since then. She tries to stay very cautious. Denies any history of hip fractures. -Last DEXA Scan: 03/07/21  T-Score femoral neck: -1.5   T-Score forearm radius: Normal 0.3  10-year probability of major osteoporotic fracture: 15.1%  10-year probability of hip fracture: 1.8% -Patient is not a candidate for pharmacologic treatment -Current treatment  Vitamin D 2000 IU daily - Appropriate, Effective, Safe, Accessible -Medications previously tried: none   -Recommend [phone] units of vitamin D daily. Recommend 1200 mg of calcium daily from dietary and supplemental sources. Discussed calcium in diet.  -Recommended to continue  current medication   Charlene Brooke, PharmD, BCACP Clinical Pharmacist [facility] at [facility] [phone]

## 2022-04-25 NOTE — Patient Instructions (Signed)
Visit Information  Phone number for Pharmacist: [phone]  Thank you for meeting with me to discuss your medications! Below is a summary of what we talked about during the visit:   Recommendations/Changes made from today's visit: -Advised acute visit to address urinary issues - transferred pt to scheduling -Reviewed hydration and exercise as ways to accelerate glucose lowering  Follow up plan: -Pharmacist follow up televisit scheduled for 2 months -PCP appt 05/23/22   Charlene Brooke, PharmD, BCACP Clinical Pharmacist [facility] at [facility] [phone]

## 2022-04-30 ENCOUNTER — Ambulatory Visit (INDEPENDENT_AMBULATORY_CARE_PROVIDER_SITE_OTHER): Payer: Medicare Other | Admitting: Primary Care

## 2022-04-30 ENCOUNTER — Ambulatory Visit: Payer: Medicare Other | Admitting: Family Medicine

## 2022-04-30 ENCOUNTER — Encounter: Payer: Self-pay | Admitting: Primary Care

## 2022-04-30 VITALS — BP 134/92 | HR 80 | Temp 98.1°F | Ht 64.0 in | Wt 156.0 lb

## 2022-04-30 DIAGNOSIS — E119 Type 2 diabetes mellitus without complications: Secondary | ICD-10-CM

## 2022-04-30 DIAGNOSIS — R300 Dysuria: Secondary | ICD-10-CM

## 2022-04-30 DIAGNOSIS — N898 Other specified noninflammatory disorders of vagina: Secondary | ICD-10-CM

## 2022-04-30 LAB — POC URINALSYSI DIPSTICK (AUTOMATED)
Bilirubin, UA: NEGATIVE
Blood, UA: NEGATIVE
Glucose, UA: POSITIVE — AB
Ketones, UA: NEGATIVE
Nitrite, UA: NEGATIVE
Protein, UA: POSITIVE — AB
Spec Grav, UA: 1.025 (ref 1.010–1.025)
Urobilinogen, UA: 0.2 E.U./dL
pH, UA: 5.5 (ref 5.0–8.0)

## 2022-04-30 NOTE — Progress Notes (Signed)
Established Patient Office Visit  Subjective   Patient ID: Deborah Alvarado, female    DOB: 04/08/1950  Age: 72 y.o. MRN: [id number]  Chief Complaint  Patient presents with   Dysuria    Burning with urination and urinary frequency, vaginal itching for a little over a week Was taking amoxicillin and has previously had yeast infection with this medication. She has since stopped taking it.      Dysuria  Associated symptoms include frequency. Pertinent negatives include no chills, flank pain, hematuria, nausea, urgency or vomiting.    Deborah Alvarado is a 72 year old female, patient of Dr. [name], with past medical history of HTN, dysphagia, type 2 diabetes, HLD, anxiety presents today to discuss dysuria and vaginal itching.   She states that her symptoms started one week ago with burning with urination, urine frequency, vaginal itching. She had a urine culture done on 01/17/22 which showed E.coli with multiple resistance. She reports that her symptoms never really cleared up. Her symptoms did not resolve. She is still having burning with urination, painful urination, vaginal itching with no discharge. She denies any vaginal bleeding or hematuria.   She was prescribed Augmentin in December. She reports that with antibiotics, she usually gets a yeast infection. She tried monistat with no relief.   Diabetes: Currently managed Trulicity 4.5 once weekly. Metformin 500 mg BID and Glimepiride 4 mg daily. She checks her blood sugars with her watch 2-3 times daily. It gives her a A1C reading and it has been ranging 6-7% at home. She has been trying to include more vegetables, avoids sweets and does drink about 4 oz of regular soda or sweet tea 3-4 times a week. She has been trying to increase her water intake. She denies excess hunger, thirst.   Patient Active Problem List   Diagnosis Date Noted   Cystitis with hematuria 01/17/2022   Glucosuria 01/17/2022   Dysphagia 11/18/2021   Dysuria 03/21/2021    Tremor of left hand 03/21/2021   Knee pain 03/21/2021   Healthcare maintenance 03/01/2020   Osteopenia 04/15/2018   Vaginal itching 12/31/2017   Sinus tachycardia 12/31/2017   Cervical radiculitis 09/23/2017   Advance care planning 02/07/2017   Welcome to Medicare preventive visit 03/29/2011   Transaminitis 03/29/2011   Hyperlipidemia 07/13/2010   Acute non-recurrent sinusitis 04/09/2010   PLANTAR FASCIITIS, LEFT 01/20/2008   Diabetes mellitus without complication ([place]) [id number]   ANXIETY 07/31/2006   Essential hypertension 07/31/2006   ALLERGIC RHINITIS 07/31/2006   ACNE ROSACEA 04/11/2005   Past Medical History:  Diagnosis Date   Allergy    Anxiety    Diabetes mellitus    Type II   Hyperlipidemia    Hypertension    NSVD (normal spontaneous vaginal delivery)    x 2   Past Surgical History:  Procedure Laterality Date   TOTAL ABDOMINAL HYSTERECTOMY  01/2005   due to fibroids   Family History  Problem Relation Age of Onset   Cancer Mother        Leukemia   Diabetes Brother    Breast cancer Maternal Grandmother    Colon cancer Neg Hx    Allergies  Allergen Reactions   Lisinopril     Cough, presumed   Metformin And Related Other (See Comments)    Diarrhea at >1039m a day      Review of Systems  Constitutional:  Negative for chills and fever.  Respiratory:  Negative for shortness of breath.   Cardiovascular:  Negative for  chest pain.  Gastrointestinal:  Negative for nausea and vomiting.  Genitourinary:  Positive for dysuria and frequency. Negative for flank pain, hematuria and urgency.  Musculoskeletal:  Negative for back pain.  Neurological:  Negative for dizziness and headaches.      Objective:     BP (!) 134/92   Pulse 80   Temp 98.1 F (36.7 C) (Temporal)   Ht 5' 4"$  (1.626 m)   Wt 156 lb (70.8 kg)   SpO2 100%   BMI 26.78 kg/m  BP Readings from Last 3 Encounters:  04/30/22 (!) 134/92  04/25/22 120/80  04/10/22 (!) 193/101   Wt Readings  from Last 3 Encounters:  04/30/22 156 lb (70.8 kg)  02/15/22 156 lb (70.8 kg)  01/29/22 156 lb (70.8 kg)      Physical Exam Vitals and nursing note reviewed. Exam conducted with a chaperone present.  Constitutional:      Appearance: Normal appearance.  Cardiovascular:     Rate and Rhythm: Normal rate and regular rhythm.     Pulses: Normal pulses.     Heart sounds: Normal heart sounds.  Pulmonary:     Effort: Pulmonary effort is normal.     Breath sounds: Normal breath sounds.  Genitourinary:    Exam position: Lithotomy position.     Vagina: No vaginal discharge or tenderness.  Neurological:     Mental Status: She is alert and oriented to person, place, and time.      Results for orders placed or performed in visit on 04/30/22  POCT Urinalysis Dipstick (Automated)  Result Value Ref Range   Color, UA yellow    Clarity, UA clear    Glucose, UA Positive (A) Negative   Bilirubin, UA neg    Ketones, UA neg    Spec Grav, UA 1.025 1.010 - 1.025   Blood, UA neg    pH, UA 5.5 5.0 - 8.0   Protein, UA Positive (A) Negative   Urobilinogen, UA 0.2 0.2 or 1.0 E.U./dL   Nitrite, UA neg    Leukocytes, UA Small (1+) (A) Negative       The 10-year ASCVD risk score (Arnett DK, et al., 2019) is: 21.1%    Assessment & Plan:   Problem List Items Addressed This Visit       Endocrine   Diabetes mellitus without complication (HCC)    3+ glucose on UA today. Suspect her urinary symptoms could be secondary due to uncontrolled blood sugars.   Seems controlled with A1C reading on her watch. Recommended patient check blood sugar with fingerstick at least once daily.   Continue Trulicity 4.5 once weekly. Metformin 517m BID. Glimipiride 4 mg daily. Long discussion with patient about diabetes and diet.   Follow up with Dr. DDamita Dunningsas scheduled.          Genitourinary   Vaginal itching    Suspect symptoms could be secondary due to uncontrolled diabetes.  Wet prep swab sent.    Await results.      Relevant Orders   WET PREP BY MOLECULAR PROBE     Other   Dysuria - Primary    POC UA dipstick positive for 1+ leukocytes, + protein, +3 glucose  Urine culture sent.   Await results.      Relevant Orders   POCT Urinalysis Dipstick (Automated) (Completed)   Urine Culture    No follow-ups on file.    BTinnie Gens BSN-RN, DNP STUDENT

## 2022-04-30 NOTE — Patient Instructions (Signed)
We will in touch with you regarding the your urine culture and wet prep results.   Continue to monitor your diet and continue to do regular exercise.   It is important that you improve your diet. Please limit carbohydrates in the form of white bread, rice, pasta, sweets, fast food, fried food, sugary drinks, etc. Increase your consumption of fresh fruits and vegetables, whole grains, lean protein.  Ensure you are consuming 64 ounces of water daily.  Follow up with Dr. [name] as scheduled.  It was a pleasure to see you today!

## 2022-04-30 NOTE — Assessment & Plan Note (Addendum)
POC UA dipstick positive for 1+ leukocytes, + protein, +3 glucose  Urine culture ordered and pending. Reviewed urine culture from November 2023 and December 2023.  Await all results first.   I evaluated patient, was consulted regarding treatment, and agree with assessment and plan per Tinnie Gens, RN, DNP student.   Allie Bossier, NP-C

## 2022-04-30 NOTE — Assessment & Plan Note (Addendum)
Glucosuria could be contributing. Other differentials include cystitis vs vaginal atrophy.  Wet prep swab collected and pending.  Await results.  I evaluated patient, was consulted regarding treatment, and agree with assessment and plan per Tinnie Gens, RN, DNP student.   Allie Bossier, NP-C

## 2022-04-30 NOTE — Assessment & Plan Note (Addendum)
3+ glucose on UA today. Consider glucosuria to be contributing to urinary symptoms.    Recommended patient check blood sugar with fingerstick at least once daily.   Continue Trulicity 4.5 once weekly. Metformin 577m BID. Glimipiride 4 mg daily. Long discussion with patient about diabetes and diet.   Follow up with Dr. DDamita Dunningsas scheduled.   I evaluated patient, was consulted regarding treatment, and agree with assessment and plan per BTinnie Gens RN, DNP student.   KAllie Bossier NP-C

## 2022-04-30 NOTE — Progress Notes (Signed)
Subjective:    Patient ID: Deborah Alvarado, female    DOB: 1951/01/13, 72 y.o.   MRN: [id number]  Dysuria  Associated symptoms include frequency. Pertinent negatives include no flank pain.    Deborah Alvarado is a very pleasant 72 y.o. female patient of Dr. [name] with a history of hypertension, type 2 diabetes (not on SGLT2 inhibitor), hyperlipidemia, dysuria, cystitis (culture positive with resistance) who presents today to discuss dysuria.   Symptom onset "a while ago" with vaginal itching after beginning Augmentin for a sinus infection in December 2023.   Her urinary frequency and dysuria has continued since her UTI in November 2023. Repeat urine culture in December 2023 was negative for infection.   She has a history of cystitis, last UTI was in November 2023, E coli with multiple drug resistance.   She denies hematuria, vaginal bleeding, abdominal pain. She's tried Monistat for her itching without improvement. She recently resumed Trulicity and is on 4.5 mg weekly. She is also compliant to her glimepiride and metformin. She wears a smart watch which watches her A1C which is currently running 6-7.   She drinks soda and sweet tea 3-4 times weekly. She tries to watch her diet and include more vegetables, increasing water intake.    Review of Systems  Gastrointestinal:  Negative for abdominal pain.  Endocrine: Negative for polydipsia and polyphagia.  Genitourinary:  Positive for dysuria and frequency. Negative for flank pain.  Musculoskeletal:  Negative for back pain.         Past Medical History:  Diagnosis Date   Allergy    Anxiety    Diabetes mellitus    Type II   Hyperlipidemia    Hypertension    NSVD (normal spontaneous vaginal delivery)    x 2    Social History   Socioeconomic History   Marital status: Married    Spouse name: Not on file   Number of children: 2   Years of education: Not on file   Highest education level: Not on file  Occupational History     Employer: [employer]  Tobacco Use   Smoking status: Never   Smokeless tobacco: Never  Substance and Sexual Activity   Alcohol use: No    Alcohol/week: 0.0 standard drinks of alcohol   Drug use: No   Sexual activity: Not on file  Other Topics Concern   Not on file  Social History Narrative   Grandchild died 2022-05-19   Worked at [employer], retired 2019   Social Determinants of [place] Strain: [place]  (01/29/2022)   Overall Financial Resource Strain (CARDIA)    Difficulty of Paying Living Expenses: Not hard at all  Food Insecurity: No Food Insecurity (01/29/2022)   Hunger Vital Sign    Worried About Running Out of Food in the Last Year: Never true    [place] in the Last Year: Never true  Transportation Needs: No Transportation Needs (01/29/2022)   PRAPARE - Hydrologist (Medical): No    Lack of Transportation (Non-Medical): No  Physical Activity: Insufficiently Active (01/29/2022)   Exercise Vital Sign    Days of Exercise per Week: 3 days    Minutes of Exercise per Session: 30 min  Stress: No Stress Concern Present (01/29/2022)   [place]    Feeling of Stress : Not at all  Social Connections: Moderately Isolated (01/29/2022)   Social  Connection and Isolation Panel [NHANES]    Frequency of Communication with Friends and Family: More than three times a week    Frequency of Social Gatherings with Friends and Family: Twice a week    Attends Religious Services: Never    Marine scientist or Organizations: No    Attends Archivist Meetings: Never    Marital Status: Married  Human resources officer Violence: Not At Risk (01/29/2022)   Humiliation, Afraid, Rape, and Kick questionnaire    Fear of Current or Ex-Partner: No    Emotionally Abused: No    Physically Abused: No    Sexually Abused: No    Past Surgical History:  Procedure Laterality  Date   TOTAL ABDOMINAL HYSTERECTOMY  01/2005   due to fibroids    Family History  Problem Relation Age of Onset   Cancer Mother        Leukemia   Diabetes Brother    Breast cancer Maternal Grandmother    Colon cancer Neg Hx     Allergies  Allergen Reactions   Lisinopril     Cough, presumed   Metformin And Related Other (See Comments)    Diarrhea at >108m a day    Current Outpatient Medications on File Prior to Visit  Medication Sig Dispense Refill   Cholecalciferol (VITAMIN D) 50 MCG (2000 UT) tablet Take 1 tablet (2,000 Units total) by mouth daily.     Dulaglutide (TRULICITY) 4.5 M0000000SOPN Inject 4.5 mg as directed once a week. 8 mL 1   glimepiride (AMARYL) 4 MG tablet Take 4 mg by mouth daily with breakfast.     losartan (COZAAR) 50 MG tablet TAKE 1 TABLET BY MOUTH DAILY 100 tablet 2   metFORMIN (GLUCOPHAGE) 500 MG tablet Take 1 tablet (500 mg total) by mouth 2 (two) times daily with a meal.     simvastatin (ZOCOR) 40 MG tablet TAKE 1 TABLET BY MOUTH AT  BEDTIME 100 tablet 2   No current facility-administered medications on file prior to visit.    BP (!) 134/92   Pulse 80   Temp 98.1 F (36.7 C) (Temporal)   Ht 5' 4"$  (1.626 m)   Wt 156 lb (70.8 kg)   SpO2 100%   BMI 26.78 kg/m  Objective:   Physical Exam Cardiovascular:     Rate and Rhythm: Normal rate and regular rhythm.  Pulmonary:     Effort: Pulmonary effort is normal.     Breath sounds: Normal breath sounds.  Musculoskeletal:     Cervical back: Neck supple.  Skin:    General: Skin is warm and dry.           Assessment & Plan:  Dysuria Assessment & Plan: POC UA dipstick positive for 1+ leukocytes, + protein, +3 glucose  Urine culture ordered and pending. Reviewed urine culture from November 2023 and December 2023.  Await all results first.   I evaluated patient, was consulted regarding treatment, and agree with assessment and plan per BTinnie Gens RN, DNP student.   KAllie Bossier  NP-C   Orders: -     POCT Urinalysis Dipstick (Automated) -     Urine Culture  Vaginal itching Assessment & Plan: Glucosuria could be contributing. Other differentials include cystitis vs vaginal atrophy.  Wet prep swab collected and pending.  Await results.  I evaluated patient, was consulted regarding treatment, and agree with assessment and plan per BTinnie Gens RN, DNP student.   KAllie Bossier NP-C   Orders: -  WET PREP BY MOLECULAR PROBE  Diabetes mellitus without complication ([place]) Assessment & Plan: 3+ glucose on UA today. Consider glucosuria to be contributing to urinary symptoms.    Recommended patient check blood sugar with fingerstick at least once daily.   Continue Trulicity 4.5 once weekly. Metformin 544m BID. Glimipiride 4 mg daily. Long discussion with patient about diabetes and diet.   Follow up with Dr. DDamita Dunningsas scheduled.   I evaluated patient, was consulted regarding treatment, and agree with assessment and plan per BTinnie Gens RN, DNP student.   KAllie Bossier NP-C           KPleas Koch NP

## 2022-05-02 ENCOUNTER — Other Ambulatory Visit: Payer: Self-pay | Admitting: Primary Care

## 2022-05-02 DIAGNOSIS — N3000 Acute cystitis without hematuria: Secondary | ICD-10-CM

## 2022-05-02 DIAGNOSIS — B3731 Acute candidiasis of vulva and vagina: Secondary | ICD-10-CM

## 2022-05-02 LAB — WET PREP BY MOLECULAR PROBE
Candida species: DETECTED — AB
MICRO NUMBER:: 14589471
SPECIMEN QUALITY:: ADEQUATE
Trichomonas vaginosis: NOT DETECTED

## 2022-05-02 LAB — URINE CULTURE
MICRO NUMBER:: 14589472
SPECIMEN QUALITY:: ADEQUATE

## 2022-05-02 MED ORDER — FLUCONAZOLE 150 MG PO TABS: ORAL_TABLET | ORAL | 0 refills | Status: DC

## 2022-05-02 MED ORDER — NITROFURANTOIN MONOHYD MACRO 100 MG PO CAPS: 100.0000 mg | ORAL_CAPSULE | Freq: Two times a day (BID) | ORAL | 0 refills | Status: AC

## 2022-05-23 ENCOUNTER — Ambulatory Visit (INDEPENDENT_AMBULATORY_CARE_PROVIDER_SITE_OTHER): Payer: Medicare Other | Admitting: Family Medicine

## 2022-05-23 ENCOUNTER — Encounter: Payer: Self-pay | Admitting: Family Medicine

## 2022-05-23 VITALS — BP 138/84 | HR 91 | Temp 97.7°F | Ht 64.0 in | Wt 160.0 lb

## 2022-05-23 DIAGNOSIS — E119 Type 2 diabetes mellitus without complications: Secondary | ICD-10-CM | POA: Diagnosis not present

## 2022-05-23 LAB — POCT GLYCOSYLATED HEMOGLOBIN (HGB A1C): Hemoglobin A1C: 10.2 % — AB (ref 4.0–5.6)

## 2022-05-23 MED ORDER — LANTUS SOLOSTAR 100 UNIT/ML ~~LOC~~ SOPN: 5.0000 [IU] | PEN_INJECTOR | Freq: Every day | SUBCUTANEOUS | 99 refills | Status: DC

## 2022-05-23 MED ORDER — PEN NEEDLES 31G X 5 MM MISC: 3 refills | Status: DC

## 2022-05-23 NOTE — Progress Notes (Signed)
Diabetes:  Using medications without difficulties: yes Hypoglycemic episodes: not likely, not checked.  Hyperglycemic episodes: not checked.   Feet problems: some burning, d/w pt.   Blood Sugars averaging: not checked.  A1c 10.2.    Meds, vitals, and allergies reviewed.  ROS: Per HPI unless specifically indicated in ROS section   GEN: nad, alert and oriented HEENT: ncat NECK: supple w/o LA CV: rrr. PULM: ctab, no inc wob ABD: soft, +bs EXT: no edema SKIN: no acute rash  Diabetic foot exam: Normal inspection No skin breakdown No calluses  Normal DP pulses Normal sensation to light touch and monofilament Nails normal

## 2022-05-23 NOTE — Patient Instructions (Signed)
Start with 5 units of lantus at night.   Check your sugar in the AM.  If AM sugar is above 150, then add 1 unit that night.  If 100-150, then no change.  If below 100, then cut back 1 unit.   Update me in about 2 weeks, sooner if needed.   Recheck in about 3 months.  A1c at the visit.    Let me know if you need help with insulin injection.

## 2022-05-26 NOTE — Assessment & Plan Note (Signed)
Start with 5 units of lantus at night.  Routine insulin/insulin pen instructions given to patient. Check sugar in the AM.  If AM sugar is above 150, then add 1 unit that night.  If 100-150, then no change.  If below 100, then cut back 1 unit.   Update me in about 2 weeks, sooner if needed.   Recheck in about 3 months.  A1c at the visit.   She can let me know if she needs help with insulin teaching/administration.

## 2022-06-18 ENCOUNTER — Telehealth: Payer: Self-pay

## 2022-06-18 NOTE — Progress Notes (Unsigned)
Care Management & Coordination Services Pharmacy Team  Reason for Encounter: Appointment Reminder  Contacted patient to confirm telephone appointment with Al Corpus, PharmD on 06/24/2022 at 11:00.  {US HC Outreach:[id number]}  Do you have any problems getting your medications? {yes/no:20286} If yes what types of problems are you experiencing? {Problems:27223}  What is your top health concern you would like to discuss at your upcoming visit?   Have you seen any other providers since your last visit with PCP? No  Star Rating Drugs:  Medication:                Last Fill:         [facility] Supply Glimepiride 2 mg 05/10/2022 90 Losartan 50 mg 05/10/2022 100 Metformin 500 mg      04/18/2022 90 Simvastatin 40 mg 05/10/2022 100 Trulicity 4.5 mg           PAP   Care Gaps: Annual wellness visit in last year? Yes 01/29/2022  If Diabetic: Last eye exam / retinopathy screening: Up to date Last diabetic foot exam: Up to date  Al Corpus, PharmD notified  Claudina Lick, [place] Clinical Pharmacy Assistant [phone]

## 2022-06-24 ENCOUNTER — Ambulatory Visit: Payer: Medicare Other | Admitting: Pharmacist

## 2022-06-24 ENCOUNTER — Telehealth: Payer: Self-pay | Admitting: Pharmacist

## 2022-06-24 DIAGNOSIS — E1165 Type 2 diabetes mellitus with hyperglycemia: Secondary | ICD-10-CM

## 2022-06-24 MED ORDER — FREESTYLE LIBRE 3 SENSOR MISC: 6 refills | Status: DC

## 2022-06-24 NOTE — Progress Notes (Cosign Needed)
Care Management & Coordination Services Pharmacy Note  06/24/2022 Name:  Deborah Alvarado MRN:  [id number] DOB:  [birth year]  Summary: F/U visit -DM: A1c 10.2% (05/2022) above goal and worsening (previously 9.3%); pt has started Lantus 5 unit daily but is not checking BG so has not titrated insulin; she will qualify for CGM now that she is on insulin, she has used [employer] sample before and her phone is compatible with app  Recommendations/Changes made from today's visit: -Ordered Freestyle Libre 3; pt to call if she needs help with setup  Follow up plan: -Pharmacist follow up televisit scheduled for 1 month -PCP appt due ~08/23/22   Subjective: Deborah Alvarado is an 72 y.o. year old female who is a primary patient of Para March, Dwana Curd, MD.  The care coordination team was consulted for assistance with disease management and care coordination needs.    Engaged with patient by telephone for follow up visit.  Recent office visits: 05/23/22 Dr Para March OV: DM - A1c 10.2%; Start Lantus 5 units, goal fasting < 150. Recheck A1c 3 months.  04/30/22 NP Mayra Reel OV: Dysuria - Ucx positive, yeast infection positive. Rx Macrobid and Fluconazole.  02/15/22 Dr Para March OV: DM - A1c 9.3%. Pt declined insulin, will work on diet. F/u 3 months.   01/17/22 NP Matt Cable: UTI. Rx Macrobid.  Recent consult visits: 04/10/22 Podiatry - hammertoe deformity.   05/17/21 Dr Donald Prose (Endocrine): f/u DM. Pt reports A1c 6.3 per home health recently, unclear how accurate this is given A1c 8.8% in January and no changes since then. Advised to check sugar BID and call office in a week. F/U 3-4 months.   Hospital visits: None in previous 6 months   Objective:  Lab Results  Component Value Date   CREATININE 0.88 12/13/2021   BUN 12 12/13/2021   GFR 67.07 12/13/2021   GFRNONAA 68.49 01/02/2010   GFRAA 112 07/16/2007   NA 138 12/13/2021   K 4.4 12/13/2021   CALCIUM 9.7 12/13/2021   CO2 25 12/13/2021   GLUCOSE  191 (H) 12/13/2021    Lab Results  Component Value Date/Time   HGBA1C 10.2 (A) 05/23/2022 09:43 AM   HGBA1C 9.3 (H) 02/15/2022 09:54 AM   HGBA1C 8.8 (H) 12/13/2021 11:05 AM   HGBA1C 9.8 11/11/2019 12:00 AM   GFR 67.07 12/13/2021 11:05 AM   GFR 74.47 03/20/2021 11:44 AM   MICROALBUR 19.9 (H) 02/15/2022 11:52 AM   MICROALBUR 1.0 10/05/2008 09:54 AM    Last diabetic Eye exam:  Lab Results  Component Value Date/Time   HMDIABEYEEXA No Retinopathy 11/07/2021 12:00 AM    Last diabetic Foot exam:  Lab Results  Component Value Date/Time   HMDIABFOOTEX yes 12/15/2009 12:00 AM     Lab Results  Component Value Date   CHOL 141 12/13/2021   HDL 45.00 12/13/2021   LDLCALC 59 12/13/2021   LDLDIRECT 84.0 03/20/2021   TRIG 184.0 (H) 12/13/2021   CHOLHDL 3 12/13/2021       Latest Ref Rng & Units 12/13/2021   11:05 AM 03/20/2021   11:44 AM 12/29/2017   11:52 AM  Hepatic Function  Total Protein 6.0 - 8.3 g/dL 6.6  7.0  7.2   Albumin 3.5 - 5.2 g/dL 4.4  4.5  4.5   AST 0 - 37 U/L ALT 0 - 35 U/L 25  32  30   Alk Phosphatase 39 - 117 U/L 88  78  96  Total Bilirubin 0.2 - 1.2 mg/dL 0.6  0.5  0.6     Lab Results  Component Value Date/Time   TSH 2.91 10/05/2008 09:54 AM   TSH 2.10 07/16/2007 09:40 AM       Latest Ref Rng & Units 10/05/2008    9:54 AM  CBC  WBC 4.5 - 10.5 10*3/microliter 5.2   Hemoglobin 12.0 - 15.0 g/dL 28.4   Hematocrit 13.2 - 46.0 % 40.6   Platelets 150.0 - 400.0 K/uL 220.0     Lab Results  Component Value Date/Time   VD25OH 48.29 12/13/2021 11:05 AM   VD25OH 45.56 03/20/2021 11:44 AM    Clinical ASCVD: No  The 10-year ASCVD risk score (Arnett DK, et al., 2019) is: 22.3%   Values used to calculate the score:     Age: 10 years     Sex: Female     Is Non-Hispanic African American: No     Diabetic: Yes     Tobacco smoker: No     Systolic Blood Pressure: 138 mmHg     Is BP treated: Yes     HDL Cholesterol: 45 mg/dL     Total Cholesterol:  141 mg/dL        4/40/1027    2:53 AM 01/29/2022    8:14 AM 01/23/2021    1:28 PM  Depression screen PHQ 2/9  Decreased Interest 0 0 0  Down, Depressed, Hopeless 0 0 0  PHQ - 2 Score 0 0 0  Altered sleeping 0 0   Tired, decreased energy 1 0   Change in appetite 1 0   Feeling bad or failure about yourself  0 0   Trouble concentrating 0 0   Moving slowly or fidgety/restless 0 0   Suicidal thoughts 0 0   PHQ-9 Score 2 0   Difficult doing work/chores Not difficult at all Not difficult at all      Social History   Tobacco Use  Smoking Status Never  Smokeless Tobacco Never   BP Readings from Last 3 Encounters:  05/23/22 138/84  04/30/22 (!) 134/92  04/25/22 120/80   Pulse Readings from Last 3 Encounters:  05/23/22 91  04/30/22 80  04/10/22 (!) 104   Wt Readings from Last 3 Encounters:  05/23/22 160 lb (72.6 kg)  04/30/22 156 lb (70.8 kg)  02/15/22 156 lb (70.8 kg)   BMI Readings from Last 3 Encounters:  05/23/22 27.46 kg/m  04/30/22 26.78 kg/m  02/15/22 26.78 kg/m    Allergies  Allergen Reactions   Lisinopril     Cough, presumed   Metformin And Related Other (See Comments)    Diarrhea at >1000mg  a day    Medications Reviewed Today     Reviewed by Kathyrn Sheriff, [facility] (Pharmacist) on 06/24/22 at 1140  Med List Status: <None>   Medication Order Taking? Sig Documenting Provider Last Dose Status Informant  Cholecalciferol (VITAMIN D) 50 MCG (2000 UT) tablet [id number] Yes Take 1 tablet (2,000 Units total) by mouth daily. [name], MD Taking Active   Continuous Glucose Sensor (FREESTYLE LIBRE 3 [place]) [place] [id number] Yes Place 1 sensor on the skin every 14 days. Use to check glucose continuously [name], MD  Active   Dulaglutide (TRULICITY) 4.5 MG/0.5ML Namon Cirri [id number] Yes Inject 4.5 mg as directed once a week. [name], MD Taking Active   glimepiride (AMARYL) 4 MG tablet [id number] Yes Take 4 mg by mouth daily with breakfast.  Sherlon Handing, MD Taking  Active   insulin glargine (LANTUS SOLOSTAR) 100 UNIT/ML Solostar Pen [id number] Yes Inject 5-20 Units into the skin daily. [name], MD Taking Active   Insulin Pen Needle (PEN NEEDLES) 31G X 5 MM MISC [id number] Yes Use daily with insulin pen [name], MD Taking Active   losartan (COZAAR) 50 MG tablet [id number] Yes TAKE 1 TABLET BY MOUTH DAILY [name], MD Taking Active   metFORMIN (GLUCOPHAGE) 500 MG tablet [id number] Yes Take 1 tablet (500 mg total) by mouth 2 (two) times daily with a meal. [name], MD Taking Active   simvastatin (ZOCOR) 40 MG tablet [id number] Yes TAKE 1 TABLET BY MOUTH AT  BEDTIME [name], MD Taking Active             SDOH:  (Social Determinants of Health) assessments and interventions performed: No SDOH Interventions    Flowsheet Row Office Visit from 05/23/2022 in [facility] Clinical Support from 01/29/2022 in [facility] Chronic Care Management from 04/02/2021 in [facility] [place] Telephone from 02/07/2021 in [facility] Care Coordination  SDOH Interventions      Food Insecurity Interventions -- Intervention Not Indicated -- --  Housing Interventions -- Intervention Not Indicated Intervention Not Indicated --  Transportation Interventions -- Intervention Not Indicated -- --  Utilities Interventions -- Intervention Not Indicated -- --  Alcohol Usage Interventions -- Intervention Not Indicated (Score <7) -- --  Depression Interventions/Treatment  Counseling -- -- --  Financial Strain Interventions -- Intervention Not Indicated Other (Comment)  [Patient assistance] Other (Comment)  [patient refered to Pharmacy consult no other financial needs in the donut hole presently]  Physical Activity Interventions -- Intervention Not Indicated -- --  Stress Interventions -- Intervention Not  Indicated -- --  Social Connections Interventions -- Intervention Not Indicated -- --       Medication Assistance:  Trulicity [employer] 2024 approved  Medication Access: Within the past 30 days, how often has patient missed a dose of medication? 0 Is a pillbox or other method used to improve adherence? Yes  Factors that may affect medication adherence? no barriers identified Are meds synced by current pharmacy? No  Are meds delivered by current pharmacy? Yes  Does patient experience delays in picking up medications due to transportation concerns? No   [facility] Services Reviewed: Is patient disadvantaged to use [facility]?: Yes  Current Rx insurance plan: [facility] Name and location of Current pharmacy:  [id number] - [place], [place] - [address] [place] [address] Phone: [phone] Fax: [phone]  [facility] - [place], [place] - [address] [place] [address] Phone: [phone] Fax: [phone]  [facility] Pharm[facility] services reviewed with patient today?: No  Patient requests to transfer care to [facility] Pharm[facility]?: No  Reason patient declined to change pharmacies: Disadvantaged due to insurance/mail order  Compliance/Adherence/Medication fill history: Care Gaps: None  Star-Rating Drugs: Glimepiride - PDC 100% Losartan -PDC 100% Metformin - PDC 100% Simvastatin - PDC 100% Trulicity - PAP   ASSESSMENT / PLAN  Diabetes (A1c goal <7%) -Uncontrolled, worsening - A1c 10.2% (05/2022) worsened from 9.3%; pt reports she is taking Lantus 5 units, she has not been checking BG to titrate Lantus; now that she has insulin Medicare will cover CGM -she has transitioned from NP [facility] to Dr Donald Prose at  [facility] but is not following with them right now due to scheduling conflicts -Family history: both grandfathers lost legs due to DM -Current home glucose readings: not checking; pt  denies s/sx of hypoglycemia -Current medications: Metformin 500 mg - 1 tab BID - Appropriate, Query Effective Trulicity 4.5 mg weekly (PAP) - Appropriate, Query Effective Lantus 5 units daily - Appropriate, Query Effective Glimepiride 4 mg BID (started ~2012) -Appropriate, Query Effective Testing supplies -Medications previously tried: Jardiance (yeast infections, cost), metformin 2g/day (diarrhea) -Discussed CGM - pt has used a sample FL3 before, she would like to start CGM; she has a smartphone compatible with Freestyle -Recommend to continue current medication; ordered Freestyle Libre 3 to pharmacy  Hypertension (BP goal <130/80) -Controlled - BP was elevated in recent OV, but per pt report BP has been normal at home -Current home readings: 120/80 -Current treatment: Losartan 50 mg daily - Appropriate, Effective, Safe, Accessible -Medications previously tried: lisinopril (cough) -Current exercise habits: Stopped going to gym in 2020 after COVID restrictions. She gets a free membership with insurance and she would like to get back into working out. She has 3 acres which keeps her active in the Spring/Summer. We discussed importance of daily exercise, starting with 10-15 minute increments for heart health. -Recommended to continue current medication  Hyperlipidemia: (LDL goal < 70) -Controlled- LDL 59 (12/2021) at goal -ASCVD risk 17% (moderate) -Current treatment: Simvastatin 40 mg daily - Appropriate, Effective, Safe, Accessible -Medications previously tried: none - pt denies previous trials of statins or intolerances -Educated on Cholesterol goals;  -Recommended to continue current medication  Osteopenia (Goal: prevent fractures) -Controlled -She fell a couple years ago on ice. No falls since then. She tries to stay very cautious. Denies any history of hip fractures. -Last DEXA Scan: 03/07/21  T-Score femoral neck: -1.5   T-Score forearm radius: Normal 0.3  10-year probability  of major osteoporotic fracture: 15.1%  10-year probability of hip fracture: 1.8% -Patient is not a candidate for pharmacologic treatment -Current treatment  Vitamin D 2000 IU daily - Appropriate, Effective, Safe, Accessible -Medications previously tried: none   -Recommend [phone] units of vitamin D daily. Recommend 1200 mg of calcium daily from dietary and supplemental sources. Discussed calcium in diet.  -Recommended to continue current medication   Al Corpus, PharmD, BCACP Clinical Pharmacist [facility] at [facility] [phone]

## 2022-06-24 NOTE — Patient Instructions (Signed)
Visit Information  Phone number for Pharmacist: [phone]  Thank you for meeting with me to discuss your medications! Below is a summary of what we talked about during the visit:   Recommendations/Changes made from today's visit: -Ordered Freestyle Libre 3; pt to call if she needs help with setup  Follow up plan: -Pharmacist follow up televisit scheduled for 1 month -PCP appt due ~08/23/22   Al Corpus, PharmD, BCACP Clinical Pharmacist [facility] [phone]

## 2022-06-24 NOTE — Telephone Encounter (Signed)
Patient reports she is out of Trulicity. She gets this through [employer], she has not called them to request refill. Pharmacy team will contact [employer] to refill Trulicity 4.5 mg.

## 2022-06-24 NOTE — Telephone Encounter (Cosign Needed)
Spoke with [employer]; patient will receive Trulicity in 5-7 business days. Patient is also now on auto-refill. I called patient and informed her. Patient expressed understanding.   Al Corpus, PharmD notified  Claudina Lick, [place] Clinical Pharmacy Assistant [phone]

## 2022-07-17 ENCOUNTER — Telehealth: Payer: Self-pay

## 2022-07-17 NOTE — Progress Notes (Signed)
Care Management & Coordination Services Pharmacy Team  Reason for Encounter: Appointment Reminder  Contacted patient to confirm telephone appointment with Al Corpus, PharmD on 07/22/2022 at 11:00.  Unsuccessful outreach. Left voicemail for patient to return call.  Star Rating Drugs:  Medication:                Last Fill:         Day Supply Glimepiride 2 mg 05/10/2022      90 Losartan 50 mg 05/10/2022      100 Metformin 500 mg      04/18/2022 90 Simvastatin 40 mg 05/10/2022      100 Trulicity 4.5 mg           PAP   Care Gaps: Annual wellness visit in last year? Yes 01/29/2022   If Diabetic: Last eye exam / retinopathy screening: Up to date Last diabetic foot exam: Up to date   Al Corpus, PharmD notified   Claudina Lick, [place] Clinical Pharmacy Assistant [phone]

## 2022-07-22 ENCOUNTER — Ambulatory Visit: Payer: Medicare Other | Admitting: Pharmacist

## 2022-07-22 ENCOUNTER — Telehealth: Payer: Self-pay | Admitting: Pharmacist

## 2022-07-22 NOTE — Telephone Encounter (Signed)
Care Management & Coordination Services Outreach Note  07/22/2022 Name: Deborah Alvarado MRN: [id number] DOB: [birth year]  Referred by: [name], MD  Patient had a phone appointment scheduled with clinical pharmacist today.  An unsuccessful telephone outreach was attempted today. The patient was referred to the pharmacist for assistance with medications, care management and care coordination.   Patient will NOT be penalized in any way for missing a Care Management & Coordination Services appointment. The no-show fee does not apply.  If possible, a message was left to return call to: [phone] or to [facility].  Al Corpus, PharmD, BCACP Clinical Pharmacist [place] at [facility] [phone]

## 2022-07-22 NOTE — Telephone Encounter (Signed)
Patient returned call. See care coordination encounter.

## 2022-07-22 NOTE — Progress Notes (Signed)
Care Management & Coordination Services Pharmacy Note  07/22/2022 Name:  Deborah Alvarado MRN:  [id number] DOB:  [birth year]  Summary: F/U visit -DM: A1c 10.2% (05/2022) above goal and worsening (previously 9.3%); pt has started Lantus 5 units daily and per CGM data glucose control has improved; fasting BG typically 150-160s Reviewed AGP report: 07/09/22 to 07/22/22. Sensor active: 62%  Time in range (70-180): 26% (goal > 70%)  High (180-250): 49%  Very high (>250): 25%  Low (< 70): 0% (goal < 4%)  GMI: 8.5%; Average glucose: 219  Recommendations/Changes made from today's visit: -Advised to increase Lantus by 1 unit daily until fasting BG is < 150 consistently  Follow up plan: -Pharmacist follow up televisit scheduled for 1 month -PCP appt due ~08/23/22   Subjective: Deborah Alvarado is an 72 y.o. year old female who is a primary patient of Para March, Dwana Curd, MD.  The care coordination team was consulted for assistance with disease management and care coordination needs.    Engaged with patient by telephone for follow up visit.  Recent office visits: 05/23/22 Dr Para March OV: DM - A1c 10.2%; Start Lantus 5 units, goal fasting < 150. Recheck A1c 3 months.  04/30/22 NP Mayra Reel OV: Dysuria - Ucx positive, yeast infection positive. Rx Macrobid and Fluconazole.  02/15/22 Dr Para March OV: DM - A1c 9.3%. Pt declined insulin, will work on diet. F/u 3 months.   01/17/22 NP Matt Cable: UTI. Rx Macrobid.  Recent consult visits: 04/10/22 Podiatry - hammertoe deformity.   05/17/21 Dr Donald Prose (Endocrine): f/u DM. Pt reports A1c 6.3 per home health recently, unclear how accurate this is given A1c 8.8% in January and no changes since then. Advised to check sugar BID and call office in a week. F/U 3-4 months.   Hospital visits: None in previous 6 months   Objective:  Lab Results  Component Value Date   CREATININE 0.88 12/13/2021   BUN 12 12/13/2021   GFR 67.07 12/13/2021   GFRNONAA 68.49 01/02/2010    GFRAA 112 07/16/2007   NA 138 12/13/2021   K 4.4 12/13/2021   CALCIUM 9.7 12/13/2021   CO2 25 12/13/2021   GLUCOSE 191 (H) 12/13/2021    Lab Results  Component Value Date/Time   HGBA1C 10.2 (A) 05/23/2022 09:43 AM   HGBA1C 9.3 (H) 02/15/2022 09:54 AM   HGBA1C 8.8 (H) 12/13/2021 11:05 AM   HGBA1C 9.8 11/11/2019 12:00 AM   GFR 67.07 12/13/2021 11:05 AM   GFR 74.47 03/20/2021 11:44 AM   MICROALBUR 19.9 (H) 02/15/2022 11:52 AM   MICROALBUR 1.0 10/05/2008 09:54 AM    Last diabetic Eye exam:  Lab Results  Component Value Date/Time   HMDIABEYEEXA No Retinopathy 11/07/2021 12:00 AM    Last diabetic Foot exam:  Lab Results  Component Value Date/Time   HMDIABFOOTEX yes 12/15/2009 12:00 AM     Lab Results  Component Value Date   CHOL 141 12/13/2021   HDL 45.00 12/13/2021   LDLCALC 59 12/13/2021   LDLDIRECT 84.0 03/20/2021   TRIG 184.0 (H) 12/13/2021   CHOLHDL 3 12/13/2021       Latest Ref Rng & Units 12/13/2021   11:05 AM 03/20/2021   11:44 AM 12/29/2017   11:52 AM  Hepatic Function  Total Protein 6.0 - 8.3 g/dL 6.6  7.0  7.2   Albumin 3.5 - 5.2 g/dL 4.4  4.5  4.5   AST 0 - 37 U/L 21  28  21    ALT 0 - 35  U/L 25  32  30   Alk Phosphatase 39 - 117 U/L 88  78  96   Total Bilirubin 0.2 - 1.2 mg/dL 0.6  0.5  0.6     Lab Results  Component Value Date/Time   TSH 2.91 10/05/2008 09:54 AM   TSH 2.10 07/16/2007 09:40 AM       Latest Ref Rng & Units 10/05/2008    9:54 AM  CBC  WBC 4.5 - 10.5 10*3/microliter 5.2   Hemoglobin 12.0 - 15.0 g/dL 16.1   Hematocrit 09.6 - 46.0 % 40.6   Platelets 150.0 - 400.0 K/uL 220.0     Lab Results  Component Value Date/Time   VD25OH 48.29 12/13/2021 11:05 AM   VD25OH 45.56 03/20/2021 11:44 AM    Clinical ASCVD: No  The 10-year ASCVD risk score (Arnett DK, et al., 2019) is: 22.3%   Values used to calculate the score:     Age: 66 years     Sex: Female     Is Non-Hispanic African American: No     Diabetic: Yes     Tobacco  smoker: No     Systolic Blood Pressure: 138 mmHg     Is BP treated: Yes     HDL Cholesterol: 45 mg/dL     Total Cholesterol: 141 mg/dL        0/45/4098    1:19 AM 01/29/2022    8:14 AM 01/23/2021    1:28 PM  Depression screen PHQ 2/9  Decreased Interest 0 0 0  Down, Depressed, Hopeless 0 0 0  PHQ - 2 Score 0 0 0  Altered sleeping 0 0   Tired, decreased energy 1 0   Change in appetite 1 0   Feeling bad or failure about yourself  0 0   Trouble concentrating 0 0   Moving slowly or fidgety/restless 0 0   Suicidal thoughts 0 0   PHQ-9 Score 2 0   Difficult doing work/chores Not difficult at all Not difficult at all      Social History   Tobacco Use  Smoking Status Never  Smokeless Tobacco Never   BP Readings from Last 3 Encounters:  05/23/22 138/84  04/30/22 (!) 134/92  04/25/22 120/80   Pulse Readings from Last 3 Encounters:  05/23/22 91  04/30/22 80  04/10/22 (!) 104   Wt Readings from Last 3 Encounters:  05/23/22 160 lb (72.6 kg)  04/30/22 156 lb (70.8 kg)  02/15/22 156 lb (70.8 kg)   BMI Readings from Last 3 Encounters:  05/23/22 27.46 kg/m  04/30/22 26.78 kg/m  02/15/22 26.78 kg/m    Allergies  Allergen Reactions   Lisinopril     Cough, presumed   Metformin And Related Other (See Comments)    Diarrhea at >1000mg  a day    Medications Reviewed Today     Reviewed by Kathyrn Sheriff, [facility] (Pharmacist) on 07/22/22 at 1143  Med List Status: <None>   Medication Order Taking? Sig Documenting Provider Last Dose Status Informant  Cholecalciferol (VITAMIN D) 50 MCG (2000 UT) tablet [id number] Yes Take 1 tablet (2,000 Units total) by mouth daily. [name], MD Taking Active   Continuous Glucose Sensor (FREESTYLE LIBRE 3 [place]) [place] [id number] Yes Place 1 sensor on the skin every 14 days. Use to check glucose continuously [name], MD Taking Active   Dulaglutide (TRULICITY) 4.5 MG/0.5ML SOPN [id number] Yes Inject 4.5 mg as directed once a  week. [name], MD Taking Active  glimepiride (AMARYL) 4 MG tablet [id number] Yes Take 4 mg by mouth daily with breakfast. Sherlon Handing, MD Taking Active   insulin glargine (LANTUS SOLOSTAR) 100 UNIT/ML Solostar Pen [id number] Yes Inject 5-20 Units into the skin daily. [name], MD Taking Active   Insulin Pen Needle (PEN NEEDLES) 31G X 5 MM MISC [id number] Yes Use daily with insulin pen [name], MD Taking Active   losartan (COZAAR) 50 MG tablet [id number] Yes TAKE 1 TABLET BY MOUTH DAILY [name], MD Taking Active   metFORMIN (GLUCOPHAGE) 500 MG tablet [id number] Yes Take 1 tablet (500 mg total) by mouth 2 (two) times daily with a meal. [name], MD Taking Active   simvastatin (ZOCOR) 40 MG tablet [id number] Yes TAKE 1 TABLET BY MOUTH AT  BEDTIME [name], MD Taking Active             SDOH:  (Social Determinants of Health) assessments and interventions performed: No SDOH Interventions    Flowsheet Row Office Visit from 05/23/2022 in [facility] Clinical Support from 01/29/2022 in [facility] Chronic Care Management from 04/02/2021 in [facility] [place] [facility] [place] Telephone from 02/07/2021 in [facility] Care Coordination  SDOH Interventions      Food Insecurity Interventions -- Intervention Not Indicated -- --  Housing Interventions -- Intervention Not Indicated Intervention Not Indicated --  Transportation Interventions -- Intervention Not Indicated -- --  Utilities Interventions -- Intervention Not Indicated -- --  Alcohol Usage Interventions -- Intervention Not Indicated (Score <7) -- --  Depression Interventions/Treatment  Counseling -- -- --  Financial Strain Interventions -- Intervention Not Indicated Other (Comment)  [Patient assistance] Other (Comment)  [patient refered to Pharmacy consult no other financial needs in the donut  hole presently]  Physical Activity Interventions -- Intervention Not Indicated -- --  Stress Interventions -- Intervention Not Indicated -- --  Social Connections Interventions -- Intervention Not Indicated -- --       Medication Assistance:  Trulicity [employer] Cares 2024 approved  Medication Access: Within the past 30 days, how often has patient missed a dose of medication? 0 Is a pillbox or other method used to improve adherence? Yes  Factors that may affect medication adherence? no barriers identified Are meds synced by current pharmacy? No  Are meds delivered by current pharmacy? Yes  Does patient experience delays in picking up medications due to transportation concerns? No   [facility] Services Reviewed: Is patient disadvantaged to use [facility] Pharmacy?: Yes  Current Rx insurance plan: [facility] Name and location of Current pharmacy:  [id number] - [place], [place] - [address] [place] [address] Phone: [phone] Fax: [phone]  [facility] - [place], [place] - [address] [place] [address] Phone: [phone] Fax: [phone]  [facility] Pharm[facility] services reviewed with patient today?: No  Patient requests to transfer care to [facility] Pharmacy?: No  Reason patient declined to change pharmacies: Disadvantaged due to insurance/mail order  Compliance/Adherence/Medication fill history: Care Gaps: None  Star-Rating Drugs: Glimepiride - PDC 100% Losartan -PDC 100% Metformin - PDC 100% Simvastatin - PDC 100% Trulicity - PAP   ASSESSMENT / PLAN  Diabetes (A1c goal <7%) -Uncontrolled, improving - A1c 10.2% (05/2022) worsened from 9.3%; pt reports she is taking Lantus 5 units; she is wearing Freestyle Libre now and report shows  improvement in DM control compared to last A1c -she has transitioned from NP [facility] to Dr Donald Prose at [facility] but is not following with them right now due  to scheduling conflicts -Family history: both grandfathers lost legs due to DM -Current home glucose readings:  Reviewed AGP report: 07/09/22 to 07/22/22. Sensor active: 62%  Time in range (70-180): 26% (goal > 70%)  High (180-250): 49%  Very high (>250): 25%  Low (< 70): 0% (goal < 4%)  GMI: 8.5%; Average glucose: 219  -Current medications: Metformin 500 mg - 1 tab BID - Appropriate, Query Effective Trulicity 4.5 mg weekly (PAP) - Appropriate, Query Effective Lantus 5 units daily - Appropriate, Query Effective Glimepiride 4 mg BID (started ~2012) -Appropriate, Query Effective Testing supplies -Medications previously tried: Jardiance (yeast infections, cost), metformin 2g/day (diarrhea) -Reviewed CGM report which shows improved control compared to last A1c; fasting sugar is rarely < 150, advised to increase Lantus by 1 unit daily until fasting BG consistently <150  Hypertension (BP goal <130/80) -Controlled - BP was elevated in recent OV, but per pt report BP has been normal at home -Current home readings: 120/80 -Current treatment: Losartan 50 mg daily - Appropriate, Effective, Safe, Accessible -Medications previously tried: lisinopril (cough) -Current exercise habits: Stopped going to gym in 2020 after COVID restrictions. She gets a free membership with insurance and she would like to get back into working out. She has 3 acres which keeps her active in the Spring/Summer. We discussed importance of daily exercise, starting with 10-15 minute increments for heart health. -Recommended to continue current medication  Hyperlipidemia: (LDL goal < 70) -Controlled- LDL 59 (12/2021) at goal -ASCVD risk 17% (moderate) -Current treatment: Simvastatin 40 mg daily - Appropriate, Effective, Safe, Accessible -Medications previously tried: none - pt denies previous trials of statins or intolerances -Educated on Cholesterol goals;  -Recommended to continue current medication  Osteopenia (Goal:  prevent fractures) -Controlled -She fell a couple years ago on ice. No falls since then. She tries to stay very cautious. Denies any history of hip fractures. -Last DEXA Scan: 03/07/21  T-Score femoral neck: -1.5   T-Score forearm radius: Normal 0.3  10-year probability of major osteoporotic fracture: 15.1%  10-year probability of hip fracture: 1.8% -Patient is not a candidate for pharmacologic treatment -Current treatment  Vitamin D 2000 IU daily - Appropriate, Effective, Safe, Accessible -Medications previously tried: none   -Recommend [phone] units of vitamin D daily. Recommend 1200 mg of calcium daily from dietary and supplemental sources. Discussed calcium in diet.  -Recommended to continue current medication   Al Corpus, PharmD, BCACP Clinical Pharmacist [facility] [phone]

## 2022-08-09 ENCOUNTER — Telehealth: Payer: Self-pay | Admitting: *Deleted

## 2022-08-09 NOTE — Progress Notes (Signed)
  Care Coordination  Outreach Note  08/09/2022 Name: Deborah Alvarado MRN: [id number] DOB: March 11, 1951   Care Coordination Outreach Attempts: An unsuccessful telephone outreach was attempted today to offer the patient information about available care coordination services.  Follow Up Plan:  Additional outreach attempts will be made to offer the patient care coordination information and services.   Encounter Outcome:  No Answer  Burman Nieves, CCMA Care Coordination Care Guide Direct Dial: [phone]

## 2022-08-19 NOTE — Progress Notes (Signed)
  Care Coordination  Outreach Note  08/19/2022 Name: Deborah Alvarado MRN: [id number] DOB: [birth year]   Care Coordination Outreach Attempts: A second unsuccessful outreach was attempted today to offer the patient with information about available care coordination services.  Follow Up Plan:  Additional outreach attempts will be made to offer the patient care coordination information and services.   Encounter Outcome:  No Answer  Burman Nieves, CCMA Care Coordination Care Guide Direct Dial: [phone]

## 2022-08-20 ENCOUNTER — Telehealth: Payer: Self-pay

## 2022-08-20 NOTE — Progress Notes (Signed)
Care Management & Coordination Services Pharmacy Team  Reason for Encounter: Appointment Reminder  Contacted patient to confirm telephone appointment with Al Corpus, PharmD on 08/21/2022 at 3:00.  Unsuccessful outreach. Left voicemail for patient to return call.  Al Corpus, PharmD notified  Claudina Lick, [place] Clinical Pharmacy Assistant [phone]

## 2022-08-21 ENCOUNTER — Encounter: Payer: Medicare Other | Admitting: Pharmacist

## 2022-08-21 NOTE — Telephone Encounter (Signed)
Patient returned miss call back,would ike a return call.

## 2022-08-21 NOTE — Telephone Encounter (Signed)
Care Management & Coordination Services Outreach Note  08/21/2022 Name: Deborah Alvarado MRN: [id number] DOB: 04-20-1950  Referred by: [name], MD  Patient had a phone appointment scheduled with clinical pharmacist today.  An unsuccessful telephone outreach was attempted today. The patient was referred to the pharmacist for assistance with medications, care management and care coordination.   Patient will NOT be penalized in any way for missing a Care Management & Coordination Services appointment. The no-show fee does not apply.  If possible, a message was left to return call to: [phone] or to [facility].  Al Corpus, PharmD, BCACP Clinical Pharmacist [facility] [phone]

## 2022-08-21 NOTE — Progress Notes (Unsigned)
Care Management & Coordination Services Pharmacy Note  08/21/2022 Name:  Deborah Alvarado MRN:  [id number] DOB:  Oct 11, 1950  Summary: F/U visit -DM: A1c 10.2% (05/2022) above goal and worsening (previously 9.3%); pt has started Lantus 5 units daily and per CGM data glucose control has improved; fasting BG typically 150-160s Reviewed AGP report: 07/09/22 to 07/22/22. Sensor active: 62%  Time in range (70-180): 26% (goal > 70%)  High (180-250): 49%  Very high (>250): 25%  Low (< 70): 0% (goal < 4%)  GMI: 8.5%; Average glucose: 219  Recommendations/Changes made from today's visit: -Advised to increase Lantus by 1 unit daily until fasting BG is < 150 consistently  Follow up plan: -Pharmacist follow up televisit scheduled for 1 month -PCP appt due ~08/23/22   Subjective: Deborah Alvarado is an 72 y.o. year old female who is a primary patient of Para March, Dwana Curd, MD.  The care coordination team was consulted for assistance with disease management and care coordination needs.    Engaged with patient by telephone for follow up visit.  Recent office visits: 05/23/22 Dr Para March OV: DM - A1c 10.2%; Start Lantus 5 units, goal fasting < 150. Recheck A1c 3 months.  04/30/22 NP Mayra Reel OV: Dysuria - Ucx positive, yeast infection positive. Rx Macrobid and Fluconazole.  02/15/22 Dr Para March OV: DM - A1c 9.3%. Pt declined insulin, will work on diet. F/u 3 months.   01/17/22 NP Matt Cable: UTI. Rx Macrobid.  Recent consult visits: 04/10/22 Podiatry - hammertoe deformity.   05/17/21 Dr Donald Prose (Endocrine): f/u DM. Pt reports A1c 6.3 per home health recently, unclear how accurate this is given A1c 8.8% in January and no changes since then. Advised to check sugar BID and call office in a week. F/U 3-4 months.   Hospital visits: None in previous 6 months   Objective:  Lab Results  Component Value Date   CREATININE 0.88 12/13/2021   BUN 12 12/13/2021   GFR 67.07 12/13/2021   GFRNONAA 68.49 01/02/2010    GFRAA 112 07/16/2007   NA 138 12/13/2021   K 4.4 12/13/2021   CALCIUM 9.7 12/13/2021   CO2 25 12/13/2021   GLUCOSE 191 (H) 12/13/2021    Lab Results  Component Value Date/Time   HGBA1C 10.2 (A) 05/23/2022 09:43 AM   HGBA1C 9.3 (H) 02/15/2022 09:54 AM   HGBA1C 8.8 (H) 12/13/2021 11:05 AM   HGBA1C 9.8 11/11/2019 12:00 AM   GFR 67.07 12/13/2021 11:05 AM   GFR 74.47 03/20/2021 11:44 AM   MICROALBUR 19.9 (H) 02/15/2022 11:52 AM   MICROALBUR 1.0 10/05/2008 09:54 AM    Last diabetic Eye exam:  Lab Results  Component Value Date/Time   HMDIABEYEEXA No Retinopathy 11/07/2021 12:00 AM    Last diabetic Foot exam:  Lab Results  Component Value Date/Time   HMDIABFOOTEX yes 12/15/2009 12:00 AM     Lab Results  Component Value Date   CHOL 141 12/13/2021   HDL 45.00 12/13/2021   LDLCALC 59 12/13/2021   LDLDIRECT 84.0 03/20/2021   TRIG 184.0 (H) 12/13/2021   CHOLHDL 3 12/13/2021       Latest Ref Rng & Units 12/13/2021   11:05 AM 03/20/2021   11:44 AM 12/29/2017   11:52 AM  Hepatic Function  Total Protein 6.0 - 8.3 g/dL 6.6  7.0  7.2   Albumin 3.5 - 5.2 g/dL 4.4  4.5  4.5   AST 0 - 37 U/L 21  28  21    ALT 0 - 35  U/L 25  32  30   Alk Phosphatase 39 - 117 U/L 88  78  96   Total Bilirubin 0.2 - 1.2 mg/dL 0.6  0.5  0.6     Lab Results  Component Value Date/Time   TSH 2.91 10/05/2008 09:54 AM   TSH 2.10 07/16/2007 09:40 AM       Latest Ref Rng & Units 10/05/2008    9:54 AM  CBC  WBC 4.5 - 10.5 10*3/microliter 5.2   Hemoglobin 12.0 - 15.0 g/dL 16.1   Hematocrit 09.6 - 46.0 % 40.6   Platelets 150.0 - 400.0 K/uL 220.0     Lab Results  Component Value Date/Time   VD25OH 48.29 12/13/2021 11:05 AM   VD25OH 45.56 03/20/2021 11:44 AM    Clinical ASCVD: No  The 10-year ASCVD risk score (Arnett DK, et al., 2019) is: 24.7%   Values used to calculate the score:     Age: 4 years     Sex: Female     Is Non-Hispanic African American: No     Diabetic: Yes     Tobacco  smoker: No     Systolic Blood Pressure: 138 mmHg     Is BP treated: Yes     HDL Cholesterol: 45 mg/dL     Total Cholesterol: 141 mg/dL        0/45/4098    1:19 AM 01/29/2022    8:14 AM 01/23/2021    1:28 PM  Depression screen PHQ 2/9  Decreased Interest 0 0 0  Down, Depressed, Hopeless 0 0 0  PHQ - 2 Score 0 0 0  Altered sleeping 0 0   Tired, decreased energy 1 0   Change in appetite 1 0   Feeling bad or failure about yourself  0 0   Trouble concentrating 0 0   Moving slowly or fidgety/restless 0 0   Suicidal thoughts 0 0   PHQ-9 Score 2 0   Difficult doing work/chores Not difficult at all Not difficult at all      Social History   Tobacco Use  Smoking Status Never  Smokeless Tobacco Never   BP Readings from Last 3 Encounters:  05/23/22 138/84  04/30/22 (!) 134/92  04/25/22 120/80   Pulse Readings from Last 3 Encounters:  05/23/22 91  04/30/22 80  04/10/22 (!) 104   Wt Readings from Last 3 Encounters:  05/23/22 160 lb (72.6 kg)  04/30/22 156 lb (70.8 kg)  02/15/22 156 lb (70.8 kg)   BMI Readings from Last 3 Encounters:  05/23/22 27.46 kg/m  04/30/22 26.78 kg/m  02/15/22 26.78 kg/m    Allergies  Allergen Reactions   Lisinopril     Cough, presumed   Metformin And Related Other (See Comments)    Diarrhea at >1000mg  a day    Medications Reviewed Today     Reviewed by Kathyrn Sheriff, [facility] (Pharmacist) on 07/22/22 at 1143  Med List Status: <None>   Medication Order Taking? Sig Documenting Provider Last Dose Status Informant  Cholecalciferol (VITAMIN D) 50 MCG (2000 UT) tablet [id number] Yes Take 1 tablet (2,000 Units total) by mouth daily. [name], MD Taking Active   Continuous Glucose Sensor (FREESTYLE LIBRE 3 [place]) [place] [id number] Yes Place 1 sensor on the skin every 14 days. Use to check glucose continuously [name], MD Taking Active   Dulaglutide (TRULICITY) 4.5 MG/0.5ML SOPN [id number] Yes Inject 4.5 mg as directed once a  week. [name], MD Taking Active  glimepiride (AMARYL) 4 MG tablet [id number] Yes Take 4 mg by mouth daily with breakfast. Sherlon Handing, MD Taking Active   insulin glargine (LANTUS SOLOSTAR) 100 UNIT/ML Solostar Pen [id number] Yes Inject 5-20 Units into the skin daily. [name], MD Taking Active   Insulin Pen Needle (PEN NEEDLES) 31G X 5 MM MISC [id number] Yes Use daily with insulin pen [name], MD Taking Active   losartan (COZAAR) 50 MG tablet [id number] Yes TAKE 1 TABLET BY MOUTH DAILY [name], MD Taking Active   metFORMIN (GLUCOPHAGE) 500 MG tablet [id number] Yes Take 1 tablet (500 mg total) by mouth 2 (two) times daily with a meal. [name], MD Taking Active   simvastatin (ZOCOR) 40 MG tablet [id number] Yes TAKE 1 TABLET BY MOUTH AT  BEDTIME [name], MD Taking Active             SDOH:  (Social Determinants of Health) assessments and interventions performed: No SDOH Interventions    Flowsheet Row Office Visit from 05/23/2022 in [facility] Clinical Support from 01/29/2022 in [facility] Chronic Care Management from 04/02/2021 in [facility] [place] Telephone from 02/07/2021 in [facility] Care Coordination  SDOH Interventions      Food Insecurity Interventions -- Intervention Not Indicated -- --  Housing Interventions -- Intervention Not Indicated Intervention Not Indicated --  Transportation Interventions -- Intervention Not Indicated -- --  Utilities Interventions -- Intervention Not Indicated -- --  Alcohol Usage Interventions -- Intervention Not Indicated (Score <7) -- --  Depression Interventions/Treatment  Counseling -- -- --  Financial Strain Interventions -- Intervention Not Indicated Other (Comment)  [Patient assistance] Other (Comment)  [patient refered to Pharmacy consult no other financial needs in the donut  hole presently]  Physical Activity Interventions -- Intervention Not Indicated -- --  Stress Interventions -- Intervention Not Indicated -- --  Social Connections Interventions -- Intervention Not Indicated -- --       Medication Assistance:  Trulicity [employer] Cares 2024 approved  Medication Access: Within the past 30 days, how often has patient missed a dose of medication? 0 Is a pillbox or other method used to improve adherence? Yes  Factors that may affect medication adherence? no barriers identified Are meds synced by current pharmacy? No  Are meds delivered by current pharmacy? Yes  Does patient experience delays in picking up medications due to transportation concerns? No   [facility] Services Reviewed: Is patient disadvantaged to use [facility]?: Yes  Current Rx insurance plan: [facility] Name and location of Current pharmacy:  [id number] - [place], [place] - [address] [place] [address] Phone: [phone] Fax: [phone]  [facility] - [place], [place] - [address] [place] [address] Phone: [phone] Fax: [phone]  [facility] services reviewed with patient today?: No  Patient requests to transfer care to [facility] Pharmacy?: No  Reason patient declined to change pharmacies: Disadvantaged due to insurance/mail order  Compliance/Adherence/Medication fill history: Care Gaps: None  Star-Rating Drugs: Glimepiride - PDC 100% Losartan -PDC 100% Metformin - PDC 100% Simvastatin - PDC 100% Trulicity - PAP   ASSESSMENT / PLAN  Diabetes (A1c goal <7%) -Uncontrolled- A1c 10.2% (05/2022) worsened from 9.3%; pt reports she is taking Lantus 5 units; she is wearing Freestyle Libre now and report shows improvement in  DM control compared to last A1c -she has transitioned from NP [facility] to Dr Donald Prose at [facility] but is not following with them right now due to scheduling  conflicts -Family history: both grandfathers lost legs due to DM -Current home glucose readings:  Reviewed AGP report: 08/08/22 to 08/21/22. Sensor active: 85%  Time in range (70-180): 8% (goal > 70%)  High (180-250): 47%  Very high (>250): 45%  Low (< 70): 0% (goal < 4%)  GMI: 9.3%; Average glucose: 250  Previous AGP report: 07/09/22 to 07/22/22. Sensor active: 62%  Time in range (70-180): 26% (goal > 70%)  High (180-250): 49%  Very high (>250): 25%  Low (< 70): 0% (goal < 4%)  GMI: 8.5%; Average glucose: 219  -Current medications: Metformin 500 mg - 1 tab BID - Appropriate, Query Effective Trulicity 4.5 mg weekly (PAP) - Appropriate, Query Effective Lantus 5 units daily - Appropriate, Query Effective Glimepiride 4 mg BID (started ~2012) -Appropriate, Query Effective Testing supplies -Medications previously tried: Jardiance (yeast infections, cost), metformin 2g/day (diarrhea) -Reviewed CGM report which shows improved control compared to last A1c; fasting sugar is rarely < 150, advised to increase Lantus by 1 unit daily until fasting BG consistently <150  Hypertension (BP goal <130/80) -Controlled - BP was elevated in recent OV, but per pt report BP has been normal at home -Current home readings: 120/80 -Current treatment: Losartan 50 mg daily - Appropriate, Effective, Safe, Accessible -Medications previously tried: lisinopril (cough) -Current exercise habits: Stopped going to gym in 2020 after COVID restrictions. She gets a free membership with insurance and she would like to get back into working out. She has 3 acres which keeps her active in the Spring/Summer. We discussed importance of daily exercise, starting with 10-15 minute increments for heart health. -Recommended to continue current medication  Hyperlipidemia: (LDL goal < 70) -Controlled- LDL 59 (12/2021) at goal -ASCVD risk 17% (moderate) -Current treatment: Simvastatin 40 mg daily - Appropriate, Effective, Safe,  Accessible -Medications previously tried: none - pt denies previous trials of statins or intolerances -Educated on Cholesterol goals;  -Recommended to continue current medication  Osteopenia (Goal: prevent fractures) -Controlled -She fell a couple years ago on ice. No falls since then. She tries to stay very cautious. Denies any history of hip fractures. -Last DEXA Scan: 03/07/21  T-Score femoral neck: -1.5   T-Score forearm radius: Normal 0.3  10-year probability of major osteoporotic fracture: 15.1%  10-year probability of hip fracture: 1.8% -Patient is not a candidate for pharmacologic treatment -Current treatment  Vitamin D 2000 IU daily - Appropriate, Effective, Safe, Accessible -Medications previously tried: none   -Recommend [phone] units of vitamin D daily. Recommend 1200 mg of calcium daily from dietary and supplemental sources. Discussed calcium in diet.  -Recommended to continue current medication   Al Corpus, PharmD, BCACP Clinical Pharmacist [facility] [phone]

## 2022-08-28 NOTE — Progress Notes (Signed)
  Care Coordination   Note   08/28/2022 Name: Deborah Alvarado MRN: [id number] DOB: 1950/12/22  Deborah Alvarado is a 72 y.o. year old female who sees [name], MD for primary care. I reached out to [name] by phone today to offer care coordination services.  [name] was given information about Care Coordination services today including:   The Care Coordination services include support from the care team which includes your Nurse Coordinator, Clinical Social Worker, or Pharmacist.  The Care Coordination team is here to help remove barriers to the health concerns and goals most important to you. Care Coordination services are voluntary, and the patient may decline or stop services at any time by request to their care team member.   Care Coordination Consent Status: Patient agreed to services and verbal consent obtained.   Follow up plan:  Telephone appointment with care coordination team member scheduled for:  08/29/2022  Encounter Outcome:  Pt. Scheduled   Burman Nieves, CCMA Care Coordination Care Guide Direct Dial: [phone]

## 2022-08-29 ENCOUNTER — Other Ambulatory Visit: Payer: Medicare Other | Admitting: Pharmacist

## 2022-08-29 ENCOUNTER — Ambulatory Visit: Payer: Self-pay

## 2022-08-29 NOTE — Patient Instructions (Signed)
Visit Information  Thank you for taking time to visit with me today. Please don't hesitate to contact me if I can be of assistance to you.   Following are the goals we discussed today:  Review education articles on diabetes sent to you in Mychart Continue to monitor blood sugars daily reporting frequent low blood sugar less than 70 or greater than 250.  Continue to follow a low carbohydrate diet   Our next appointment is by telephone on 10/01/22 at 10am  Please call the care guide team at [phone] if you need to cancel or reschedule your appointment.   If you are experiencing a Mental Health or Behavioral Health Crisis or need someone to talk to, please call the Suicide and Crisis Lifeline: 988 call 1-800-273-TALK (toll free, 24 hour hotline)  Patient verbalizes understanding of instructions and care plan provided today and agrees to view in MyChart. Active MyChart status and patient understanding of how to access instructions and care plan via MyChart confirmed with patient.     [name] RN,BSN,CCM [facility] Care Coordination [phone] direct line  Type 2 Diabetes Mellitus, Self-Care, Adult When you have type 2 diabetes (type 2 diabetes mellitus), you must make sure your blood sugar (glucose) stays in a healthy range. You can do this with: Nutrition. Exercise. Lifestyle changes. Medicines or insulin, if needed. Support from your doctors and others. What are the risks? Having type 2 diabetes can raise your risk for other long-term (chronic) health problems. You may get medicines to help prevent these problems. How to stay aware of your blood sugar  Check your blood sugar level every day, as often as told. Have your A1C (hemoglobin A1C) level checked two or more times a year. Have it checked more often if told. Your doctor will set personal treatment goals for you. In general, you should have these blood sugar levels: Before meals: 80-130 mg/dL (2.4-4.0 mmol/L). After meals:  below 180 mg/dL (10 mmol/L). A1C: less than 7%. How to manage high and low blood sugar Symptoms of high blood sugar High blood sugar is also called hyperglycemia. Know the symptoms of high blood sugar. These may include: More thirst. Hunger. Feeling very tired. Needing to pee (urinate) more often than normal. Seeing things blurry. Symptoms of low blood sugar Low blood sugar is also called hypoglycemia. This is when blood sugar is at or below 70 mg/dL (3.9 mmol/L). Symptoms may include: Hunger. Feeling worried or nervous (anxious). Feeling sweaty and cold to the touch (clammy). Being dizzy or light-headed. Feeling sleepy. A fast heartbeat. Feeling grouchy (irritable). Tingling or loss of feeling (numbness) around your mouth, lips, or tongue. Restless sleep. Diabetes medicines can cause low blood sugar. You are more at risk: While you exercise. After exercise. During sleep. When you are sick. When you skip meals or do not eat for a long time. Treating low blood sugar If you think you have low blood sugar, eat or drink something sugary right away. Keep 15 grams of a fast-acting carb (carbohydrate) with you all the time. Make sure your family and friends know how to treat you if you cannot treat yourself. Treating very low blood sugar Severe hypoglycemia is when your blood sugar is at or below 54 mg/dL (3 mmol/L). Severe hypoglycemia is an emergency. Get medical help right away. Call your local emergency services (911 in the [place] Do not wait to see if the symptoms will go away. Do not drive yourself to the hospital. You may need a glucagon shot  if you have very low blood sugar and you cannot eat or drink. Have a family member or friend learn how to check your blood sugar and how to give you a glucagon shot. Ask your doctor if you should have a kit for glucagon shots. Follow these instructions at home: Medicines Take prescribed insulin or diabetes medicines as told by your health  care provider. Do not run out of insulin or other medicines. Plan ahead. If you use insulin, change the amount you take based on how active you are and what foods you eat. Your doctor will tell you how to do this. Take over-the-counter and prescription medicines only as told by your doctor. Eating and drinking  Eat healthy foods. These include: Low-fat (lean) proteins. Complex carbs, such as whole grains. Fresh fruits and vegetables. Low-fat dairy products. Healthy fats. Meet with a food expert (dietitian) to make an eating plan. Follow instructions from your doctor about what you cannot eat or drink. Drink enough fluid to keep your pee (urine) pale yellow. Keep track of carbs that you eat. Read food labels and learn serving sizes of foods. Follow your sick-day plan when you cannot eat or drink as normal. Make this plan with your doctor so it is ready to use. Activity Exercise as told by your doctor. You may need to: Do stretching and strength exercises two or more times a week. Do 150 minutes or more of exercise each week that makes your heart beat faster and makes you sweat. Spread out your exercise over 3 or more days a week. Do not go more than 2 days in a row without exercise. Talk with your doctor before you start a new exercise. Your doctor may tell you to change: How much insulin or medicines you take. How much food you eat. Lifestyle Do not smoke or use any products that contain nicotine or tobacco. If you need help quitting, ask your doctor. If you drink alcohol and your doctor says that it is safe for you: Limit how much you have to: 0-1 drink a day for women who are not pregnant. 0-2 drinks a day for men. Know how much alcohol is in your drink. In the [place] one drink equals one 12 oz bottle of beer (355 mL), one 5 oz glass of wine (148 mL), or one 1 oz glass of hard liquor (44 mL). Learn to deal with stress. If you need help, ask your doctor. Body care  Stay up to  date with your shots (immunizations). Have your eyes and feet checked by a doctor as often as told. Check your skin and feet every day. Check for cuts, bruises, redness, blisters, or sores. Brush your teeth and gums two times a day. Floss one or more times a day. Go to the dentist one or more times every 6 months. Stay at a healthy weight. General instructions Share your diabetes care plan with: Your work or school. People you live with. Carry a card or wear jewelry that says you have diabetes. Keep all follow-up visits. Questions to ask your doctor Do I need to meet with a certified expert in diabetes education and care? Where can I find a support group? Where to find more information For help and guidance and more information about diabetes, please go to: American Diabetes Association: www.diabetes.org American Association of Diabetes Care and Education Specialists: www.diabeteseducator.org International Diabetes Federation: DCOnly.dk Summary When you have type 2 diabetes, you must make sure your blood sugar (glucose) stays in a  healthy range. You can do this with nutrition, exercise, medicines and insulin, and support from doctors and others. Check your blood sugar every day, or as often as told. Having diabetes can raise your risk for other long-term health problems. You may get medicines to help prevent these problems. Share your diabetes management plan with people at work, school, and home. Keep all follow-up visits. This information is not intended to replace advice given to you by your health care provider. Make sure you discuss any questions you have with your health care provider. Document Revised: 05/22/2020 Document Reviewed: 05/22/2020 Elsevier Patient Education  2024 Elsevier Inc.   Diabetes Mellitus and Nutrition, Adult When you have diabetes, or diabetes mellitus, it is very important to have healthy eating habits because your blood sugar (glucose) levels are  greatly affected by what you eat and drink. Eating healthy foods in the right amounts, at about the same times every day, can help you: Manage your blood glucose. Lower your risk of heart disease. Improve your blood pressure. Reach or maintain a healthy weight. What can affect my meal plan? Every person with diabetes is different, and each person has different needs for a meal plan. Your health care provider may recommend that you work with a dietitian to make a meal plan that is best for you. Your meal plan may vary depending on factors such as: The calories you need. The medicines you take. Your weight. Your blood glucose, blood pressure, and cholesterol levels. Your activity level. Other health conditions you have, such as heart or kidney disease. How do carbohydrates affect me? Carbohydrates, also called carbs, affect your blood glucose level more than any other type of food. Eating carbs raises the amount of glucose in your blood. It is important to know how many carbs you can safely have in each meal. This is different for every person. Your dietitian can help you calculate how many carbs you should have at each meal and for each snack. How does alcohol affect me? Alcohol can cause a decrease in blood glucose (hypoglycemia), especially if you use insulin or take certain diabetes medicines by mouth. Hypoglycemia can be a life-threatening condition. Symptoms of hypoglycemia, such as sleepiness, dizziness, and confusion, are similar to symptoms of having too much alcohol. Do not drink alcohol if: Your health care provider tells you not to drink. You are pregnant, may be pregnant, or are planning to become pregnant. If you drink alcohol: Limit how much you have to: 0-1 drink a day for women. 0-2 drinks a day for men. Know how much alcohol is in your drink. In the [place] one drink equals one 12 oz bottle of beer (355 mL), one 5 oz glass of wine (148 mL), or one 1 oz glass of hard liquor (44  mL). Keep yourself hydrated with water, diet soda, or unsweetened iced tea. Keep in mind that regular soda, juice, and other mixers may contain a lot of sugar and must be counted as carbs. What are tips for following this plan?  Reading food labels Start by checking the serving size on the Nutrition Facts label of packaged foods and drinks. The number of calories and the amount of carbs, fats, and other nutrients listed on the label are based on one serving of the item. Many items contain more than one serving per package. Check the total grams (g) of carbs in one serving. Check the number of grams of saturated fats and trans fats in one serving. Choose foods that  have a low amount or none of these fats. Check the number of milligrams (mg) of salt (sodium) in one serving. Most people should limit total sodium intake to less than 2,300 mg per day. Always check the nutrition information of foods labeled as "low-fat" or "nonfat." These foods may be higher in added sugar or refined carbs and should be avoided. Talk to your dietitian to identify your daily goals for nutrients listed on the label. Shopping Avoid buying canned, pre-made, or processed foods. These foods tend to be high in fat, sodium, and added sugar. Shop around the outside edge of the grocery store. This is where you will most often find fresh fruits and vegetables, bulk grains, fresh meats, and fresh dairy products. Cooking Use low-heat cooking methods, such as baking, instead of high-heat cooking methods, such as deep frying. Cook using healthy oils, such as olive, canola, or sunflower oil. Avoid cooking with butter, cream, or high-fat meats. Meal planning Eat meals and snacks regularly, preferably at the same times every day. Avoid going long periods of time without eating. Eat foods that are high in fiber, such as fresh fruits, vegetables, beans, and whole grains. Eat 4-6 oz (112-168 g) of lean protein each day, such as lean meat,  chicken, fish, eggs, or tofu. One ounce (oz) (28 g) of lean protein is equal to: 1 oz (28 g) of meat, chicken, or fish. 1 egg.  cup (62 g) of tofu. Eat some foods each day that contain healthy fats, such as avocado, nuts, seeds, and fish. What foods should I eat? Fruits Berries. Apples. Oranges. Peaches. Apricots. Plums. Grapes. Mangoes. Papayas. Pomegranates. Kiwi. Cherries. Vegetables Leafy greens, including lettuce, spinach, kale, chard, collard greens, mustard greens, and cabbage. Beets. Cauliflower. Broccoli. Carrots. Truc [place] beans. Tomatoes. Peppers. Onions. Cucumbers. Brussels sprouts. Grains Whole grains, such as whole-wheat or whole-grain bread, crackers, tortillas, cereal, and pasta. Unsweetened oatmeal. Quinoa. Brown or wild rice. Meats and other proteins Seafood. Poultry without skin. Lean cuts of poultry and beef. Tofu. Nuts. Seeds. Dairy Low-fat or fat-free dairy products such as milk, yogurt, and cheese. The items listed above may not be a complete list of foods and beverages you can eat and drink. Contact a dietitian for more information. What foods should I avoid? Fruits Fruits canned with syrup. Vegetables Canned vegetables. Frozen vegetables with butter or cream sauce. Grains Refined white flour and flour products such as bread, pasta, snack foods, and cereals. Avoid all processed foods. Meats and other proteins Fatty cuts of meat. Poultry with skin. Breaded or fried meats. Processed meat. Avoid saturated fats. Dairy Full-fat yogurt, cheese, or milk. Beverages Sweetened drinks, such as soda or iced tea. The items listed above may not be a complete list of foods and beverages you should avoid. Contact a dietitian for more information. Questions to ask a health care provider Do I need to meet with a certified diabetes care and education specialist? Do I need to meet with a dietitian? What number can I call if I have questions? When are the best times to check my  blood glucose? Where to find more information: American Diabetes Association: diabetes.org Academy of Nutrition and Dietetics: eatright.[employer] of Diabetes and Digestive and Kidney Diseases: StageSync.si Association of Diabetes Care & Education Specialists: diabeteseducator.org Summary It is important to have healthy eating habits because your blood sugar (glucose) levels are greatly affected by what you eat and drink. It is important to use alcohol carefully. A healthy meal plan will help you manage  your blood glucose and lower your risk of heart disease. Your health care provider may recommend that you work with a dietitian to make a meal plan that is best for you. This information is not intended to replace advice given to you by your health care provider. Make sure you discuss any questions you have with your health care provider. Document Revised: 09/29/2019 Document Reviewed: 09/29/2019 Elsevier Patient Education  2024 [employer].

## 2022-08-29 NOTE — Progress Notes (Cosign Needed Addendum)
Care Coordination Call  Contacted patient to reschedule appointment with pharmacist. She noted concerns with elevated blood sugars   Current medications: Trulicity 4.5 mg weekly, glimepiride 4 mg daily, metformin 500 mg twice daily, Lantus, currently at 7 units daily. She was previously advised by Mardella Layman to increase by 1 unit every day until fasting readings are <150, however, she has not done that.   Date of Download: 6/7-6/20/24 % Time CGM is active: 888% Average Glucose: 229 mg/dL Glucose Management Indicator: 8.8  Glucose Variability: 24.7 (goal <36%) Time in Goal:  - Time in range 70-180: 19% - Time above range: 71% - Time below range: 0%  Recommended she increase Lantus to 10 units daily as per currently existing prescription written by Dr. Para March. Continue Trulicity 4.5 mg weekly, metformin 500 mg twice daily, glimepiride 4 mg daily for now. Could consider trial of extended release metformin moving forward to see if able to increase dose.   She will update me next week on how she is doing.   Catie Eppie Gibson, PharmD, BCACP, CPP Clinical Pharmacist [facility] [phone]

## 2022-08-29 NOTE — Patient Outreach (Signed)
  Care Coordination   Initial Visit Note   08/29/2022 Name: SAMANDA DELKER MRN: [id number] DOB: [birth year]  THERESSA HAECKER is a 72 y.o. year old female who sees [name], MD for primary care. I spoke with  [name] by phone today.  What matters to the patients health and wellness today?  Patient states current Hgb A1c is 10.2.  She reports adjustment with Lantas today by pharmacist to 10 units.  She states she continues to use her sliding scale and recommended.  Patient reports having CGM, Free style libre.  She reports blood sugars range from 100's-300's.  Patient states next follow up with PCP is 01/2022.    Goals Addressed             This Visit's Progress    education and management of diabetes with decrease in Hgb A1c by 2 points       Interventions Today    Flowsheet Row Most Recent Value  Chronic Disease   Chronic disease during today's visit Diabetes  General Interventions   General Interventions Discussed/Reviewed General Interventions Discussed, Labs, Doctor Visits, Annual Eye Exam, Annual Foot Exam  [evaluation of current treatment plan for diabetes and patients adherence to plan as established by provider. Assessed blood sugar readings.]  Labs Hgb A1c every 6 months  Doctor Visits Discussed/Reviewed Doctor Visits Discussed  [reviewed scheduled/ upcoming provider visits.]  Education Interventions   Education Provided Provided Printed Education  [education information sent to patient in Mychart on diabetes management.]  Nutrition Interventions   Nutrition Discussed/Reviewed Carbohydrate meal planning, Adding fruits and vegetables, Increasing proteins, Portion sizes, Decreasing sugar intake  Pharmacy Interventions   Pharmacy Dicussed/Reviewed Pharmacy Topics Discussed  [sliding scale discussed. medications reviewed and compliance discussed.]              SDOH assessments and interventions completed:  Yes  SDOH Interventions Today    Flowsheet Row  Most Recent Value  SDOH Interventions   Food Insecurity Interventions Intervention Not Indicated  Housing Interventions Intervention Not Indicated  Transportation Interventions Intervention Not Indicated        Care Coordination Interventions:  No, not indicated   Follow up plan: Follow up call scheduled for 10/01/22    Encounter Outcome:  Pt. Visit Completed   [name] RN,BSN,CCM [facility] Care Coordination [phone] direct line

## 2022-10-01 ENCOUNTER — Ambulatory Visit: Payer: Self-pay

## 2022-10-01 NOTE — Patient Outreach (Signed)
  Care Coordination   10/01/2022 Name: Deborah Alvarado MRN: [id number] DOB: [birth year]   Care Coordination Outreach Attempts:  An unsuccessful telephone outreach was attempted today to offer the patient information about available care coordination services. Unable to reach on home and mobile number. HIPAA compliant message left on mobile number. No answer for mobile number.  Follow Up Plan:  Additional outreach attempts will be made to offer the patient care coordination information and services.   Encounter Outcome:  No Answer   Care Coordination Interventions:  No, not indicated    [name] [facility] Care Coordination [phone] direct line

## 2022-10-07 ENCOUNTER — Telehealth: Payer: Self-pay

## 2022-10-07 NOTE — Patient Outreach (Signed)
  Care Coordination   Follow Up Visit Note   10/07/2022 Name: Deborah Alvarado MRN: [id number] DOB: 31-Jan-1951  Deborah Alvarado is a 72 y.o. year old female who sees [name], MD for primary care. I spoke with  [name] by phone today.  What matters to the patients health and wellness today?  Patient states her blood sugars continue to range from 130's to 300's.  She states she doesn't understand why her blood sugar elevates even when trying to eat healthier.  Patient states she also remains physically active.  Patient reports having cough and runny nose symptoms lasting for approximately 1 month.  Denies sore throat or fever.     Goals Addressed             This Visit's Progress    education and management of diabetes with decrease in Hgb A1c by 2 points       Interventions Today    Flowsheet Row Most Recent Value  Chronic Disease   Chronic disease during today's visit Diabetes, Other  [cough/ runny nose]  General Interventions   General Interventions Discussed/Reviewed General Interventions Reviewed, Doctor Visits  [evaluation of current treatment for diabetes and cough/ runny nose and patients adherence to plan as established by provider. Assessed BS readings. Assessed cough/ runny nose symptoms and duration.]  Doctor Visits Discussed/Reviewed Doctor Visits Reviewed  [name] to notify provider of new symptoms of cough/ runny nose.  reviewed upcoming provider visits.]  Education Interventions   Education Provided Provided Education  Provided Verbal Education On Blood Sugar Monitoring  [patient advised to notify provider of elevated blood sugar readings.]  Nutrition Interventions   Nutrition Discussed/Reviewed Nutrition Reviewed  Pharmacy Interventions   Pharmacy Dicussed/Reviewed Pharmacy Topics Reviewed  [patient advised to contact provider office or pharmacist to inquire if she can change time for lantas dose.  Medications reviewed and compliance discussed.]               SDOH assessments and interventions completed:  No     Care Coordination Interventions:  Yes, provided   Follow up plan: Follow up call scheduled for 10/30/22 at 1:30 pm    Encounter Outcome:  Pt. Visit Completed   [name] RN,BSN,CCM [facility] Care Coordination [phone] direct line

## 2022-10-07 NOTE — Telephone Encounter (Signed)
This encounter was created in error - please disregard.

## 2022-10-07 NOTE — Patient Instructions (Signed)
Visit Information  Thank you for taking time to visit with me today. Please don't hesitate to contact me if I can be of assistance to you.   Following are the goals we discussed today:   Goals Addressed             This Visit's Progress    education and management of diabetes with decrease in Hgb A1c by 2 points       Interventions Today    Flowsheet Row Most Recent Value  Chronic Disease   Chronic disease during today's visit Diabetes, Other  [cough/ runny nose]  General Interventions   General Interventions Discussed/Reviewed General Interventions Reviewed, Doctor Visits  [evaluation of current treatment for diabetes and cough/ runny nose and patients adherence to plan as established by provider. Assessed BS readings. Assessed cough/ runny nose symptoms and duration.]  Doctor Visits Discussed/Reviewed Doctor Visits Reviewed  [name] to notify provider of new symptoms of cough/ runny nose.  reviewed upcoming provider visits.]  Education Interventions   Education Provided Provided Education  Provided Verbal Education On Blood Sugar Monitoring  [patient advised to notify provider of elevated blood sugar readings.]  Nutrition Interventions   Nutrition Discussed/Reviewed Nutrition Reviewed  Pharmacy Interventions   Pharmacy Dicussed/Reviewed Pharmacy Topics Reviewed  [patient advised to contact provider office or pharmacist to inquire if she can change time for lantas dose.  Medications reviewed and compliance discussed.]              Our next appointment is by telephone on 10/30/22 at 1:30pm  Please call the care guide team at [phone] if you need to cancel or reschedule your appointment.   If you are experiencing a Mental Health or Behavioral Health Crisis or need someone to talk to, please call the Suicide and Crisis Lifeline: 988 call 1-800-273-TALK (toll free, 24 hour hotline)  Patient verbalizes understanding of instructions and care plan provided today and agrees  to view in MyChart. Active MyChart status and patient understanding of how to access instructions and care plan via MyChart confirmed with patient.     [name] RN,BSN,CCM [facility] Care Coordination [phone] direct line

## 2022-10-17 ENCOUNTER — Other Ambulatory Visit: Payer: Self-pay | Admitting: Family Medicine

## 2022-10-30 ENCOUNTER — Ambulatory Visit: Payer: Self-pay

## 2022-10-30 NOTE — Patient Instructions (Signed)
Visit Information  Thank you for taking time to visit with me today. Please don't hesitate to contact me if I can be of assistance to you.   Following are the goals we discussed today:   Goals Addressed             This Visit's Progress    education and management of diabetes with decrease in Hgb A1c by 2 points       Interventions Today    Flowsheet Row Most Recent Value  Chronic Disease   Chronic disease during today's visit Diabetes, Other  [cough/ runny nose]  General Interventions   General Interventions Discussed/Reviewed General Interventions Reviewed, Doctor Visits  [evaluation of current treatment plan for diabetes/ runny nose cough and patients adherence to plan as established by provider.  Assessed blood sugar readings and for ongoing cough/ runny nose.]  Doctor Visits Discussed/Reviewed Doctor Visits Reviewed  Annabell Sabal upcoming provider visits. Advised to keep follow up visit with providers. Advised patient scheduled follow up appontment with primary care provider.]  Exercise Interventions   Exercise Discussed/Reviewed Physical Activity  [Discussed with patient how physical activity/ exercise can affect blood sugars postively.  Encouraged to remain active.]  Nutrition Interventions   Nutrition Discussed/Reviewed Carbohydrate meal planning, Nutrition Reviewed, Portion sizes, Decreasing sugar intake, Decreasing salt  [Discussed importance of patient adhering to low carbohydrate diet. Advised to limit portion sizes and decreasing sugar/ salt intake.]  Pharmacy Interventions   Pharmacy Dicussed/Reviewed Pharmacy Topics Reviewed, Referral to Pharmacist  [reviewed medications and discussed compliance.]  Referral to Pharmacist Cannot afford medications  [facility] sent to practice pharmacist regarding medication assitance for Lantas.]              Our next appointment is by telephone on 12/06/22 at 1:30 pm  Please call the care guide team at [phone] if you need to  cancel or reschedule your appointment.   If you are experiencing a Mental Health or Behavioral Health Crisis or need someone to talk to, please call the Suicide and Crisis Lifeline: 988 call 1-800-273-TALK (toll free, 24 hour hotline)  Patient verbalizes understanding of instructions and care plan provided today and agrees to view in MyChart. Active MyChart status and patient understanding of how to access instructions and care plan via MyChart confirmed with patient.     [name] RN,BSN,CCM [facility] Care Coordination [phone] direct line  Diabetes Mellitus and Nutrition, Adult When you have diabetes, or diabetes mellitus, it is very important to have healthy eating habits because your blood sugar (glucose) levels are greatly affected by what you eat and drink. Eating healthy foods in the right amounts, at about the same times every day, can help you: Manage your blood glucose. Lower your risk of heart disease. Improve your blood pressure. Reach or maintain a healthy weight. What can affect my meal plan? Every person with diabetes is different, and each person has different needs for a meal plan. Your health care provider may recommend that you work with a dietitian to make a meal plan that is best for you. Your meal plan may vary depending on factors such as: The calories you need. The medicines you take. Your weight. Your blood glucose, blood pressure, and cholesterol levels. Your activity level. Other health conditions you have, such as heart or kidney disease. How do carbohydrates affect me? Carbohydrates, also called carbs, affect your blood glucose level more than any other type of food. Eating carbs raises the amount of glucose in your blood. It is important  to know how many carbs you can safely have in each meal. This is different for every person. Your dietitian can help you calculate how many carbs you should have at each meal and for each snack. How does alcohol affect  me? Alcohol can cause a decrease in blood glucose (hypoglycemia), especially if you use insulin or take certain diabetes medicines by mouth. Hypoglycemia can be a life-threatening condition. Symptoms of hypoglycemia, such as sleepiness, dizziness, and confusion, are similar to symptoms of having too much alcohol. Do not drink alcohol if: Your health care provider tells you not to drink. You are pregnant, may be pregnant, or are planning to become pregnant. If you drink alcohol: Limit how much you have to: 0-1 drink a day for women. 0-2 drinks a day for men. Know how much alcohol is in your drink. In the [place] one drink equals one 12 oz bottle of beer (355 mL), one 5 oz glass of wine (148 mL), or one 1 oz glass of hard liquor (44 mL). Keep yourself hydrated with water, diet soda, or unsweetened iced tea. Keep in mind that regular soda, juice, and other mixers may contain a lot of sugar and must be counted as carbs. What are tips for following this plan?  Reading food labels Start by checking the serving size on the Nutrition Facts label of packaged foods and drinks. The number of calories and the amount of carbs, fats, and other nutrients listed on the label are based on one serving of the item. Many items contain more than one serving per package. Check the total grams (g) of carbs in one serving. Check the number of grams of saturated fats and trans fats in one serving. Choose foods that have a low amount or none of these fats. Check the number of milligrams (mg) of salt (sodium) in one serving. Most people should limit total sodium intake to less than 2,300 mg per day. Always check the nutrition information of foods labeled as "low-fat" or "nonfat." These foods may be higher in added sugar or refined carbs and should be avoided. Talk to your dietitian to identify your daily goals for nutrients listed on the label. Shopping Avoid buying canned, pre-made, or processed foods. These foods tend to  be high in fat, sodium, and added sugar. Shop around the outside edge of the grocery store. This is where you will most often find fresh fruits and vegetables, bulk grains, fresh meats, and fresh dairy products. Cooking Use low-heat cooking methods, such as baking, instead of high-heat cooking methods, such as deep frying. Cook using healthy oils, such as olive, canola, or sunflower oil. Avoid cooking with butter, cream, or high-fat meats. Meal planning Eat meals and snacks regularly, preferably at the same times every day. Avoid going long periods of time without eating. Eat foods that are high in fiber, such as fresh fruits, vegetables, beans, and whole grains. Eat 4-6 oz (112-168 g) of lean protein each day, such as lean meat, chicken, fish, eggs, or tofu. One ounce (oz) (28 g) of lean protein is equal to: 1 oz (28 g) of meat, chicken, or fish. 1 egg.  cup (62 g) of tofu. Eat some foods each day that contain healthy fats, such as avocado, nuts, seeds, and fish. What foods should I eat? Fruits Berries. Apples. Oranges. Peaches. Apricots. Plums. Grapes. Mangoes. Papayas. Pomegranates. Kiwi. Cherries. Vegetables Leafy greens, including lettuce, spinach, kale, chard, collard greens, mustard greens, and cabbage. Beets. Cauliflower. Broccoli. Carrots. Aaliyha Mumford beans. Tomatoes.  Peppers. Onions. Cucumbers. Brussels sprouts. Grains Whole grains, such as whole-wheat or whole-grain bread, crackers, tortillas, cereal, and pasta. Unsweetened oatmeal. Quinoa. Brown or wild rice. Meats and other proteins Seafood. Poultry without skin. Lean cuts of poultry and beef. Tofu. Nuts. Seeds. Dairy Low-fat or fat-free dairy products such as milk, yogurt, and cheese. The items listed above may not be a complete list of foods and beverages you can eat and drink. Contact a dietitian for more information. What foods should I avoid? Fruits Fruits canned with syrup. Vegetables Canned vegetables. Frozen vegetables  with butter or cream sauce. Grains Refined white flour and flour products such as bread, pasta, snack foods, and cereals. Avoid all processed foods. Meats and other proteins Fatty cuts of meat. Poultry with skin. Breaded or fried meats. Processed meat. Avoid saturated fats. Dairy Full-fat yogurt, cheese, or milk. Beverages Sweetened drinks, such as soda or iced tea. The items listed above may not be a complete list of foods and beverages you should avoid. Contact a dietitian for more information. Questions to ask a health care provider Do I need to meet with a certified diabetes care and education specialist? Do I need to meet with a dietitian? What number can I call if I have questions? When are the best times to check my blood glucose? Where to find more information: American Diabetes Association: diabetes.org Academy of Nutrition and Dietetics: eatright[employer] of Diabetes and Digestive and Kidney Diseases: [email or web address].si Association of Diabetes Care & Education Specialists: diabeteseducator.org Summary It is important to have healthy eating habits because your blood sugar (glucose) levels are greatly affected by what you eat and drink. It is important to use alcohol carefully. A healthy meal plan will help you manage your blood glucose and lower your risk of heart disease. Your health care provider may recommend that you work with a dietitian to make a meal plan that is best for you. This information is not intended to replace advice given to you by your health care provider. Make sure you discuss any questions you have with your health care provider. Document Revised: 09/29/2019 Document Reviewed: 09/29/2019 Elsevier Patient Education  2024 [employer].

## 2022-10-30 NOTE — Patient Outreach (Signed)
  Care Coordination   Follow Up Visit Note   10/30/2022 Name: Deborah Alvarado MRN: [id number] DOB: March 01, 1951  Deborah Alvarado is a 72 y.o. year old female who sees [name], MD for primary care. I spoke with  [name] by phone today.  What matters to the patients health and wellness today?  Patient states she still has occasional runny nose/ cough. 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 Patient states she also has seasonal allergies.  Patient states her blood sugar has been elevated recently due to food choices.  She reports eating occasional fast food and higher fat/ salt meals.   Patient reports blood sugar is ranging from 112 - 300.  She states she is taking her diabetic mediation as prescribed.   Patient reports her lantas has increased to $100 per month.  She states it is becoming more difficult to afford.  Per chart review no follow up visit noted with primary care provider.    Goals Addressed             This Visit's Progress    education and management of diabetes with decrease in Hgb A1c by 2 points       Interventions Today    Flowsheet Row Most Recent Value  Chronic Disease   Chronic disease during today's visit Diabetes, Other  [cough/ runny nose]  General Interventions   General Interventions Discussed/Reviewed General Interventions Reviewed, Doctor Visits  [evaluation of current treatment plan for diabetes/ runny nose cough and patients adherence to plan as established by provider.  Assessed blood sugar readings and for ongoing cough/ runny nose.]  Doctor Visits Discussed/Reviewed Doctor Visits Reviewed  Annabell Sabal upcoming provider visits. Advised to keep follow up visit with providers. Advised patient scheduled follow up appontment with primary care provider.]  Exercise Interventions   Exercise Discussed/Reviewed Physical Activity  [Discussed with patient how physical activity/ exercise can affect blood sugars postively.  Encouraged to remain active.]   Education Interventions   Education Provided Provided Printed Education  [education article sent to patient in Mychart on Diabetes and nutrition]  Nutrition Interventions   Nutrition Discussed/Reviewed Carbohydrate meal planning, Nutrition Reviewed, Portion sizes, Decreasing sugar intake, Decreasing salt  [Discussed importance of patient adhering to low carbohydrate diet. Advised to limit portion sizes and decreasing sugar/ salt intake.]  Pharmacy Interventions   Pharmacy Dicussed/Reviewed Pharmacy Topics Reviewed, Referral to Pharmacist  [reviewed medications and discussed compliance.]  Referral to Pharmacist Cannot afford medications  [facility] sent to practice pharmacist regarding medication assitance for Lantas.]              SDOH assessments and interventions completed:  No     Care Coordination Interventions:  Yes, provided   Follow up plan: Follow up call scheduled for 12/06/22    Encounter Outcome:  Pt. Visit Completed   [name] RN,BSN,CCM [facility] Care Coordination [phone] direct line

## 2022-11-13 DIAGNOSIS — H40003 Preglaucoma, unspecified, bilateral: Secondary | ICD-10-CM | POA: Diagnosis not present

## 2022-11-20 DIAGNOSIS — H2513 Age-related nuclear cataract, bilateral: Secondary | ICD-10-CM | POA: Diagnosis not present

## 2022-11-20 DIAGNOSIS — H40003 Preglaucoma, unspecified, bilateral: Secondary | ICD-10-CM | POA: Diagnosis not present

## 2022-11-20 DIAGNOSIS — E119 Type 2 diabetes mellitus without complications: Secondary | ICD-10-CM | POA: Diagnosis not present

## 2022-11-20 LAB — HM DIABETES EYE EXAM

## 2022-12-02 NOTE — Patient Instructions (Signed)
Visit Information  Thank you for taking time to visit with me today. Please don't hesitate to contact me if I can be of assistance to you.   Following are the goals we discussed today:   Goals Addressed             This Visit's Progress    education and management of diabetes with decrease in Hgb A1c by 2 points       Interventions Today    Flowsheet Row Most Recent Value  Chronic Disease   Chronic disease during today's visit Diabetes  General Interventions   General Interventions Discussed/Reviewed General Interventions Reviewed, Doctor Visits  [evaluation of current treatment plan for diabetes and patients adherence to plan as established by provider.  Assessed for blood sugar readings]  Doctor Visits Discussed/Reviewed Doctor Visits Reviewed  Deborah Alvarado to keep follow up visits with providers]  Exercise Interventions   Exercise Discussed/Reviewed Physical Activity  Education Interventions   Education Provided Provided Education  [Reviewed Rule of 15 treatment for Hypoglycemia.  Education article for diabetes management mailed to patient.  Mailing address confirmed. Teach back method of hypoglycemic method utilized with patient.]  Provided Verbal Education On When to see the doctor  [discussed with patient importance of scheduling follow up visit  with primary care provider.]  Nutrition Interventions   Nutrition Discussed/Reviewed Carbohydrate meal planning, Portion sizes, Decreasing sugar intake  Pharmacy Interventions   Pharmacy Dicussed/Reviewed Pharmacy Topics Reviewed  Deborah Alvarado to take medications as prescribed. Message sent to practice pharmacist regarding need for medication assistance for Lantas.]              Our next appointment is by telephone on 12/26/22 at 11 am  Please call the care guide team at [phone] if you need to cancel or reschedule your appointment.   If you are experiencing a Mental Health or Behavioral Health Crisis or need someone to talk to,  please call the Suicide and Crisis Lifeline: 988 call 1-800-273-TALK (toll free, 24 hour hotline)  The patient verbalized understanding of instructions, educational materials, and care plan provided today and agreed to receive a mailed copy of patient instructions, educational materials, and care plan.   [name] RN,BSN,CCM [facility] Care Coordination [phone] direct line  Diabetes Mellitus Action Plan Following a diabetes action plan is a way for you to manage your diabetes (diabetes mellitus) symptoms. The plan is color-coded to help you understand what actions you need to take based on any symptoms you are having. If you have symptoms in the red zone, you need medical care right away. If you have symptoms in the yellow zone, you are having problems. If you have symptoms in the Deborah Alvarado zone, you are doing well. Learning about and understanding diabetes can take time. Follow the plan that you develop with your health care provider. Know the target range for your blood sugar (glucose) level, and review your treatment plan with your health care provider at each visit. The target range for my blood sugar level is __________________________ mg/dL. Red zone Get medical help right away if you have any of the following symptoms: A blood sugar test result that is below 54 mg/dL (3 mmol/L). A blood sugar test result that is at or above 240 mg/dL (64.3 mmol/L) for 2 days in a row. Confusion or trouble thinking clearly. Difficulty breathing. Sickness or a fever for 2 or more days that is not getting better. Moderate or large ketone levels in your urine. Feeling tired or having no energy.  If you have any red zone symptoms, do not wait to see if the symptoms will go away. Get medical help right away. Call your local emergency services (911 in the [place] Do not drive yourself to the hospital. If you have severely low blood sugar (severe hypoglycemia) and you cannot eat or drink, you may need glucagon.  Make sure a family member or close friend knows how to check your blood sugar and how to give you glucagon. You may need to be treated in a hospital for this condition. Yellow zone If you have any of the following symptoms, your diabetes is not under control and you may need to make some changes: A blood sugar test result that is at or above 240 mg/dL (82.9 mmol/L) for 2 days in a row. Blood sugar test results that are below 70 mg/dL (3.9 mmol/L). Other symptoms of hypoglycemia, such as: Shaking or feeling light-headed. Confusion or irritability. Feeling hungry. Having a fast heartbeat. If you have any yellow zone symptoms: Treat your hypoglycemia by eating or drinking 15 grams of a rapid-acting carbohydrate. Follow the 15:15 rule: Take 15 grams of a rapid-acting carbohydrate, such as: 1 tube of glucose gel. 4 glucose pills. 4 oz (120 mL) of fruit juice. 4 oz (120 mL) of regular (not diet) soda. Check your blood sugar 15 minutes after you take the carbohydrate. If the repeat blood sugar test is still at or below 70 mg/dL (3.9 mmol/L), take 15 grams of a carbohydrate again. If your blood sugar does not increase above 70 mg/dL (3.9 mmol/L) after 3 tries, get medical help right away. After your blood sugar returns to normal, eat a meal or a snack within 1 hour. Keep taking your daily medicines as told by your health care provider. Check your blood sugar more often than you normally would. Write down your results. Call your health care provider if you have trouble keeping your blood sugar in your target range.  Deborah Alvarado zone These signs mean you are doing well and you can continue what you are doing to manage your diabetes: Your blood sugar is within your personal target range. For most people, a blood sugar level before a meal (preprandial) should be 80-130 mg/dL (5.6-2.1 mmol/L). You feel well, and you are able to do daily activities. If you are in the Deborah Alvarado zone, continue to manage your  diabetes as told by your health care provider. To do this: Eat a healthy diet. Exercise regularly. Check your blood sugar as told by your health care provider. Take your medicines as told by your health care provider.  Where to find more information American Diabetes Association (ADA): diabetes.org Association of Diabetes Care & Education Specialists (ADCES): diabeteseducator.org Summary Following a diabetes action plan is a way for you to manage your diabetes symptoms. The plan is color-coded to help you understand what actions you need to take based on any symptoms you are having. Follow the plan that you develop with your health care provider. Make sure you know your personal target blood sugar level. Review your treatment plan with your health care provider at each visit. This information is not intended to replace advice given to you by your health care provider. Make sure you discuss any questions you have with your health care provider. Document Revised: 09/02/2019 Document Reviewed: 09/02/2019 Elsevier Patient Education  2024 Elsevier Inc.  Diabetes Mellitus and Nutrition, Adult When you have diabetes, or diabetes mellitus, it is very important to have healthy eating habits because your  blood sugar (glucose) levels are greatly affected by what you eat and drink. Eating healthy foods in the right amounts, at about the same times every day, can help you: Manage your blood glucose. Lower your risk of heart disease. Improve your blood pressure. Reach or maintain a healthy weight. What can affect my meal plan? Every person with diabetes is different, and each person has different needs for a meal plan. Your health care provider may recommend that you work with a dietitian to make a meal plan that is best for you. Your meal plan may vary depending on factors such as: The calories you need. The medicines you take. Your weight. Your blood glucose, blood pressure, and cholesterol  levels. Your activity level. Other health conditions you have, such as heart or kidney disease. How do carbohydrates affect me? Carbohydrates, also called carbs, affect your blood glucose level more than any other type of food. Eating carbs raises the amount of glucose in your blood. It is important to know how many carbs you can safely have in each meal. This is different for every person. Your dietitian can help you calculate how many carbs you should have at each meal and for each snack. How does alcohol affect me? Alcohol can cause a decrease in blood glucose (hypoglycemia), especially if you use insulin or take certain diabetes medicines by mouth. Hypoglycemia can be a life-threatening condition. Symptoms of hypoglycemia, such as sleepiness, dizziness, and confusion, are similar to symptoms of having too much alcohol. Do not drink alcohol if: Your health care provider tells you not to drink. You are pregnant, may be pregnant, or are planning to become pregnant. If you drink alcohol: Limit how much you have to: 0-1 drink a day for women. 0-2 drinks a day for men. Know how much alcohol is in your drink. In the [place] one drink equals one 12 oz bottle of beer (355 mL), one 5 oz glass of wine (148 mL), or one 1 oz glass of hard liquor (44 mL). Keep yourself hydrated with water, diet soda, or unsweetened iced tea. Keep in mind that regular soda, juice, and other mixers may contain a lot of sugar and must be counted as carbs. What are tips for following this plan?  Reading food labels Start by checking the serving size on the Nutrition Facts label of packaged foods and drinks. The number of calories and the amount of carbs, fats, and other nutrients listed on the label are based on one serving of the item. Many items contain more than one serving per package. Check the total grams (g) of carbs in one serving. Check the number of grams of saturated fats and trans fats in one serving. Choose foods  that have a low amount or none of these fats. Check the number of milligrams (mg) of salt (sodium) in one serving. Most people should limit total sodium intake to less than 2,300 mg per day. Always check the nutrition information of foods labeled as "low-fat" or "nonfat." These foods may be higher in added sugar or refined carbs and should be avoided. Talk to your dietitian to identify your daily goals for nutrients listed on the label. Shopping Avoid buying canned, pre-made, or processed foods. These foods tend to be high in fat, sodium, and added sugar. Shop around the outside edge of the grocery store. This is where you will most often find fresh fruits and vegetables, bulk grains, fresh meats, and fresh dairy products. Cooking Use low-heat cooking methods, such  as baking, instead of high-heat cooking methods, such as deep frying. Cook using healthy oils, such as olive, canola, or sunflower oil. Avoid cooking with butter, cream, or high-fat meats. Meal planning Eat meals and snacks regularly, preferably at the same times every day. Avoid going long periods of time without eating. Eat foods that are high in fiber, such as fresh fruits, vegetables, beans, and whole grains. Eat 4-6 oz (112-168 g) of lean protein each day, such as lean meat, chicken, fish, eggs, or tofu. One ounce (oz) (28 g) of lean protein is equal to: 1 oz (28 g) of meat, chicken, or fish. 1 egg.  cup (62 g) of tofu. Eat some foods each day that contain healthy fats, such as avocado, nuts, seeds, and fish. What foods should I eat? Fruits Berries. Apples. Oranges. Peaches. Apricots. Plums. Grapes. Mangoes. Papayas. Pomegranates. Kiwi. Cherries. Vegetables Leafy greens, including lettuce, spinach, kale, chard, collard greens, mustard greens, and cabbage. Beets. Cauliflower. Broccoli. Carrots. Maalle Starrett beans. Tomatoes. Peppers. Onions. Cucumbers. Brussels sprouts. Grains Whole grains, such as whole-wheat or whole-grain bread,  crackers, tortillas, cereal, and pasta. Unsweetened oatmeal. Quinoa. Brown or wild rice. Meats and other proteins Seafood. Poultry without skin. Lean cuts of poultry and beef. Tofu. Nuts. Seeds. Dairy Low-fat or fat-free dairy products such as milk, yogurt, and cheese. The items listed above may not be a complete list of foods and beverages you can eat and drink. Contact a dietitian for more information. What foods should I avoid? Fruits Fruits canned with syrup. Vegetables Canned vegetables. Frozen vegetables with butter or cream sauce. Grains Refined white flour and flour products such as bread, pasta, snack foods, and cereals. Avoid all processed foods. Meats and other proteins Fatty cuts of meat. Poultry with skin. Breaded or fried meats. Processed meat. Avoid saturated fats. Dairy Full-fat yogurt, cheese, or milk. Beverages Sweetened drinks, such as soda or iced tea. The items listed above may not be a complete list of foods and beverages you should avoid. Contact a dietitian for more information. Questions to ask a health care provider Do I need to meet with a certified diabetes care and education specialist? Do I need to meet with a dietitian? What number can I call if I have questions? When are the best times to check my blood glucose? Where to find more information: American Diabetes Association: diabetes.org Academy of Nutrition and Dietetics: eatright.[employer] of Diabetes and Digestive and Kidney Diseases: StageSync.si Association of Diabetes Care & Education Specialists: diabeteseducator.org Summary It is important to have healthy eating habits because your blood sugar (glucose) levels are greatly affected by what you eat and drink. It is important to use alcohol carefully. A healthy meal plan will help you manage your blood glucose and lower your risk of heart disease. Your health care provider may recommend that you work with a dietitian to make a meal  plan that is best for you. This information is not intended to replace advice given to you by your health care provider. Make sure you discuss any questions you have with your health care provider. Document Revised: 09/29/2019 Document Reviewed: 09/29/2019 Elsevier Patient Education  2024 [employer].

## 2022-12-02 NOTE — Patient Outreach (Signed)
Care Coordination   Follow Up Visit Note   12/02/2022 Name: Deborah Alvarado MRN: [id number] DOB: 01-03-1951  Deborah Alvarado is a 72 y.o. year old female who sees [name], MD for primary care. I spoke with  [name] by phone today.  What matters to the patients health and wellness today?  Patient states her blood sugars continue to range from 115 to 300. She states, " it depends on what I eat that will cause my blood sugar to increase."  Patient reports eating a sub recently which cause her blood sugar to increase.  Patient reports having 3 blood sugar readings in the 50's over the last few weeks. She states the blood sugar drop happened in the middle of the night.  Patient states she didn't know to treat it.  She states she went to the bathroom and then back to bed.      Goals Addressed             This Visit's Progress    education and management of diabetes with decrease in Hgb A1c by 2 points       Interventions Today    Flowsheet Row Most Recent Value  Chronic Disease   Chronic disease during today's visit Diabetes  General Interventions   General Interventions Discussed/Reviewed General Interventions Reviewed, Doctor Visits  [evaluation of current treatment plan for diabetes and patients adherence to plan as established by provider.  Assessed for blood sugar readings]  Doctor Visits Discussed/Reviewed Doctor Visits Reviewed  [name] to keep follow up visits with providers]  Exercise Interventions   Exercise Discussed/Reviewed Physical Activity  Education Interventions   Education Provided Provided Education  [Reviewed Rule of 15 treatment for Hypoglycemia.  Education article for diabetes management mailed to patient.  Mailing address confirmed. Teach back method of hypoglycemic method utilized with patient.]  Provided Verbal Education On When to see the doctor  [discussed with patient importance of scheduling follow up visit  with primary care provider.]  Nutrition  Interventions   Nutrition Discussed/Reviewed Carbohydrate meal planning, Portion sizes, Decreasing sugar intake  Pharmacy Interventions   Pharmacy Dicussed/Reviewed Pharmacy Topics Reviewed  [name] to take medications as prescribed. Message sent to practice pharmacist regarding need for medication assistance for Lantas.]              SDOH assessments and interventions completed:  No     Care Coordination Interventions:  Yes, provided   Follow up plan: Follow up call scheduled for 12/26/22    Encounter Outcome:  Patient Visit Completed   [name] RN,BSN,CCM [facility] Care Coordination [phone] direct line

## 2022-12-05 ENCOUNTER — Other Ambulatory Visit: Payer: Self-pay | Admitting: Pharmacist

## 2022-12-05 ENCOUNTER — Telehealth: Payer: Self-pay | Admitting: Pharmacist

## 2022-12-05 ENCOUNTER — Other Ambulatory Visit: Payer: Medicare Other | Admitting: Pharmacist

## 2022-12-05 ENCOUNTER — Encounter: Payer: Self-pay | Admitting: Pharmacist

## 2022-12-05 DIAGNOSIS — E119 Type 2 diabetes mellitus without complications: Secondary | ICD-10-CM

## 2022-12-05 MED ORDER — BASAGLAR KWIKPEN 100 UNIT/ML ~~LOC~~ SOPN: 5.0000 [IU] | PEN_INJECTOR | Freq: Every day | SUBCUTANEOUS | 3 refills | Status: DC

## 2022-12-05 NOTE — Progress Notes (Unsigned)
12/05/2022 Name: Deborah Alvarado MRN: [id number] DOB: [birth year]  Subjective  No chief complaint on file.   Reason for visit: Deborah Alvarado is a 72 y.o. year old female who presented for a telephone visit.   They were referred to the pharmacist by their PCP for assistance in managing diabetes.   Care Team: Primary Care Provider: [name], MD  Reason for visit: ?  Deborah Alvarado is a 72 y.o. female with a history of diabetes (type 2), who presents today for a diabetes pharmacotherapy visit.? Pertinent PMH also includes HTN, HLD, osteopenia.   Known DM Complications: no known complications    Date of Last Diabetes Related Visit:  08/29/22 with Pharmacist    Action At Last Diabetes Related Visit: ?  CGM Report (6/7 - 6/20); ABG 229 mg/dL; GMI 7.2%; TIR 53%; Time Above: 71%; Time Below 0% Increase Lantus (7 > 10 units daily).  Continue Trulicity 4.5 mg weekly, glimepiride 4 mg daily, metformin 500 mg twice daily  Consider future trial XR metformin to maximize dose   Since Last visit / History of Present Illness: ?  Patient reports implementing*** plan from last visit. Denies adverse effects with titration of ***.   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 Reports that all medications are *** affordable.  Current Patient Assistance: None***   Reported DM Regimen: ?  Metformin IR 500 mg twice daily Trulicity 4.5 mg sq once weekly Lantus 10*** units once daily Glimepiride 4 mg daily with ***breakfsat   DM medications tried in the past:?  *** (***) *** (***) *** (***)  Reported Diet: Patient typically eats *** meals per day.  Breakfast: *** Lunch: *** Dinner: *** Snacks: *** Beverages: ***  SMBG {Blank single:19197::"Per BG meter","Per patient provided BG log","Per patient memory","***"}: ?  ***  Overall, patient thinks that blood sugars are {Blank  single:19197::"***","lower","unchanged","higher"} since last diabetes related visit.    Hypo/Hyperglycemia: ?  Symptoms of hypoglycemia since last visit:? {Blank multiple:19197::"yes","no","n/a","***"}  If yes, it was treated by: {Blank multiple:19197::"***","n/a"}  Symptoms of hyperglycemia since last visit:? {Blank multiple:19197::"yes","no","n/a","***"} - {symptoms; hyperglycemia:17903}  DM Prevention:  Statin: {Blank single:19197::"***","Taking","Not taking","Intolerant to","Declines"}; {Blank single:19197::"low intensity","moderate intensity","high intensity","n/a"}.?  History of chronic kidney disease? {Blank single:19197::"yes","no"} History of albuminuria? {Blank single:19197::"yes","no"}, last UACR on *** = *** mg/g Lab Results  Component Value Date   MICROALBUR 19.9 (H) 02/15/2022   MICROALBUR 1.0 10/05/2008   ACE/ARB - {Blank single:19197::"***","Taking","Not taking","Intolerant to","Declines"} ***; Urine MA/CR Ratio - {Blank single:19197::"normal","elevated urinary albumin excretion","n/a","***"}.  Last eye exam: *** Last foot exam: 05/23/2022 Tobacco Use:  Tobacco Use: Low Risk  (08/29/2022)   Patient History    Smoking Tobacco Use: Never    Smokeless Tobacco Use: Never    Passive Exposure: Not on file   Immunizations:? Flu: {LZDUEUPTODATE:31033}, Pneumococcal: {LZVAXpneumococcal:31032:::0}, Shingrix: {LZDUEUPTODATE:31033}, Covid (***)  Cardiovascular Risk Reduction History of clinical ASCVD? {Blank single:19197::"yes","no"} The 10-year ASCVD risk score (Arnett DK, et al., 2019) is: 24.7% History of heart failure? {Blank single:19197::"yes","no"} {HF MEDS CURRENT:210917263} History of hyperlipidemia? {Blank single:19197::"yes","no"} Current BMI: *** kg/m2 (Ht *** in, Wt *** kg) Taking statin? {Blank single:19197::"yes","no","intolerant","declines"}; {Blank single:19197::"low intensity","moderate intensity","high intensity","n/a"} (***) Taking aspirin? {Blank  single:19197::"indicated (primary prevention)","indicated (secondary prevention)","not indicated","unclear if indicated"}; {Blank single:19197::"***","Taking","Not taking","Intolerant to","Declines"}   Taking SGLT-2i? {Blank single:19197::"yes","no"} Taking GLP- 1 RA? {Blank single:19197::"yes","no"}      _______________________________________________  Objective    Review of Systems:?  Constitutional:? No fever,  chills or unintentional weight loss  Cardiovascular:? No chest pain or pressure, shortness of breath, dyspnea on exertion, orthopnea or LE edema  Pulmonary:? No cough or shortness of breath  GI:? No nausea, vomiting, constipation, diarrhea, abdominal pain, dyspepsia, change in bowel habits  Endocrine:? No polyuria, polyphagia or blurred vision  Psych:? No depression, anxiety, insomnia    Physical Examination:  Vitals:  Wt Readings from Last 3 Encounters:  05/23/22 160 lb (72.6 kg)  04/30/22 156 lb (70.8 kg)  02/15/22 156 lb (70.8 kg)   BP Readings from Last 3 Encounters:  05/23/22 138/84  04/30/22 (!) 134/92  04/25/22 120/80   Pulse Readings from Last 3 Encounters:  05/23/22 91  04/30/22 80  04/10/22 (!) 104     Labs:?  Lab Results  Component Value Date   HGBA1C 10.2 (A) 05/23/2022   HGBA1C 9.3 (H) 02/15/2022   HGBA1C 8.8 (H) 12/13/2021   GLUCOSE 191 (H) 12/13/2021   MICRALBCREAT 16.2 02/15/2022   MICRALBCREAT 8.3 10/05/2008   MICRALBCREAT 15.0 07/16/2007   CREATININE 0.88 12/13/2021   CREATININE 0.81 03/20/2021   CREATININE 0.84 12/29/2017   GFRNONAA 68.49 01/02/2010   GFRNONAA 78.66 04/12/2009   GFRNONAA 91.90 11/15/2008    Lab Results  Component Value Date   CHOL 141 12/13/2021   LDLDIRECT 84.0 03/20/2021   HDL 45.00 12/13/2021   HDL 39.00 (L) 03/20/2021   HDL 34.70 (L) 12/29/2017   AST 21 12/13/2021   AST 28 03/20/2021   ALT 25 12/13/2021   ALT 32 03/20/2021      Chemistry      Component Value Date/Time   NA 138 12/13/2021 1105   K 4.4  12/13/2021 1105   CL 104 12/13/2021 1105   CO2 25 12/13/2021 1105   BUN 12 12/13/2021 1105   CREATININE 0.88 12/13/2021 1105      Component Value Date/Time   CALCIUM 9.7 12/13/2021 1105   ALKPHOS 88 12/13/2021 1105   AST 21 12/13/2021 1105   ALT 25 12/13/2021 1105   BILITOT 0.6 12/13/2021 1105       The 10-year ASCVD risk score (Arnett DK, et al., 2019) is: 24.7%  Assessment and Plan:   1. Diabetes, type 2: *** per last A1c of ***% (***) with goal <7% without hypoglycemia. CGM data shows ***?. Current Regimen: Lantus 10*** units daily, Trulicity 4.5 mg weekly, metformin IR 500 mg BID, glimepiride 5 mg once daily Diet: *** Exercise: ***  Continue medications today without changes.  Reviewed signs/symptoms/treatment of hypoglycemia  Next A1c due *** Future Consideration: GLP1-RA: *** SGLT2i: *** Metformin: Trial ER metformin to achieve goal dose of 1000 mg BID  SU: With optmization of other therapies, ideally discontinue glimepiride  TZD: Avoiding due to possible weight gain. Meal time Insulin: Not unreasonable, though defer as last resort or as needed for severe hyperglycemia given risk hypoglycemia. Focus on dietary modifications and exercise to control post-prandial sugars.     2. HTN Uncontrolled based on last clinic BP of 138/84 mmHg, though this was back in March. Goal BP <130/80 mmHg. Does not *** monitor BP at home.  Current Regimen: losartan 50 mg daily  Continue medications without changes.    3. ASCVD (primary prevention): The 10-year ASCVD risk score (Arnett DK, et al., 2019) is: 24.7% and lipid panel on 12/13/21 with LDL of 59 mg/dL indicates patient was at goal LDL though repeat lipid panel is overdue.  Key risk factors include: {{cvs risk:31001:s}} Current Regimen: simvastatin 40 mg daily Continue  medications today without changes.  Future Consideration: ***  4. Healthcare Maintenance:  Pneumococcal - Current status: ***  Shingles - Current status:  *** Influenza - Current status: ***  Due to receive the following vaccines: {LZAPIMMUNIZATION:31028}   Follow Up Follow up with clinical pharmacist via *** in *** weeks.  ?  Follow up with Deborah Nam, MD ***?  Future Appointments  Date Time Provider Department Center  12/05/2022 11:30 AM [facility] PHARMACIST [facility]  12/26/2022 11:00 AM Otho Ket, RN [facility] None  01/31/2023  8:15 AM [facility] ANNUAL WELLNESS VISIT 1 [facility]     Loree Fee, PharmD Clinical Pharmacist [facility] [phone]

## 2022-12-05 NOTE — Telephone Encounter (Signed)
Attempted to contact patient to discuss medication cost concerns regarding her insulin. Left HIPAA compliant message for patient to return my call at their convenience.   Loree Fee, PharmD Clinical Pharmacist [facility] [phone]

## 2022-12-05 NOTE — Progress Notes (Signed)
12/05/2022 Name: Deborah Alvarado MRN: [id number] DOB: 02/03/1951  Subjective    Reason for visit: Deborah Alvarado is a 72 y.o. year old female who presented for a telephone visit to discuss medication access concerns.   They were referred to the pharmacist by their PCP for assistance in managing diabetes and medication access.   Care Team: Primary Care Provider: [name], MD   Reason for visit: ?  Deborah Alvarado is a 72 y.o. female with a history of diabetes (type 2), who presents today for a phone visit with the pharmacist due to medication access concerns. ?   Medication Access: ?  Prescription drug coverage: Payor: Advertising copywriter MEDICARE / Plan: [facility] MEDICARE / Product Type: *No Product type* / .   Reports that all medications are not affordable.  Patient reports that her Lantus copay has increased since entering the Coverage Gap (Donut hole). She states that it was  $33/month and increased to >$100/month. She has about 2 weeks of insulin remaining at home.   Is also having trouble with CGM (one sensor fell off early, one sensor is not reading her blood sugar).   Medicare LIS Eligible: No   Current Patient Assistance: [employer] (Trulicity)    Assessment and Plan:    1. Medication Access Cost of insulin has increased which patient reports is due to entering the Medicare Coverage Gap. However... discussed with patient that this is likely a billing error (such as pharmacy billing >30 days or other issue). Medicare's price cap on insulin still applies once patients enter the coverage gap. We also discussed with her current enrollment in [place], she would be able to use Basaglar (glargine) instead of Lantus (glargine). She would like an Rx sent to [facility]. Will reach out to PCP to discuss.  Basaglar (glargine) Kwikpen 100 U/mL electronically sent to [facility] (Replaces Lantus) Patient confirms understanding that this is the same type of insulin and  will replace her Lantus. We reviewed that the dosing is 1:1 (eg she will inject the same number of units as currently using via Lantus).  If shipment has not arrived by the time she is close to running out of insulin, she will call her pharmacy once more regarding $35 copay for Lantus. MyChart message sent regarding ordering replacement Dexcom sensors.   Future Appointments  Date Time Provider Department Center  12/26/2022 11:00 AM Otho Ket, RN [facility] None  01/31/2023  8:15 AM [facility] ANNUAL WELLNESS VISIT 1 [facility]    Loree Fee, PharmD Clinical Pharmacist [facility] [phone]

## 2022-12-16 ENCOUNTER — Encounter: Payer: Self-pay | Admitting: Pharmacist

## 2022-12-17 DIAGNOSIS — M25511 Pain in right shoulder: Secondary | ICD-10-CM | POA: Diagnosis not present

## 2022-12-19 DIAGNOSIS — M25511 Pain in right shoulder: Secondary | ICD-10-CM | POA: Diagnosis not present

## 2022-12-20 ENCOUNTER — Other Ambulatory Visit: Payer: Self-pay | Admitting: Family Medicine

## 2022-12-20 DIAGNOSIS — M75121 Complete rotator cuff tear or rupture of right shoulder, not specified as traumatic: Secondary | ICD-10-CM | POA: Diagnosis not present

## 2022-12-20 DIAGNOSIS — E1165 Type 2 diabetes mellitus with hyperglycemia: Secondary | ICD-10-CM

## 2022-12-20 DIAGNOSIS — M7521 Bicipital tendinitis, right shoulder: Secondary | ICD-10-CM | POA: Diagnosis not present

## 2022-12-26 ENCOUNTER — Ambulatory Visit: Payer: Self-pay

## 2022-12-26 NOTE — Patient Outreach (Signed)
  Care Coordination   12/26/2022 Name: Deborah Alvarado MRN: [id number] DOB: [birth year]   Care Coordination Outreach Attempts:  Successful contact made with patient.  [place] and request call back next week.   Follow Up Plan:  Additional outreach attempts will be made to offer the patient care coordination information and services.   Encounter Outcome:  Patient Request to Call Back   Care Coordination Interventions:  No, not indicated    [name] [facility] Care Coordination [phone] direct line

## 2022-12-26 NOTE — Telephone Encounter (Signed)
Please call patient needs DM f/u

## 2022-12-26 NOTE — Telephone Encounter (Signed)
Lvmtcb, sent mychart message

## 2022-12-31 ENCOUNTER — Encounter: Payer: Self-pay | Admitting: Pharmacist

## 2023-01-03 ENCOUNTER — Ambulatory Visit: Payer: Self-pay

## 2023-01-03 NOTE — Patient Instructions (Signed)
Visit Information  Thank you for taking time to visit with me today. Please don't hesitate to contact me if I can be of assistance to you.   Following are the goals we discussed today:   Goals Addressed             This Visit's Progress    education and management of diabetes with decrease in Hgb A1c by 2 points       Interventions Today    Flowsheet Row Most Recent Value  Chronic Disease   Chronic disease during today's visit Diabetes, Other  [right shoulder injury due to fall]  General Interventions   General Interventions Discussed/Reviewed General Interventions Reviewed, Doctor Visits  [evaluation of current treatment plan for diabetes/ right shoulder injury / fall and patients adherence to plan as established by provider.  Assessed for BS readings and pain level]  Doctor Visits Discussed/Reviewed Doctor Visits Reviewed  [name] to keep follow up appointments with provider.]  Education Interventions   Provided Verbal Education On Blood Sugar Monitoring, When to see the doctor  [Advised to use ice / heat to right shoulder for pain management.  Advised to notify provider for worsening shoulder pain.]  Nutrition Interventions   Nutrition Discussed/Reviewed Nutrition Reviewed, Carbohydrate meal planning  Pharmacy Interventions   Pharmacy Dicussed/Reviewed Pharmacy Topics Reviewed  [name] to take medications as prescribed.]              Our next appointment is by telephone on 02/14/23 at 2:30 pm  Please call the care guide team at [phone] if you need to cancel or reschedule your appointment.   If you are experiencing a Mental Health or Behavioral Health Crisis or need someone to talk to, please call the Suicide and Crisis Lifeline: 988 call 1-800-273-TALK (toll free, 24 hour hotline)  Patient verbalizes understanding of instructions and care plan provided today and agrees to view in MyChart. Active MyChart status and patient understanding of how to access instructions  and care plan via MyChart confirmed with patient.     [name] RN,BSN,CCM [facility] Care Coordination [phone] direct line

## 2023-01-03 NOTE — Patient Outreach (Signed)
  Care Coordination   Follow Up Visit Note   01/03/2023 Name: Deborah Alvarado MRN: [id number] DOB: December 27, 1950  Deborah Alvarado is a 72 y.o. year old female who sees [name], MD for primary care. I spoke with  [name] by phone today.  What matters to the patients health and wellness today?  Patient reports having recent fall causing injury to right shoulder.  She states recent MRI results notes inflammation and bursitis.  Patient states she was unable to tolerate the pain medication that was prescribed so now she is taking a prescribed anti inflammatory.  Reports today's pain level is 6.   Patient states her next orthopedic follow up visit is 01/2023.  Patient reports today's blood sugar was 200.  She states blood sugars are ranging from 90-300 and contributes these higher blood sugars to dietary food choices.      Goals Addressed             This Visit's Progress    education and management of diabetes with decrease in Hgb A1c by 2 points       Interventions Today    Flowsheet Row Most Recent Value  Chronic Disease   Chronic disease during today's visit Diabetes, Other  [right shoulder injury due to fall]  General Interventions   General Interventions Discussed/Reviewed General Interventions Reviewed, Doctor Visits  [evaluation of current treatment plan for diabetes/ right shoulder injury / fall and patients adherence to plan as established by provider.  Assessed for BS readings and pain level]  Doctor Visits Discussed/Reviewed Doctor Visits Reviewed  [name] to keep follow up appointments with provider.]  Education Interventions   Provided Verbal Education On Blood Sugar Monitoring, When to see the doctor  [Advised to use ice / heat to right shoulder for pain management.  Advised to notify provider for worsening shoulder pain.]  Nutrition Interventions   Nutrition Discussed/Reviewed Nutrition Reviewed, Carbohydrate meal planning  Pharmacy Interventions   Pharmacy  Dicussed/Reviewed Pharmacy Topics Reviewed  [name] to take medications as prescribed.]              SDOH assessments and interventions completed:  No     Care Coordination Interventions:  Yes, provided   Follow up plan: Follow up call scheduled for 02/14/23    Encounter Outcome:  Patient Visit Completed   [name] RN,BSN,CCM [facility] Care Coordination [phone] direct line

## 2023-01-13 DIAGNOSIS — M75121 Complete rotator cuff tear or rupture of right shoulder, not specified as traumatic: Secondary | ICD-10-CM | POA: Diagnosis not present

## 2023-01-16 DIAGNOSIS — L570 Actinic keratosis: Secondary | ICD-10-CM | POA: Diagnosis not present

## 2023-01-16 DIAGNOSIS — D2262 Melanocytic nevi of left upper limb, including shoulder: Secondary | ICD-10-CM | POA: Diagnosis not present

## 2023-01-16 DIAGNOSIS — L918 Other hypertrophic disorders of the skin: Secondary | ICD-10-CM | POA: Diagnosis not present

## 2023-01-16 DIAGNOSIS — D225 Melanocytic nevi of trunk: Secondary | ICD-10-CM | POA: Diagnosis not present

## 2023-01-16 DIAGNOSIS — X32XXXA Exposure to sunlight, initial encounter: Secondary | ICD-10-CM | POA: Diagnosis not present

## 2023-01-16 DIAGNOSIS — L821 Other seborrheic keratosis: Secondary | ICD-10-CM | POA: Diagnosis not present

## 2023-01-16 DIAGNOSIS — D2261 Melanocytic nevi of right upper limb, including shoulder: Secondary | ICD-10-CM | POA: Diagnosis not present

## 2023-01-23 DIAGNOSIS — M75121 Complete rotator cuff tear or rupture of right shoulder, not specified as traumatic: Secondary | ICD-10-CM | POA: Diagnosis not present

## 2023-01-27 ENCOUNTER — Telehealth: Payer: Self-pay | Admitting: Family Medicine

## 2023-01-27 NOTE — Telephone Encounter (Signed)
Prescription Request  01/27/2023  LOV: 05/23/2022  What is the name of the medication or equipment? Continuous Glucose Sensor (FREESTYLE LIBRE 3 SENSOR) MISC , patient needs replacements and is not able to get libre 3   Have you contacted your pharmacy to request a refill? Yes   Which pharmacy would you like this sent to?  [id number] - [place], [place] - [address] [place] [address] Phone: [phone] Fax: [phone]    Patient notified that their request is being sent to the clinical staff for review and that they should receive a response within 2 business days.   Please advise at Mobile [phone] (mobile)  Patient is having trouble refilling this, likely due to discontinuation of libre 3 and change to 3 plus, please advise

## 2023-01-28 DIAGNOSIS — M75121 Complete rotator cuff tear or rupture of right shoulder, not specified as traumatic: Secondary | ICD-10-CM | POA: Diagnosis not present

## 2023-01-28 MED ORDER — FREESTYLE LIBRE 3 PLUS SENSOR MISC: 3 refills | Status: AC

## 2023-01-28 NOTE — Telephone Encounter (Signed)
Rx sent for libre 3 plus as a replacement. Let me know if she can't get that filled.  Thanks.

## 2023-01-28 NOTE — Telephone Encounter (Signed)
Noted. Thanks.

## 2023-01-28 NOTE — Telephone Encounter (Signed)
Called patient she received call from pharmacy that they are on back order. Advised patient to call other pharmacy and see if they have in stock, if so prescription can be transferred by pharmacy. She will reach out if any questions.

## 2023-01-30 ENCOUNTER — Other Ambulatory Visit: Payer: Self-pay | Admitting: Family Medicine

## 2023-01-30 ENCOUNTER — Ambulatory Visit: Payer: Medicare Other

## 2023-01-30 VITALS — Ht 64.0 in | Wt 153.0 lb

## 2023-01-30 DIAGNOSIS — Z0000 Encounter for general adult medical examination without abnormal findings: Secondary | ICD-10-CM

## 2023-01-30 DIAGNOSIS — M8580 Other specified disorders of bone density and structure, unspecified site: Secondary | ICD-10-CM

## 2023-01-30 DIAGNOSIS — E119 Type 2 diabetes mellitus without complications: Secondary | ICD-10-CM | POA: Diagnosis not present

## 2023-01-30 DIAGNOSIS — Z1231 Encounter for screening mammogram for malignant neoplasm of breast: Secondary | ICD-10-CM

## 2023-01-30 NOTE — Progress Notes (Signed)
Subjective:   Deborah Alvarado is a 72 y.o. female who presents for Medicare Annual (Subsequent) preventive examination.  Visit Complete: Virtual I connected with  [name] on 01/30/23 by a audio enabled telemedicine application and verified that I am speaking with the correct person using two identifiers.  Patient Location: Home  Provider Location: Office/Clinic  I discussed the limitations of evaluation and management by telemedicine. The patient expressed understanding and agreed to proceed.  Vital Signs: Because this visit was a virtual/telehealth visit, some criteria may be missing or patient reported. Any vitals not documented were not able to be obtained and vitals that have been documented are patient reported.  Patient Medicare AWV questionnaire was completed by the patient on (not done); I have confirmed that all information answered by patient is correct and no changes since this date.  Cardiac Risk Factors include: advanced age (>74men, >16 women);diabetes mellitus;dyslipidemia;female gender;hypertension    Objective:    Today's Vitals   01/30/23 0857 01/30/23 0858  Weight: 153 lb (69.4 kg)   Height: 5\' 4"  (1.626 m)   PainSc:  5    Body mass index is 26.26 kg/m.     01/30/2023    9:17 AM 01/29/2022    8:17 AM 01/23/2021    1:26 PM  Advanced Directives  Does Patient Have a Medical Advance Directive? Yes No Yes  Type of Estate agent of [place];Living will  Healthcare Power of [place];Living will  Does patient want to make changes to medical advance directive?   Yes (MAU/Ambulatory/Procedural Areas - Information given)  Copy of Healthcare Power of Attorney in Chart? No - copy requested    Would patient like information on creating a medical advance directive?  No - Patient declined     Current Medications (verified) Outpatient Encounter Medications as of 01/30/2023  Medication Sig   Cholecalciferol (VITAMIN D) 50 MCG (2000 UT) tablet  Take 1 tablet (2,000 Units total) by mouth daily.   Continuous Glucose Sensor (FREESTYLE LIBRE 3 PLUS SENSOR) MISC Change sensor every 15 days.   Dulaglutide (TRULICITY) 4.5 MG/0.5ML SOPN Inject 4.5 mg as directed once a week.   glimepiride (AMARYL) 4 MG tablet Take 4 mg by mouth daily with breakfast.   Insulin Glargine (BASAGLAR KWIKPEN) 100 UNIT/ML Inject 5-20 Units into the skin daily.   Insulin Pen Needle (PEN NEEDLES) 31G X 5 MM MISC Use daily with insulin pen   losartan (COZAAR) 50 MG tablet TAKE 1 TABLET BY MOUTH DAILY   metFORMIN (GLUCOPHAGE) 500 MG tablet Take 1 tablet (500 mg total) by mouth 2 (two) times daily with a meal.   simvastatin (ZOCOR) 40 MG tablet TAKE 1 TABLET BY MOUTH AT  BEDTIME   No facility-administered encounter medications on file as of 01/30/2023.    Allergies (verified) Lisinopril and Metformin and related   History: Past Medical History:  Diagnosis Date   Allergy    Anxiety    Diabetes mellitus    Type II   Hyperlipidemia    Hypertension    NSVD (normal spontaneous vaginal delivery)    x 2   Past Surgical History:  Procedure Laterality Date   TOTAL ABDOMINAL HYSTERECTOMY  01/2005   due to fibroids   Family History  Problem Relation Age of Onset   Cancer Mother        Leukemia   Diabetes Brother    Breast cancer Maternal Grandmother    Colon cancer Neg Hx    Social History  Socioeconomic History   Marital status: Married    Spouse name: Not on file   Number of children: 2   Years of education: Not on file   Highest education level: Not on file  Occupational History    Employer: [employer]  Tobacco Use   Smoking status: Never   Smokeless tobacco: Never  Substance and Sexual Activity   Alcohol use: No    Alcohol/week: 0.0 standard drinks of alcohol   Drug use: No   Sexual activity: Not on file  Other Topics Concern   Not on file  Social History Narrative   Grandchild died 04-29-2022   Worked at [employer], retired 2019    Social Determinants of Health   Financial Resource Strain: Low Risk  (01/30/2023)   Overall Financial Resource Strain (CARDIA)    Difficulty of Paying Living Expenses: Not hard at all  Food Insecurity: No Food Insecurity (01/30/2023)   Hunger Vital Sign    Worried About Running Out of Food in the Last Year: Never true    Ran Out of Food in the Last Year: Never true  Transportation Needs: No Transportation Needs (01/30/2023)   PRAPARE - Administrator, Civil Service (Medical): No    Lack of Transportation (Non-Medical): No  Physical Activity: Insufficiently Active (01/30/2023)   Exercise Vital Sign    Days of Exercise per Week: 3 days    Minutes of Exercise per Session: 30 min  Stress: No Stress Concern Present (01/30/2023)   [employer] of Occupational Health - Occupational Stress Questionnaire    Feeling of Stress : Not at all  Social Connections: Moderately Isolated (01/30/2023)   Social Connection and Isolation Panel [NHANES]    Frequency of Communication with Friends and Family: More than three times a week    Frequency of Social Gatherings with Friends and Family: More than three times a week    Attends Religious Services: Never    Database administrator or Organizations: No    Attends Engineer, structural: Never    Marital Status: Married    Tobacco Counseling Counseling given: Not Answered   Clinical Intake:  Pre-visit preparation completed: Yes  Pain : 0-10 Pain Score: 5  Pain Type: Chronic pain Pain Location: Shoulder Pain Orientation: Right Pain Descriptors / Indicators: Aching Pain Onset: More than a month ago Pain Frequency: Intermittent Pain Relieving Factors: exercises,sling,ice pack  Pain Relieving Factors: exercises,sling,ice pack  BMI - recorded: 26.26 Nutritional Status: BMI 25 -29 Overweight Nutritional Risks: None Diabetes: Yes CBG done?: No Did pt. bring in CBG monitor from home?: No  How often do you need to  have someone help you when you read instructions, pamphlets, or other written materials from your doctor or pharmacy?: 1 - Never  Interpreter Needed?: No  Comments: lives with husband Information entered by :: B.Mayleigh Tetrault,LPN   Activities of Daily Living    01/30/2023    9:17 AM  In your present state of health, do you have any difficulty performing the following activities:  Hearing? 0  Vision? 0  Difficulty concentrating or making decisions? 0  Walking or climbing stairs? 0  Dressing or bathing? 0  Doing errands, shopping? 0  Preparing Food and eating ? N  Using the Toilet? N  In the past six months, have you accidently leaked urine? N  Do you have problems with loss of bowel control? N  Managing your Medications? N  Managing your Finances? N  Housekeeping or managing your  Housekeeping? N    Patient Care Team: [name], MD as PCP - General Gershon Crane Bernadene Bell, MD as Consulting Physician (Internal Medicine) [place] Crane, MD as Consulting Physician (Ophthalmology) Otho Ket, RN as [facility] Management Zink, Huel Coventry, [facility] (Pharmacist)  Indicate any recent Medical Services you may have received from other than [facility] providers in the past year (date may be approximate).     Assessment:   This is a routine wellness examination for Deborah Alvarado.  Hearing/Vision screen Hearing Screening - Comments:: Pt says her hearing is good Vision Screening - Comments:: Pt says her vision is good with glasses'has glaucoma Dr Brooke Dare   Goals Addressed             This Visit's Progress    DIET - EAT MORE FRUITS AND VEGETABLES   Not on track    education and management of diabetes with decrease in Hgb A1c by 2 points   Not on track    Interventions Today    Flowsheet Row Most Recent Value  Chronic Disease   Chronic disease during today's visit Diabetes, Other  [right shoulder injury due to fall]  General Interventions   General Interventions  Discussed/Reviewed General Interventions Reviewed, Doctor Visits  [evaluation of current treatment plan for diabetes/ right shoulder injury / fall and patients adherence to plan as established by provider.  Assessed for BS readings and pain level]  Doctor Visits Discussed/Reviewed Doctor Visits Reviewed  [name] to keep follow up appointments with provider.]  Education Interventions   Provided Verbal Education On Blood Sugar Monitoring, When to see the doctor  [Advised to use ice / heat to right shoulder for pain management.  Advised to notify provider for worsening shoulder pain.]  Nutrition Interventions   Nutrition Discussed/Reviewed Nutrition Reviewed, Carbohydrate meal planning  Pharmacy Interventions   Pharmacy Dicussed/Reviewed Pharmacy Topics Reviewed  [name] to take medications as prescribed.]           Monitor and Manage My Blood Sugar-Diabetes Type 2   On track    Timeframe:  Long-Range Goal Priority:  High Start Date:         07/03/21                    Expected End Date:  07/04/22                     Follow Up Date July 2023   - check blood sugar at prescribed times - check blood sugar before and after exercise - check blood sugar if I feel it is too high or too low - take the blood sugar meter to all doctor visits    Why is this important?   Checking your blood sugar at home helps to keep it from getting very high or very low.  Writing the results in a diary or log helps the doctor know how to care for you.  Your blood sugar log should have the time, date and the results.  Also, write down the amount of insulin or other medicine that you take.  Other information, like what you ate, exercise done and how you were feeling, will also be helpful.     Notes:      Patient Stated   Not on track    Would like to maintain drinking more water       Depression Screen    01/30/2023    9:13 AM 05/23/2022    9:44  AM 01/29/2022    8:14 AM 01/23/2021    1:28 PM 02/28/2020     2:31 PM 12/29/2017   11:05 AM 02/07/2017   12:01 PM  PHQ 2/9 Scores  PHQ - 2 Score 0 0 0 0 0 0 1  PHQ- 9 Score  2 0        Fall Risk    01/30/2023    9:04 AM 05/23/2022    9:44 AM 04/30/2022   11:31 AM 01/29/2022    8:17 AM 01/23/2021    1:28 PM  Fall Risk   Falls in the past year? 1 0 0 0 0  Number falls in past yr: 0 0 0 0 0  Injury with Fall? 0 0 0 0 0  Risk for fall due to : No Fall Risks No Fall Risks No Fall Risks No Fall Risks No Fall Risks  Follow up Education provided;Falls prevention discussed Falls evaluation completed Falls evaluation completed Falls prevention discussed;Falls evaluation completed Falls prevention discussed    MEDICARE RISK AT HOME: Medicare Risk at Home Any stairs in or around the home?: Yes If so, are there any without handrails?: Yes Home free of loose throw rugs in walkways, pet beds, electrical cords, etc?: Yes Adequate lighting in your home to reduce risk of falls?: Yes Life alert?: No Use of a cane, walker or w/c?: No Grab bars in the bathroom?: No Shower chair or bench in shower?: Yes Elevated toilet seat or a handicapped toilet?: Yes  TIMED UP AND GO:  Was the test performed?  No    Cognitive Function:        01/30/2023    9:18 AM 01/29/2022    8:18 AM  6CIT Screen  What Year? 0 points 0 points  What month? 0 points 0 points  What time? 0 points 0 points  Count back from 20 0 points 0 points  Months in reverse 0 points 0 points  Repeat phrase 0 points 0 points  Total Score 0 points 0 points    Immunizations Immunization History  Administered Date(s) Administered   Fluad Quad(high Dose 65+) 01/22/2023   Hep A / Hep B 07/31/2012, 08/31/2012, 01/21/2013   Influenza Whole 12/23/2008, 12/15/2009   Influenza, High Dose Seasonal PF 01/03/2019   Influenza,inj,Quad PF,6+ Mos 11/25/2012, 01/08/2015, 12/29/2017   Influenza-Unspecified 12/06/2013, 12/25/2015, 12/23/2016, 01/10/2020, 11/23/2020   PFIZER Comirnaty(Gray  Top)Covid-19 Tri-Sucrose Vaccine 01/17/2020   PFIZER(Purple Top)SARS-COV-2 Vaccination 04/12/2019, 05/09/2019, 01/17/2020   Pfizer Covid-19 Vaccine Bivalent Booster 54yrs & up 01/21/2021   Pneumococcal Conjugate-13 12/29/2017, 01/05/2019   Pneumococcal Polysaccharide-23 12/25/2010   Td 04/11/2005   Tdap 02/06/2017   Zoster Recombinant(Shingrix) 01/28/2019, 04/05/2019   Zoster, Live 07/31/2012    TDAP status: Up to date  Flu Vaccine status: Up to date  Pneumococcal vaccine status: Up to date  Covid-19 vaccine status: Completed vaccines  Qualifies for Shingles Vaccine? Yes   Zostavax completed Yes   Shingrix Completed?: Yes  Screening Tests Health Maintenance  Topic Date Due   Pneumonia Vaccine 100+ Years old (3 of 3 - PPSV23 or PCV20) 01/05/2020   HEMOGLOBIN A1C  11/23/2022   Diabetic kidney evaluation - eGFR measurement  12/14/2022   Diabetic kidney evaluation - Urine ACR  02/16/2023   COVID-19 Vaccine (6 - 2023-24 season) 02/15/2023 (Originally 11/10/2022)   FOOT EXAM  05/23/2023   Colonoscopy  11/06/2023   OPHTHALMOLOGY EXAM  11/20/2023   Medicare Annual Wellness (AWV)  01/30/2024   MAMMOGRAM  03/08/2024   DTaP/Tdap/Td (  3 - Td or Tdap) 02/07/2027   INFLUENZA VACCINE  Completed   DEXA SCAN  Completed   Hepatitis C Screening  Completed   Zoster Vaccines- Shingrix  Completed   HPV VACCINES  Aged Out    Health Maintenance  Health Maintenance Due  Topic Date Due   Pneumonia Vaccine 7+ Years old (3 of 3 - PPSV23 or PCV20) 01/05/2020   HEMOGLOBIN A1C  11/23/2022   Diabetic kidney evaluation - eGFR measurement  12/14/2022   Diabetic kidney evaluation - Urine ACR  02/16/2023    Colorectal cancer screening: Type of screening: Colonoscopy. Completed 11/06/2018. Repeat every 5 years  Mammogram status: Completed 03/08/22. Repeat every year  Bone Density status: Completed 03/07/2021. Results reflect: Bone density results: OSTEOPENIA. Repeat every 3-5 years.  Lung Cancer  Screening: (Low Dose CT Chest recommended if Age 6-80 years, 20 pack-year currently smoking OR have quit w/in 15years.) does not qualify.   Lung Cancer Screening Referral: no  Additional Screening:  Hepatitis C Screening: does not qualify; Completed 12/29/2017  Vision Screening: Recommended annual ophthalmology exams for early detection of glaucoma and other disorders of the eye. Is the patient up to date with their annual eye exam?  Yes  Who is the provider or what is the name of the office in which the patient attends annual eye exams? Dr Brooke Dare If pt is not established with a provider, would they like to be referred to a provider to establish care? No .   Dental Screening: Recommended annual dental exams for proper oral hygiene  Diabetic Foot Exam: Diabetic Foot Exam: Completed 05/23/22  Community Resource Referral / Chronic Care Management: CRR required this visit?  No   CCM required this visit?  Appt made with PCP for PE    Plan:     I have personally reviewed and noted the following in the patient's chart:   Medical and social history Use of alcohol, tobacco or illicit drugs  Current medications and supplements including opioid prescriptions. Patient is not currently taking opioid prescriptions. Functional ability and status Nutritional status Physical activity Advanced directives List of other physicians Hospitalizations, surgeries, and ER visits in previous 12 months Vitals Screenings to include cognitive, depression, and falls Referrals and appointments  In addition, I have reviewed and discussed with patient certain preventive protocols, quality metrics, and best practice recommendations. A written personalized care plan for preventive services as well as general preventive health recommendations were provided to patient.    Sue Lush, LPN   [id number]/[id number]   After Visit Summary: (MyChart) Due to this being a telephonic visit, the after visit summary with  patients personalized plan was offered to patient via MyChart   Nurse Notes: Pt relays sustaining a fall 6 weeks ago injuring right shoulder. She relays she is doing exercises to help but still has some pain. She has no other concerns or questions at this time.  *MMG order placed

## 2023-01-30 NOTE — Patient Instructions (Addendum)
Deborah Alvarado , Thank you for taking time to come for your Medicare Wellness Visit. I appreciate your ongoing commitment to your health goals. Please review the following plan we discussed and let me know if I can assist you in the future.   Referrals/Orders/Follow-Ups/Clinician Recommendations:   You have an order for:  []   2D Mammogram  [x]   3D Mammogram  []   Bone Density     Please call for appointment:  [facility]  [address] [place] [address] [phone]    Make sure to wear two-piece clothing.  No lotions, powders, or deodorants the day of the appointment. Make sure to bring picture ID and insurance card.  Bring list of medications you are currently taking including any supplements.   Schedule your  screening mammogram through MyChart!   Log into your MyChart account.  Go to 'Visit' (or 'Appointments' if on mobile App) --> Schedule an Appointment  Under 'Select a Reason for Visit' choose the Mammogram Screening option.  Complete the pre-visit questions and select the time and place that best fits your schedule.    This is a list of the screening recommended for you and due dates:  Health Maintenance  Topic Date Due   Pneumonia Vaccine (3 of 3 - PPSV23 or PCV20) 01/05/2020   Flu Shot  10/10/2022   COVID-19 Vaccine (6 - 2023-24 season) 11/10/2022   Hemoglobin A1C  11/23/2022   Yearly kidney function blood test for diabetes  12/14/2022   Yearly kidney health urinalysis for diabetes  02/16/2023   Complete foot exam   05/23/2023   Colon Cancer Screening  11/06/2023   Eye exam for diabetics  11/20/2023   Medicare Annual Wellness Visit  01/30/2024   Mammogram  03/08/2024   DTaP/Tdap/Td vaccine (3 - Td or Tdap) 02/07/2027   DEXA scan (bone density measurement)  Completed   Hepatitis C Screening  Completed   Zoster (Shingles) Vaccine  Completed   HPV Vaccine  Aged Out    Advanced directives:  (Copy Requested) Please bring a copy of your health care power of attorney and living will to the office to be added to your chart at your convenience.  Next Medicare Annual Wellness Visit scheduled for next year: Yes 02/02/24 @ 8:50am telephone

## 2023-01-30 NOTE — Addendum Note (Signed)
Addended by: Alvina Chou on: 01/30/2023 10:11 AM   Modules accepted: Orders

## 2023-01-31 DIAGNOSIS — M75121 Complete rotator cuff tear or rupture of right shoulder, not specified as traumatic: Secondary | ICD-10-CM | POA: Diagnosis not present

## 2023-02-11 ENCOUNTER — Other Ambulatory Visit: Payer: Self-pay | Admitting: Pharmacist

## 2023-02-11 NOTE — Progress Notes (Signed)
Called Ms Paulman to follow up on her incomplete [employer] PAP application for Humalog.  Currently approved for Trulicity and Mariella Saa, have been working to add on Humalog though awaiting patient income estimate and signature.   Patient lives in a household of 2 with her husband and income includes SS only. Estimates $1000/month personally, and ~$2000/month for her husband.   Annual income estimate ~$36,000.   Lilly application for Humalog submitted online with patient today. Case Number: [id number]  Loree Fee, PharmD Clinical Pharmacist [facility] [phone]

## 2023-02-13 ENCOUNTER — Ambulatory Visit: Payer: Medicare Other | Admitting: Family Medicine

## 2023-02-14 ENCOUNTER — Ambulatory Visit: Payer: Self-pay

## 2023-02-14 NOTE — Patient Outreach (Signed)
  Care Coordination   Follow Up Visit Note   02/14/2023 Name: Deborah Alvarado MRN: [id number] DOB: [birth year]  Deborah Alvarado is a 72 y.o. year old female who sees [name], MD for primary care. I  spoke with patients spouse, Kynzlee Kassner  What matters to the patients health and wellness today?  Patient attempted to talk with RNCM but unable to hear voice due to laryngitis like symptoms.  Patient coughing repeatedly while on phone as well.  Patients husband Roxianne Southward gets on the call with RNCM.  Husband states patient is coughing, having chest congestion and lost her voice today and overall doesn't feel well.  He states patient started with these symptoms on yesterday.  Denies patient having a fever.    Goals Addressed             This Visit's Progress    Care coordination activities       Interventions Today    Flowsheet Row Most Recent Value  Chronic Disease   Chronic disease during today's visit Other  [cold like symptoms.]  General Interventions   General Interventions Discussed/Reviewed General Interventions Reviewed, Doctor Visits  [Assessed for cold like symptoms. Assessed for severe breathing concerns that would warrant ED visit/ EMS call.]  Doctor Visits Discussed/Reviewed --  [name] to call primary care provider office today to report symptoms and request either a virtual visit or send message to provider regarding symptoms.]  Education Interventions   Education Provided Provided Education  [Advised to call 911 for severe SOB / symptom's.]              SDOH assessments and interventions completed:  No     Care Coordination Interventions:  Yes, provided   Follow up plan: Follow up call scheduled for 02/21/23    Encounter Outcome:  Patient Visit Completed   [name] RN,BSN,CCM [facility]  [facility], [facility] coordinator / Case Manager Phone: [phone]

## 2023-02-14 NOTE — Patient Instructions (Signed)
Visit Information  Thank you for taking time to visit with me today. Please don't hesitate to contact me if I can be of assistance to you.   Following are the goals we discussed today:   Goals Addressed             This Visit's Progress    Care coordination activities       Interventions Today    Flowsheet Row Most Recent Value  Chronic Disease   Chronic disease during today's visit Other  [cold like symptoms.]  General Interventions   General Interventions Discussed/Reviewed General Interventions Reviewed, Doctor Visits  [Assessed for cold like symptoms. Assessed for severe breathing concerns that would warrant ED visit/ EMS call.]  Doctor Visits Discussed/Reviewed --  Deborah Alvarado to call primary care provider office today to report symptoms and request either a virtual visit or send message to provider regarding symptoms.]  Education Interventions   Education Provided Provided Education  [Advised to call 911 for severe SOB / symptom's.]              Our next appointment is by telephone on 02/21/23 at 2 pm  Please call the care guide team at [phone] if you need to cancel or reschedule your appointment.   If you are experiencing a Mental Health or Behavioral Health Crisis or need someone to talk to, please call the Suicide and Crisis Lifeline: 988 call 1-800-273-TALK (toll free, 24 hour hotline)  Patient verbalizes understanding of instructions and care plan provided today and agrees to view in MyChart. Active MyChart status and patient understanding of how to access instructions and care plan via MyChart confirmed with patient.     [name] RN,BSN,CCM [place]  [facility], [facility] coordinator / Case Manager Phone: [phone]

## 2023-02-15 DIAGNOSIS — R059 Cough, unspecified: Secondary | ICD-10-CM | POA: Diagnosis not present

## 2023-02-15 DIAGNOSIS — R070 Pain in throat: Secondary | ICD-10-CM | POA: Diagnosis not present

## 2023-02-15 DIAGNOSIS — H6692 Otitis media, unspecified, left ear: Secondary | ICD-10-CM | POA: Diagnosis not present

## 2023-02-15 DIAGNOSIS — Z20822 Contact with and (suspected) exposure to covid-19: Secondary | ICD-10-CM | POA: Diagnosis not present

## 2023-02-21 ENCOUNTER — Ambulatory Visit: Payer: Self-pay

## 2023-02-21 NOTE — Patient Outreach (Signed)
  Care Coordination   Follow Up Visit Note   02/21/2023 Name: Deborah Alvarado MRN: [id number] DOB: 02/20/1951  Deborah Alvarado is a 72 y.o. year old female who sees [name], MD for primary care. I spoke with  [name] by phone today.  What matters to the patients health and wellness today?  Patient reports ongoing cold like/ congestions symptoms. Patient states she took her last antibiotic today.  She states she is still having coughing and some congestion.  She reports yellow phlegm production from cough.  Patient states she is scheduled to follow up with her primary care provider on 02/26/23.  Patient reports blood sugars are ranging from 100-250's.  She reports noticing blood sugars are slightly higher since she has been sick with cold symptoms.     Goals Addressed             This Visit's Progress    education and management of diabetes with decrease in Hgb A1c by 2 points/ management of health conditions       Interventions Today    Flowsheet Row Most Recent Value  Chronic Disease   Chronic disease during today's visit Diabetes, Other  [right shoulder injury, cold/ congestion symptoms]  General Interventions   General Interventions Discussed/Reviewed General Interventions Reviewed, Doctor Visits  [evaluation of current treatment plan for diabetes, cold/ congestion like symptoms and patients adherence to plan as established by provider.  Assessed for BS readings and ongoing cold/ congestion symptoms.]  Doctor Visits Discussed/Reviewed Doctor Visits Reviewed  Annabell Sabal upcoming provider visits. Advised to report ongoing cold symptoms to provider at upcoming visit.]  Education Interventions   Education Provided Provided Education  [Advised patient to stay hydrated to help loosen phelgm, elevate head of bed slightly when sleeping.]  Provided Verbal Education On Blood Sugar Monitoring, Sick Day Rules, When to see the doctor  Pharmacy Interventions   Pharmacy  Dicussed/Reviewed Pharmacy Topics Reviewed  [reviewed medications. Advised to take medications as prescribed.]              SDOH assessments and interventions completed:  No     Care Coordination Interventions:  Yes, provided   Follow up plan: Follow up call scheduled for 03/12/22    Encounter Outcome:  Patient Visit Completed   [name] RN,BSN,CCM [facility]  [facility], [facility] coordinator / Case Manager Phone: [phone]

## 2023-02-21 NOTE — Patient Instructions (Signed)
Visit Information  Thank you for taking time to visit with me today. Please don't hesitate to contact me if I can be of assistance to you.   Following are the goals we discussed today:   Goals Addressed             This Visit's Progress    education and management of diabetes with decrease in Hgb A1c by 2 points/ management of health conditions       Interventions Today    Flowsheet Row Most Recent Value  Chronic Disease   Chronic disease during today's visit Diabetes, Other  [right shoulder injury, cold/ congestion symptoms]  General Interventions   General Interventions Discussed/Reviewed General Interventions Reviewed, Doctor Visits  [evaluation of current treatment plan for diabetes, cold/ congestion like symptoms and patients adherence to plan as established by provider.  Assessed for BS readings and ongoing cold/ congestion symptoms.]  Doctor Visits Discussed/Reviewed Doctor Visits Reviewed  Annabell Sabal upcoming provider visits. Advised to report ongoing cold symptoms to provider at upcoming visit.]  Education Interventions   Education Provided Provided Education  [Advised patient to stay hydrated to help loosen phelgm, elevate head of bed slightly when sleeping.]  Provided Verbal Education On Blood Sugar Monitoring, Sick Day Rules, When to see the doctor  Pharmacy Interventions   Pharmacy Dicussed/Reviewed Pharmacy Topics Reviewed  [reviewed medications. Advised to take medications as prescribed.]              Our next appointment is by telephone on 03/12/22 at 2:30 pm  Please call the care guide team at [phone] if you need to cancel or reschedule your appointment.   If you are experiencing a Mental Health or Behavioral Health Crisis or need someone to talk to, please call the Suicide and Crisis Lifeline: 988 call 1-800-273-TALK (toll free, 24 hour hotline)  Patient verbalizes understanding of instructions and care plan provided today and agrees to view in MyChart.  Active MyChart status and patient understanding of how to access instructions and care plan via MyChart confirmed with patient.     [name] RN,BSN,CCM [place]  [facility], [facility] coordinator / Case Manager Phone: [phone]

## 2023-02-25 ENCOUNTER — Encounter: Payer: Medicare Other | Admitting: Family Medicine

## 2023-02-25 DIAGNOSIS — J069 Acute upper respiratory infection, unspecified: Secondary | ICD-10-CM | POA: Diagnosis not present

## 2023-03-04 ENCOUNTER — Other Ambulatory Visit: Payer: Self-pay | Admitting: Family Medicine

## 2023-03-10 ENCOUNTER — Encounter: Payer: Medicare Other | Admitting: Family Medicine

## 2023-03-13 ENCOUNTER — Ambulatory Visit: Payer: Self-pay

## 2023-03-13 NOTE — Patient Outreach (Signed)
  Care Coordination   03/13/2023 Name: Deborah Alvarado MRN: [id number] DOB: Oct 21, 1950   Care Coordination Outreach Attempts:  Attempted telephone outreach to patients home number x 2.  Unable to reach. Phone only rang. Telephone call to patient at listed mobile number.  Patient states she is having issues with her mobile number. She states she doesn't  answer her home number.  Patient states she is not at home at this time and request return call on another day. Appointment date and time rescheduled for 03/20/2023 at 2:30 pm    Follow Up Plan:  Additional outreach attempts will be made to offer the patient complex care management information and services.   Encounter Outcome:  Patient Request to Call Back   Care Coordination Interventions:  No, not indicated    [name] RN,BSN,CCM [facility]  [facility], [facility] coordinator / Case Manager Phone: [phone]

## 2023-03-20 ENCOUNTER — Ambulatory Visit: Payer: Self-pay

## 2023-03-20 NOTE — Patient Instructions (Signed)
 Visit Information  Thank you for taking time to visit with me today. Please don't hesitate to contact me if I can be of assistance to you.   Following are the goals we discussed today:   Goals Addressed             This Visit's Progress    education and management of diabetes with decrease in Hgb A1c by 2 points/ management of health conditions       Interventions Today    Flowsheet Row Most Recent Value  Chronic Disease   Chronic disease during today's visit Diabetes, Other  [right shoulder injury/ pain/ cold symptoms, congestion]  General Interventions   General Interventions Discussed/Reviewed General Interventions Reviewed, Doctor Visits  [evaluation of current treatment plan for DM, shoulder pain, cold symptoms and patients adherence to plan as estabished by provider. Assessed for Blood sugars and ongoing shoulder pain and/ or cold symptoms.]  Doctor Visits Discussed/Reviewed Doctor Visits Reviewed  bethann upcoming provider visits. Advised to keep follow up appointments with providers as recommended.]  Exercise Interventions   Exercise Discussed/Reviewed Physical Activity  Physical Activity Discussed/Reviewed Physical Activity Reviewed  [Advised to increase exercise as tolerated.]  Education Interventions   Education Provided Provided Education  [Advised to notify provider for any new or ongoing symptoms.]  Nutrition Interventions   Nutrition Discussed/Reviewed Carbohydrate meal planning, Nutrition Discussed  [Advised to eat low carbohydrate and low salt meals, watch portion sizes and avoid sugar sweetened drinks.]  Pharmacy Interventions   Pharmacy Dicussed/Reviewed Pharmacy Topics Reviewed  [medication reviewed and compliance discussed.  Offered to send Morna Kobs pharmacist message to call patient and discussed recently approved medication assistance for Trulicity  and Basalgar.  Advised to take medications as prescribed.]              Our next appointment is by  telephone on 04/23/23 at 2 pm  Please call the care guide team at [phone] if you need to cancel or reschedule your appointment.   If you are experiencing a Mental Health or Behavioral Health Crisis or need someone to talk to, please call the Suicide and Crisis Lifeline: 988 call 1-800-273-TALK (toll free, 24 hour hotline)  Patient verbalizes understanding of instructions and care plan provided today and agrees to view in MyChart. Active MyChart status and patient understanding of how to access instructions and care plan via MyChart confirmed with patient.     Arvin Seip RN,BSN,CCM [place]  Value[facility]Based Care Institute, [facility] coordinator / Case Manager Phone: [phone]

## 2023-03-20 NOTE — Patient Outreach (Signed)
  Care Coordination   Follow Up Visit Note   03/20/2023 Name: Deborah Alvarado MRN: [id number] DOB: [birth year]  Deborah Alvarado is a 72 y.o. year old female who sees Cleatus Arlyss RAMAN, MD for primary care. I spoke with  Deborah Alvarado by phone today.  What matters to the patients health and wellness today?  Patient states she still has some clear phlegm and cough at hs.  Patient reports having her COVID shot this week.  Patient states her right shoulder still nags her at time if she over exerts herself. Patient states she is walking 20 minutes per day.  She reports her blood sugar still runs a little high in the morning.  She states her blood sugar range is 100-250.  She states her blood sugar is a little high in the morning because of what she may eat at night.  Patient states, I'm trying.  Patient states she is not taking her Basaglar  consistently due to affordability.  Per pharmacy note on 02/11/23 medications Trulicity  and Basaglar  have been currently approved for medication assistance. Offered to have practice pharmacist call patient to clarify medications approved for assistance.    Goals Addressed             This Visit's Progress    education and management of diabetes with decrease in Hgb A1c by 2 points/ management of health conditions       Interventions Today    Flowsheet Row Most Recent Value  Chronic Disease   Chronic disease during today's visit Diabetes, Other  [right shoulder injury/ pain/ cold symptoms, congestion]  General Interventions   General Interventions Discussed/Reviewed General Interventions Reviewed, Doctor Visits  [evaluation of current treatment plan for DM, shoulder pain, cold symptoms and patients adherence to plan as estabished by provider. Assessed for Blood sugars and ongoing shoulder pain and/ or cold symptoms.]  Doctor Visits Discussed/Reviewed Doctor Visits Reviewed  bethann upcoming provider visits. Advised to keep follow up appointments with providers as  recommended.]  Exercise Interventions   Exercise Discussed/Reviewed Physical Activity  Physical Activity Discussed/Reviewed Physical Activity Reviewed  [Advised to increase exercise as tolerated.]  Education Interventions   Education Provided Provided Education  [Advised to notify provider for any new or ongoing symptoms.]  Nutrition Interventions   Nutrition Discussed/Reviewed Carbohydrate meal planning, Nutrition Discussed  [Advised to eat low carbohydrate and low salt meals, watch portion sizes and avoid sugar sweetened drinks.]  Pharmacy Interventions   Pharmacy Dicussed/Reviewed Pharmacy Topics Reviewed  [medication reviewed and compliance discussed.  Offered to send Morna Kobs pharmacist message to call patient and discussed recently approved medication assistance for Trulicity  and Basalgar.  Advised to take medications as prescribed.]              SDOH assessments and interventions completed:  No     Care Coordination Interventions:  Yes, provided   Follow up plan: Follow up call scheduled for 04/23/23 at 2 pm    Encounter Outcome:  Patient Visit Completed   Greogory Cornette RN,BSN,CCM [facility]  Value-Based Care Institute, [facility] coordinator / Case Manager Phone: [phone]

## 2023-03-27 ENCOUNTER — Other Ambulatory Visit: Payer: Self-pay | Admitting: Pharmacist

## 2023-03-27 ENCOUNTER — Encounter: Payer: Self-pay | Admitting: Pharmacist

## 2023-03-27 DIAGNOSIS — E119 Type 2 diabetes mellitus without complications: Secondary | ICD-10-CM

## 2023-03-27 MED ORDER — INSULIN LISPRO (1 UNIT DIAL) 100 UNIT/ML (KWIKPEN): PEN_INJECTOR | SUBCUTANEOUS | 6 refills | Status: DC

## 2023-03-27 MED ORDER — BASAGLAR KWIKPEN 100 UNIT/ML ~~LOC~~ SOPN: 5.0000 [IU] | PEN_INJECTOR | Freq: Every day | SUBCUTANEOUS | 5 refills | Status: DC

## 2023-03-27 MED ORDER — TRULICITY 4.5 MG/0.5ML ~~LOC~~ SOAJ: SUBCUTANEOUS | 3 refills | Status: DC

## 2023-03-27 NOTE — Progress Notes (Addendum)
 Manufacturer Assistance Program (MAP) Application   Manufacturer: Secretary/administrator    (Re-enrollment) Medication(s): Trulicity, Hospital doctor, Humalog (new)  Patient Portion of Application:  Submitted online [phone]) Submitted again online [phone]) Income Documentation: N/A - Electronic verification elected.~36,000  Provider Portion of Application:  Faxed to clinic though never made it to PCP 03/27/23: PCP pages re-submitted online. Rx pended to PCP in chart Prescription(s): Electronic Rx sent to [facility]) Trulicity 4.5 mg weekly Basaglar 5-20 unit daily Humalog (new) 0-10 unit TIDAC as instructed by provider   Application Status: Submitted via fax (pending approval) 2/24: CPhT Team called [employer] to follow up. They deny receiving any renewal application..  2/25: Re-printed provider pages and confirmation email stating patient pages have been submitted - Faxed to [id number]) 3/25: Re-submitted new patient application.. [phone])  APPROVED through 03/10/2024  Next Steps: [x]    Patient signature (online) [x]    eRx to be sent to Electronic Rx sent to [facility]). Pended to PCP 03/27/23 [x]    Confirmation of approval received via phone   Cc'd [facility] CPhT Medication Advocate Team for future correspondences/re-enrollments

## 2023-04-07 ENCOUNTER — Ambulatory Visit: Payer: Medicare Other | Admitting: Podiatry

## 2023-04-23 ENCOUNTER — Ambulatory Visit: Payer: Self-pay

## 2023-04-23 NOTE — Patient Outreach (Signed)
  Care Coordination   04/23/2023 Name: Deborah Alvarado MRN: [id number] DOB: 1950-08-28   Care Coordination Outreach Attempts:  An unsuccessful outreach was attempted for an appointment today. Attempted call to listed mobile number.  Unable to reach. HIPAA compliant message left with return call phone number.  Attempted call to listed home phone number.  Unable to reach patient or leave voice message. Phone only rang.   Follow Up Plan:  Additional outreach attempts will be made to offer the patient complex care management information and services.   Encounter Outcome:  No Answer   Care Coordination Interventions:  No, not indicated    [name] RN, BSN, CCM [employer], Population Health Case Manager Phone: [phone]

## 2023-04-29 ENCOUNTER — Ambulatory Visit
Admission: RE | Admit: 2023-04-29 | Discharge: 2023-04-29 | Disposition: A | Discharge status: Home / Self Care | Payer: Medicare Other | Source: Ambulatory Visit | Attending: Family Medicine | Admitting: Family Medicine

## 2023-04-29 DIAGNOSIS — Z1231 Encounter for screening mammogram for malignant neoplasm of breast: Secondary | ICD-10-CM | POA: Diagnosis not present

## 2023-05-05 ENCOUNTER — Telehealth: Payer: Self-pay

## 2023-05-05 NOTE — Telephone Encounter (Signed)
 CALL THE COMPANY TO FOLLOWED [employer] CARES SAID THAT SHE DOES NOT HAVE AN ACTIVE OR RENAL  APPLICATION ON FILE FOR TRULICITY, BASAGLAR, OR HUMALOG AND NEW APP WOULD NEED TO SUBMITTED.    PLEASE BE ADVISE APPLICATION MAY BE WITH PROVIDER OR PT. SPREAD SHEET SAID IT WAS SUBMITTED.  PENDING APPROVAL E-FILED.

## 2023-05-12 ENCOUNTER — Other Ambulatory Visit: Payer: Self-pay

## 2023-05-12 ENCOUNTER — Ambulatory Visit (INDEPENDENT_AMBULATORY_CARE_PROVIDER_SITE_OTHER): Payer: Medicare Other | Admitting: Family Medicine

## 2023-05-12 ENCOUNTER — Encounter: Payer: Self-pay | Admitting: Family Medicine

## 2023-05-12 VITALS — BP 136/74 | HR 100 | Temp 98.1°F | Ht 62.28 in | Wt 160.0 lb

## 2023-05-12 DIAGNOSIS — Z7984 Long term (current) use of oral hypoglycemic drugs: Secondary | ICD-10-CM

## 2023-05-12 DIAGNOSIS — M8580 Other specified disorders of bone density and structure, unspecified site: Secondary | ICD-10-CM | POA: Diagnosis not present

## 2023-05-12 DIAGNOSIS — Z7985 Long-term (current) use of injectable non-insulin antidiabetic drugs: Secondary | ICD-10-CM

## 2023-05-12 DIAGNOSIS — K219 Gastro-esophageal reflux disease without esophagitis: Secondary | ICD-10-CM | POA: Diagnosis not present

## 2023-05-12 DIAGNOSIS — Z0000 Encounter for general adult medical examination without abnormal findings: Secondary | ICD-10-CM

## 2023-05-12 DIAGNOSIS — I10 Essential (primary) hypertension: Secondary | ICD-10-CM

## 2023-05-12 DIAGNOSIS — E119 Type 2 diabetes mellitus without complications: Secondary | ICD-10-CM | POA: Diagnosis not present

## 2023-05-12 DIAGNOSIS — Z7189 Other specified counseling: Secondary | ICD-10-CM

## 2023-05-12 DIAGNOSIS — R8290 Unspecified abnormal findings in urine: Secondary | ICD-10-CM | POA: Diagnosis not present

## 2023-05-12 DIAGNOSIS — E785 Hyperlipidemia, unspecified: Secondary | ICD-10-CM | POA: Diagnosis not present

## 2023-05-12 MED ORDER — FAMOTIDINE 20 MG PO TABS: 20.0000 mg | ORAL_TABLET | Freq: Every day | ORAL | Status: AC | PRN

## 2023-05-12 MED ORDER — GLIMEPIRIDE 4 MG PO TABS: 4.0000 mg | ORAL_TABLET | Freq: Every day | ORAL | 3 refills | Status: DC

## 2023-05-12 NOTE — Patient Instructions (Addendum)
 Go to the lab on the way out.   If you have mychart we'll likely use that to update you.   Let me see about options when I get your labs.   Take care.  Glad to see you.  Try famotidine for heartburn.   See med list.

## 2023-05-12 NOTE — Progress Notes (Unsigned)
 covid booster prev done.  Flu 2024 shingrix up to date PNA vaccine prev done Tetanus 2018 RSV prev done.  Mammogram 2025  DXA 2022, okay to defer 2025 Living will d/w pt.  Husband designated if patient were incapacitated Colonoscopy 2020  Diabetes:  Using medications without difficulties: see below.   Hypoglycemic episodes: no Hyperglycemic episodes:see below Feet problems: some tingling.  Blood Sugars averaging: 170 this AM.  Averaging ~190 over the last 90 days, higher in the afternoon.   eye exam within last year: yes She had been taking 1000mg  BID, d/w pt about trying 500mg  BID to limit GI upset.  Still taking trulicity 4.5mg  per week.  She isn't on insulin at this point.  D/w pt about taking glimepiride 4mg  every day, not BID.    She had noted sig urine odor w/o pain. D/w pt.  See orders.    Hypertension:    Using medication without problems or lightheadedness: yes Chest pain with exertion:no Edema:no Short of breath:no  R shoulder pain after a fall.  She tripped in a hole.  She had prev injection, she wants to continue with exercises and try to avoid surgery.  She can update me as needed.   Elevated Cholesterol: Using medications without problems:yes Muscle aches: no Diet compliance: d/w pt.  Exercise:  yes Labs pending.   Frequent indigestion, d/w pt about options. Cutting back on metformin may help.  Prilosec helped some.  No black or blood stools.    PMH and SH reviewed.   Vital signs, Meds and allergies reviewed.  ROS: Per HPI unless specifically indicated in ROS section   GEN: nad, alert and oriented HEENT: ncat NECK: supple w/o LA CV: rrr PULM: ctab, no inc wob ABD: soft, +bs EXT: no edema SKIN: no acute rash  Diabetic foot exam: Normal inspection No skin breakdown No calluses  Normal DP pulses Normal sensation to light tough and monofilament Nails normal

## 2023-05-13 LAB — COMPREHENSIVE METABOLIC PANEL
ALT: 33 U/L (ref 0–35)
AST: 20 U/L (ref 0–37)
Albumin: 4.6 g/dL (ref 3.5–5.2)
Alkaline Phosphatase: 77 U/L (ref 39–117)
BUN: 13 mg/dL (ref 6–23)
CO2: 27 meq/L (ref 19–32)
Calcium: 10.2 mg/dL (ref 8.4–10.5)
Chloride: 99 meq/L (ref 96–112)
Creatinine, Ser: 0.73 mg/dL (ref 0.40–1.20)
GFR: 83.11 mL/min (ref >60.00)
Glucose, Bld: 189 mg/dL — ABNORMAL HIGH (ref 70–99)
Potassium: 4.6 meq/L (ref 3.5–5.1)
Sodium: 138 meq/L (ref 135–145)
Total Bilirubin: 0.4 mg/dL (ref 0.2–1.2)
Total Protein: 6.7 g/dL (ref 6.0–8.3)

## 2023-05-13 LAB — VITAMIN B12: Vitamin B-12: 174 pg/mL — ABNORMAL LOW (ref 211–911)

## 2023-05-13 LAB — MICROALBUMIN / CREATININE URINE RATIO
Creatinine,U: 86.3 mg/dL
Microalb Creat Ratio: 12 mg/g (ref 0.0–30.0)
Microalb, Ur: 1 mg/dL (ref 0.0–1.9)

## 2023-05-13 LAB — LIPID PANEL
Cholesterol: 153 mg/dL (ref 0–200)
HDL: 49.1 mg/dL (ref >39.00)
LDL Cholesterol: 31 mg/dL (ref 0–99)
NonHDL: 104.03
Total CHOL/HDL Ratio: 3
Triglycerides: 364 mg/dL — ABNORMAL HIGH (ref 0.0–149.0)
VLDL: 72.8 mg/dL — ABNORMAL HIGH (ref 0.0–40.0)

## 2023-05-13 LAB — VITAMIN D 25 HYDROXY (VIT D DEFICIENCY, FRACTURES): VITD: 45.61 ng/mL (ref 30.00–100.00)

## 2023-05-13 LAB — HEMOGLOBIN A1C: Hgb A1c MFr Bld: 7.5 % — ABNORMAL HIGH (ref 4.6–6.5)

## 2023-05-14 ENCOUNTER — Encounter: Payer: Self-pay | Admitting: Family Medicine

## 2023-05-14 ENCOUNTER — Other Ambulatory Visit: Payer: Self-pay | Admitting: Family Medicine

## 2023-05-14 DIAGNOSIS — E538 Deficiency of other specified B group vitamins: Secondary | ICD-10-CM | POA: Insufficient documentation

## 2023-05-14 DIAGNOSIS — E119 Type 2 diabetes mellitus without complications: Secondary | ICD-10-CM

## 2023-05-14 DIAGNOSIS — K219 Gastro-esophageal reflux disease without esophagitis: Secondary | ICD-10-CM | POA: Insufficient documentation

## 2023-05-14 DIAGNOSIS — R8290 Unspecified abnormal findings in urine: Secondary | ICD-10-CM | POA: Insufficient documentation

## 2023-05-14 LAB — URINE CULTURE
MICRO NUMBER:: 16150601
SPECIMEN QUALITY:: ADEQUATE

## 2023-05-14 MED ORDER — VITAMIN B-12 1000 MCG PO TABS: 1000.0000 ug | ORAL_TABLET | Freq: Every day | ORAL | Status: DC

## 2023-05-14 MED ORDER — CEPHALEXIN 500 MG PO CAPS: 500.0000 mg | ORAL_CAPSULE | Freq: Three times a day (TID) | ORAL | 0 refills | Status: DC

## 2023-05-14 NOTE — Assessment & Plan Note (Signed)
 She had been taking 1000mg  BID, d/w pt about trying 500mg  BID to limit GI upset.  Still taking trulicity 4.5mg  per week.  She isn't on insulin at this point.  D/w pt about taking glimepiride 4mg  every day, not BID.   See notes on labs.

## 2023-05-14 NOTE — Assessment & Plan Note (Signed)
Living will d/w pt.  Husband designated if patient were incapacitated.  

## 2023-05-14 NOTE — Assessment & Plan Note (Signed)
 Already working on diet.  Can try famotidine.

## 2023-05-14 NOTE — Assessment & Plan Note (Signed)
 covid booster prev done.  Flu 2024 shingrix up to date PNA vaccine prev done Tetanus 2018 RSV prev done.  Mammogram 2025  DXA 2022, okay to defer 2025 Living will d/w pt.  Husband designated if patient were incapacitated Colonoscopy 2020

## 2023-05-14 NOTE — Assessment & Plan Note (Signed)
Continue simvastatin.  See notes on labs. 

## 2023-05-14 NOTE — Assessment & Plan Note (Signed)
 Continue losartan.

## 2023-05-14 NOTE — Assessment & Plan Note (Signed)
 See notes on labs. Abd not ttp.

## 2023-05-18 ENCOUNTER — Telehealth: Payer: Self-pay | Admitting: Family Medicine

## 2023-05-18 NOTE — Telephone Encounter (Signed)
 Please update patient- they will get a letter/mychart message about this.    The hospital system discovered a software error in a system at the laboratory at [facility] at [address] in [place] that examines kidney function. The patient's results were incorrectly calculated on this test.  The test is called uACR. It measures the amount of albumin (a protein) in the urine relative to creatinine (a waste product). This test is one of several factors used to judge kidney function. The laboratory software miscalculated the ratio of one to the other. The measurements themselves were correct. The software error has been fixed.  I went back and checked the patient's lab results and medicines.    At this point, it does not appear that the patient needs to make any medicine changes.  Her most recent kidney labs are normal.   If the patient has any questions or concerns about this, please schedule a visit/let me know.   Thanks.

## 2023-06-24 ENCOUNTER — Other Ambulatory Visit: Payer: Self-pay

## 2023-06-24 ENCOUNTER — Encounter: Payer: Self-pay | Admitting: Pharmacist

## 2023-06-24 NOTE — Progress Notes (Signed)
 Brief Telephone Documentation Reason for Call: [employer] Approval  Summary of Call: Called patient 4/15 to inform her of [employer] approval through the end of year  Patient confirms she has not run out of Trulicity, has continued 4.5 mg without issues.  Not using insulins currently though notes she would like to restart. Feels her sugars keep increasing more over time.   Patient is aware that insulins she was taking will be delivered with her Trulicity. Verbalized understanding she is not to start insulin until advised by myself or PCP.   Phone visit with PharmD scheduled 1 week to comprehensively review BG data / resumption of insulin as needed  Follow Up: Patient given direct line for further questions/concerns. 1 week phone visit w PharmD  Daron Ellen, PharmD Clinical Pharmacist [facility] [phone]

## 2023-07-01 ENCOUNTER — Other Ambulatory Visit: Payer: Self-pay | Admitting: Family Medicine

## 2023-07-02 ENCOUNTER — Other Ambulatory Visit

## 2023-07-02 DIAGNOSIS — E119 Type 2 diabetes mellitus without complications: Secondary | ICD-10-CM

## 2023-07-02 MED ORDER — METFORMIN HCL ER 500 MG PO TB24: 500.0000 mg | ORAL_TABLET | Freq: Two times a day (BID) | ORAL | 2 refills | Status: DC

## 2023-07-02 MED ORDER — BASAGLAR KWIKPEN 100 UNIT/ML ~~LOC~~ SOPN: PEN_INJECTOR | SUBCUTANEOUS | 11 refills | Status: DC

## 2023-07-02 MED ORDER — INSULIN LISPRO (1 UNIT DIAL) 100 UNIT/ML (KWIKPEN): PEN_INJECTOR | SUBCUTANEOUS | 3 refills | Status: DC

## 2023-07-02 NOTE — Progress Notes (Unsigned)
 07/02/2023 Name: Deborah Alvarado MRN: [id number] DOB: Mar 06, 1951  Subjective  No chief complaint on file.  Reason for visit: ?  Deborah Alvarado is a 73 y.o. female with a history of diabetes (type 2), who presents today for a diabetes pharmacotherapy visit.? Pertinent PMH also includes HTN, HLD, osteopenia.   They were referred to the pharmacist by their PCP for assistance in managing diabetes.   Care Team: Primary Care Provider: Donnie Galea, MD  Known DM Complications: no known complications    Date of Last Diabetes Related Visit: 05/12/23 with PCP   Action At Last Diabetes Related Visit: ?  3/3: Taking MTF 1000mg  BID, d/w pt about trying 500mg  BID to limit GI upset. D/w pt about taking glimepiride  4mg  every day, not BID.   Medication Access/Adherence  Current Patient Assistance: Secretary/administrator (Basaglar , Humalog , Trulicity )  - Denies missed doses of Trulicity   Since Last visit / History of Present Illness: ?  Patient reports stopping insulin  use several weeks ago. Cannot recall exact timeline. Has not received first shipment from [employer] yet though has received Trulicity  from [facility].  Continues Trulicity  4.5 mg weekly without concerns other than feeling that her blood sugar is elevated. Denies issues with diarrhea/constipation. Reports GERD has resolved since trying Pepcid .   Reported DM Regimen: ?  Metformin  IR 500 mg twice daily  Trulicity  4.5 mg sq once weekly Glimepiride  4 mg daily with breakfast   DM medications tried in the past:?  Lantus  10 units daily (stopped several weeks ago) Jardiance (yeast infection)  Reported Diet: Patient typically eats 2 meals per day.  Breakfast: skips, jello-o Lunch (12-1): Salad, cabbage/[employer]). If home, sandwich.  Dinner: Vegetables (green beans, onions, squash), chicken (grilled/baked) Snacks: Infrequent (jell-o) Beverages: Coffee (CoffeeMate Vanilla creamer - regular sugar). Lemon water, Propel  (sugar-free)  SMBG: ? Libre 3+ ([employer] cheapest)       Hypo/Hyperglycemia: ?  Symptoms of hypoglycemia since last visit:? no   DM Prevention:  Statin: Taking; moderate intensity.?  History of chronic kidney disease? no History of albuminuria? none>30, last UACR on 05/12/23 = 12 mg/g ACE/ARB - Taking losartan  50 mg; Urine MA/CR Ratio - normal.  Last eye exam: Not documented Last foot exam: 05/12/2023 Tobacco Use: Never  Cardiovascular Risk Reduction History of clinical ASCVD? no The 10-year ASCVD risk score (Arnett DK, et al., 2019) is: 24.2% History of heart failure? no History of hyperlipidemia? yes Current BMI: 29.0 kg/m2 (Ht 62.28 in, Wt 72.6 kg) Taking statin? yes; moderate intensity (simvastatin  40) Taking aspirin? not indicated; Not taking   Taking SGLT-2i? no Taking GLP- 1 RA? yes      _______________________________________________  Objective    Review of Systems:?  Constitutional:? No fever, chills or unintentional weight loss  Cardiovascular:? No chest pain or pressure, shortness of breath, dyspnea on exertion, orthopnea or LE edema  GI:? No nausea, vomiting, constipation, diarrhea, abdominal pain, dyspepsia, change in bowel habits  Endocrine:? No polyuria, polyphagia or blurred vision    Physical Examination:  Vitals:  Wt Readings from Last 3 Encounters:  05/12/23 160 lb (72.6 kg)  01/30/23 153 lb (69.4 kg)  05/23/22 160 lb (72.6 kg)   BP Readings from Last 3 Encounters:  05/12/23 136/74  05/23/22 138/84  04/30/22 (!) 134/92   Pulse Readings from Last 3 Encounters:  05/12/23 100  05/23/22 91  04/30/22 80     Labs:?  Lab Results  Component Value Date   HGBA1C 7.5 (H) 05/12/2023  HGBA1C 10.2 (A) 05/23/2022   HGBA1C 9.3 (H) 02/15/2022   GLUCOSE 189 (H) 05/12/2023   MICRALBCREAT 12.0 05/12/2023   MICRALBCREAT 16.2 02/15/2022   MICRALBCREAT 8.3 10/05/2008   CREATININE 0.73 05/12/2023   CREATININE 0.88 12/13/2021   CREATININE 0.81 03/20/2021    GFRNONAA 68.49 01/02/2010   GFRNONAA 78.66 04/12/2009   GFRNONAA 91.90 11/15/2008    Lab Results  Component Value Date   CHOL 153 05/12/2023   LDLDIRECT 84.0 03/20/2021   HDL 49.10 05/12/2023   HDL 45.00 12/13/2021   HDL 39.00 (L) 03/20/2021   AST 20 05/12/2023   AST 21 12/13/2021   ALT 33 05/12/2023   ALT 25 12/13/2021      Chemistry      Component Value Date/Time   NA 138 05/12/2023 1451   K 4.6 05/12/2023 1451   CL 99 05/12/2023 1451   CO2 27 05/12/2023 1451   BUN 13 05/12/2023 1451   CREATININE 0.73 05/12/2023 1451      Component Value Date/Time   CALCIUM 10.2 05/12/2023 1451   ALKPHOS 77 05/12/2023 1451   AST 20 05/12/2023 1451   ALT 33 05/12/2023 1451   BILITOT 0.4 05/12/2023 1451     The 10-year ASCVD risk score (Arnett DK, et al., 2019) is: 24.2%  Assessment and Plan:   1. Diabetes, type 2: improved A1c of 7.5% (05/12/23). Goal <7% without hypoglycemia. CGM data shows worsening of glycemic control 2/2 stopping insulin . 22% target, 46% high, 32% very high with GMI 8.8%. Fasting sugars consistently elevated, would benefit from resuming basal insulin . Would also benefit from mealtime with largest meal of the day per consistent/severe peaks following dinner.  Will re-establish on basal insulin  first, and defer prandial to next check-in, ~1 week after basal resumption.  Current Regimen: Trulicity  4.5 mg weekly, metformin  IR 500 mg BID, glimepiride  5 mg once daily Start basaglar  7 units once daily once delivered Rx pended to [employer] off on starting Humalog  (approved for [employer], Rx pended) Transition metformin  IR to metformin  ER as to prevent GI side effects  Reviewed signs/symptoms/treatment of hypoglycemia   Future Consideration: GLP1-RA: May consider transition to Ozempic pre greaer A1c lowering propensity to reduce need for insulin /sulfonylurea SGLT2i: Avoid in the setting of yeast infections on Jardiance Metformin : Trial ER metformin  to  achieve goal dose of 1000 mg BID  SU: With optmization of other therapies, ideally discontinue glimepiride   TZD: Avoiding due to possible weight gain/fracture risk.    Follow Up Follow up with clinical pharmacist via phone 1.5 weeks Humalog  start/Basaglar  titration Follow up with Donnie Galea, MD scheduled 1.5 months?  Future Appointments  Date Time Provider Department Center  07/11/2023  9:00 AM [facility] PHARMACIST [facility]  08/15/2023  9:30 AM Donnie Galea, MD [facility]  02/02/2024  8:50 AM [facility] ANNUAL WELLNESS VISIT 1 [facility]   Daron Ellen, PharmD Clinical Pharmacist [facility] [phone]

## 2023-07-03 ENCOUNTER — Other Ambulatory Visit

## 2023-07-11 ENCOUNTER — Other Ambulatory Visit (INDEPENDENT_AMBULATORY_CARE_PROVIDER_SITE_OTHER): Admitting: Pharmacist

## 2023-07-11 DIAGNOSIS — E119 Type 2 diabetes mellitus without complications: Secondary | ICD-10-CM

## 2023-07-11 NOTE — Progress Notes (Signed)
 07/11/2023 Name: Deborah Alvarado MRN: [id number] DOB: 1951-01-25  Subjective  No chief complaint on file.  Reason for visit: ?  Deborah Alvarado is a 73 y.o. female with a history of diabetes (type 2), who presents today for a diabetes pharmacotherapy visit.? Pertinent PMH also includes HTN, HLD, osteopenia.   They were referred to the pharmacist by their PCP for assistance in managing diabetes.   Care Team: Primary Care Provider: Donnie Galea, MD  Known DM Complications: no known complications    Date of Last Diabetes Related Visit: 05/12/23 with PCP 05/12/23, 4/23 with PharmD   Action At Last Diabetes Related Visit: ?  4/23: Restart basaglar  7 u/d.  Metformin  IR ? ER. Hold off on starting Humalog . 3/3: Taking MTF 1000mg  BID, d/w pt about trying 500mg  BID to limit GI upset. D/w pt about taking glimepiride  4mg  every day, not BID.   Medication Access/Adherence  Current Patient Assistance: Secretary/administrator (Basaglar , Humalog , Trulicity )  - Denies missed doses of Trulicity   Since Last visit / History of Present Illness: ?  Patient reports that she received insulin  pens that are "square" instead of round and therefore they do not fit on her pen needles. Has not restarted insulin  at this time.   Since last check-in, she confirms she received Humalog  in the mail from [facility], has not taken yet (per instruction). Now has all PAP medication in her possession.   Continues Trulicity  4.5 mg weekly without concerns other than feeling that her blood sugar is elevated. Denies issues with diarrhea/constipation.  Notes metformin  XR prescription was covered for similar cost as previous IR prescription. Notes tablets are bigger, though manageable.    Reported DM Regimen: ?  Metformin  ER 500 mg twice daily  Trulicity  4.5 mg sq once weekly Glimepiride  4 mg daily with breakfast (has not restarted)   DM medications tried in the past:?  Lantus  10 units daily (stopped several weeks ago) Jardiance  (yeast infection)   Reported Diet: Patient typically eats 2 meals per day.  Breakfast: skips, jello-o Lunch (12-1): Salad, cabbage/[employer]). If home, sandwich.  Dinner: Vegetables (green beans, onions, squash), chicken (grilled/baked) Snacks: Infrequent (jell-o) Beverages: Coffee (CoffeeMate Vanilla creamer - regular sugar). Lemon water, Propel (sugar-free)  SMBG: ? Libre 3+ ([facility] cheapest)     Hypo/Hyperglycemia: ?  Symptoms of hypoglycemia since last visit:? no   DM Prevention:  Statin: Taking; moderate intensity.?  History of chronic kidney disease? no History of albuminuria? none>30, last UACR on 05/12/23 = 12 mg/g ACE/ARB - Taking losartan  50 mg; Urine MA/CR Ratio - normal.  Last eye exam: Not documented Last foot exam: 05/12/2023 Tobacco Use: Never  Cardiovascular Risk Reduction History of clinical ASCVD? no The 10-year ASCVD risk score (Arnett DK, et al., 2019) is: 24.2% History of heart failure? no History of hyperlipidemia? yes Current BMI: 29.0 kg/m2 (Ht 62.28 in, Wt 72.6 kg) Taking statin? yes; moderate intensity (simvastatin  40) Taking aspirin? not indicated; Not taking   Taking SGLT-2i? no Taking GLP- 1 RA? yes      _______________________________________________  Objective    Review of Systems:?  Constitutional:? No fever, chills or unintentional weight loss  Cardiovascular:? No chest pain or pressure, shortness of breath, dyspnea on exertion, orthopnea or LE edema  GI:? No nausea, vomiting, constipation, diarrhea, abdominal pain, dyspepsia, change in bowel habits  Endocrine:? No polyuria, polyphagia or blurred vision    Physical Examination:  Vitals:  Wt Readings from Last 3 Encounters:  05/12/23 160 lb (  72.6 kg)  01/30/23 153 lb (69.4 kg)  05/23/22 160 lb (72.6 kg)   BP Readings from Last 3 Encounters:  05/12/23 136/74  05/23/22 138/84  04/30/22 (!) 134/92   Pulse Readings from Last 3 Encounters:  05/12/23 100  05/23/22 91   04/30/22 80     Labs:?  Lab Results  Component Value Date   HGBA1C 7.5 (H) 05/12/2023   HGBA1C 10.2 (A) 05/23/2022   HGBA1C 9.3 (H) 02/15/2022   GLUCOSE 189 (H) 05/12/2023   MICRALBCREAT 12.0 05/12/2023   MICRALBCREAT 16.2 02/15/2022   MICRALBCREAT 8.3 10/05/2008   CREATININE 0.73 05/12/2023   CREATININE 0.88 12/13/2021   CREATININE 0.81 03/20/2021   GFRNONAA 68.49 01/02/2010   GFRNONAA 78.66 04/12/2009   GFRNONAA 91.90 11/15/2008    Lab Results  Component Value Date   CHOL 153 05/12/2023   LDLDIRECT 84.0 03/20/2021   HDL 49.10 05/12/2023   HDL 45.00 12/13/2021   HDL 39.00 (L) 03/20/2021   AST 20 05/12/2023   AST 21 12/13/2021   ALT 33 05/12/2023   ALT 25 12/13/2021      Chemistry      Component Value Date/Time   NA 138 05/12/2023 1451   K 4.6 05/12/2023 1451   CL 99 05/12/2023 1451   CO2 27 05/12/2023 1451   BUN 13 05/12/2023 1451   CREATININE 0.73 05/12/2023 1451      Component Value Date/Time   CALCIUM 10.2 05/12/2023 1451   ALKPHOS 77 05/12/2023 1451   AST 20 05/12/2023 1451   ALT 33 05/12/2023 1451   BILITOT 0.4 05/12/2023 1451     The 10-year ASCVD risk score (Arnett DK, et al., 2019) is: 24.2%  Assessment and Plan:   1. Diabetes, type 2: improved A1c of 7.5% (05/12/23). though CGM data shows worsening of glycemic control 2/2 stopping insulin , anticipate next A1c to be slightly elevated. Goal <7% without hypoglycemia, reasonable target <7.5%. Did not resume basaglar  due to confusion with pen needle though resolved this confusion today.  FBG consistently upper 100s-200s. Sugar remains elevated throughout the day with largest and most consistent spike between 12pm-2pm daily. Proceed w previous plan, resume basaglar . Will review Libre next week with plan to add small Humalog  dose before afternoon meal only per peaks in the 400s.  Current Regimen: Trulicity  4.5 mg weekly, metformin  ER 500 mg BID, glimepiride  5 mg once daily Resume basaglar  at 7 units once  daily Reviewed signs/symptoms/treatment of hypoglycemia   Future Consideration: Next week: Start Humalog  2 units, fifteen min prior to afternoon meal GLP1-RA: May consider transition to Ozempic pre greaer A1c lowering propensity to reduce need for insulin /sulfonylurea SGLT2i: Avoid in the setting of yeast infections on Jardiance Metformin : Trial ER metformin  to achieve goal dose of 1000 mg BID  SU: With optmization of other therapies, ideally discontinue glimepiride   TZD: Avoiding due to possible weight gain/fracture risk.    Follow Up Follow up with clinical pharmacist via phone next week - Humalog  start/Basaglar  titration Follow up with Donnie Galea, MD scheduled 1.5 months?  Future Appointments  Date Time Provider Department Center  07/11/2023  9:00 AM [facility] [facility] PHARMACIST [facility]  08/15/2023  9:30 AM Donnie Galea, MD [facility]  02/02/2024  8:50 AM [facility] ANNUAL WELLNESS VISIT 1 [facility]   Daron Ellen, PharmD Clinical Pharmacist [facility] [phone]

## 2023-07-11 NOTE — Patient Instructions (Signed)
 Ms. Deborah Alvarado,   It was a pleasure to speak with you today! As we discussed:?   Start basaglar  7 units under the skin once daily. (This is the same long-acting insulin  you were taking previously). Continue all other medications as you have been taking them.  Trulicity  4.5 mg once weekly. Metformin  ER 500 mg (1 tablet twice daily with food) Do not start taking Humalog  yet. We will touch base next week and discuss Humalog  based on your [place] report.   Currently, you are not having low blood sugars but as a general reminder: How to treat low blood sugar:  - For blood sugar less than 70: Treat with 4 ounces of juice or regular soda, or with 3 to 4 glucose tablets.  - Re-check blood sugar in 15 minutes. ?  If blood sugar is still less than 70 on re-check, treat again and re-check in 15 minutes   Please reach out prior to your next scheduled appointment should you have any questions or concerns. [phone]. Thank you!   Future Appointments  Date Time Provider Department Center  08/15/2023  9:30 AM Donnie Galea, MD [facility]  02/02/2024  8:50 AM [facility] ANNUAL WELLNESS VISIT 1 [facility]    Daron Ellen, PharmD Clinical Pharmacist [facility] [phone]

## 2023-07-18 ENCOUNTER — Encounter: Payer: Self-pay | Admitting: Pharmacist

## 2023-07-18 NOTE — Progress Notes (Signed)
 Attempted to contact patient to review Deborah Alvarado data following recent medication change.  Left HIPAA compliant message for patient to return my call at their convenience. Direct callback number left on voicemail.   Daron Ellen, PharmD Clinical Pharmacist [facility] [place] Ph: [phone]

## 2023-08-15 ENCOUNTER — Ambulatory Visit (INDEPENDENT_AMBULATORY_CARE_PROVIDER_SITE_OTHER): Admitting: Family Medicine

## 2023-08-15 ENCOUNTER — Encounter: Payer: Self-pay | Admitting: Family Medicine

## 2023-08-15 VITALS — BP 134/82 | HR 94 | Temp 98.5°F | Ht 62.28 in | Wt 155.0 lb

## 2023-08-15 DIAGNOSIS — J0100 Acute maxillary sinusitis, unspecified: Secondary | ICD-10-CM | POA: Diagnosis not present

## 2023-08-15 DIAGNOSIS — M79603 Pain in arm, unspecified: Secondary | ICD-10-CM

## 2023-08-15 DIAGNOSIS — E538 Deficiency of other specified B group vitamins: Secondary | ICD-10-CM | POA: Diagnosis not present

## 2023-08-15 DIAGNOSIS — E119 Type 2 diabetes mellitus without complications: Secondary | ICD-10-CM

## 2023-08-15 LAB — HEMOGLOBIN A1C: Hgb A1c MFr Bld: 8.5 % — ABNORMAL HIGH (ref 4.6–6.5)

## 2023-08-15 MED ORDER — BENZONATATE 200 MG PO CAPS: 200.0000 mg | ORAL_CAPSULE | Freq: Three times a day (TID) | ORAL | 1 refills | Status: DC | PRN

## 2023-08-15 MED ORDER — BASAGLAR KWIKPEN 100 UNIT/ML ~~LOC~~ SOPN: PEN_INJECTOR | SUBCUTANEOUS | Status: DC

## 2023-08-15 MED ORDER — AMOXICILLIN-POT CLAVULANATE 875-125 MG PO TABS: 1.0000 | ORAL_TABLET | Freq: Two times a day (BID) | ORAL | 0 refills | Status: DC

## 2023-08-15 NOTE — Patient Instructions (Addendum)
 Go to the lab on the way out.   If you have mychart we'll likely use that to update you.    Take care.  Glad to see you.  Let me see about the meal time insulin  options when I see your labs.    Let me know which pain meds helped in the meantime and I'll see about options.   Start augmentin  in the meantime. Tessalon for cough. Rest and fluids.

## 2023-08-15 NOTE — Progress Notes (Signed)
 B12 recheck pending.  Taking B12 by mouth.  See notes on labs.  Diabetes:  Using medications without difficulties:yes Hypoglycemic episodes:no Hyperglycemic episodes: Feet problems: no Blood Sugars averaging: usually 300s while sick.  Likely lower when she was feeling well.   Sugar 185 at OV.   eye exam within last year:yes She is trying to limit carbs.    Not yet on lispro.  I will to update Zink with pharmacy about labs and situation.  Discussed.  She is putting up with R shoulder and arm pain.  D/w pt about ortho follow up when possible and med use, see AVS.    Uri sx.  Started about 2 weeks ago.  Sick contact with granddaughter.  No fevers.  Cough, sputum.  Fatigued.  Rhinorrhea.  Some ear discomfort.  Some occ wheeze.  Can still take a deep breath.  Frontal sinus pain/pressure.   PMH and SH reviewed  Meds, vitals, and allergies reviewed.   ROS: Per HPI unless specifically indicated in ROS section   GEN: nad, alert and oriented HEENT: mucous membranes moist, tm w/o erythema, nasal exam w/o erythema, clear discharge noted,  OP with cobblestoning NECK: supple w/o LA CV: rrr.   PULM: ctab, no inc wob, cough noted.  EXT: no edema SKIN: well perfused.  Sinuses ttp x4.

## 2023-08-17 DIAGNOSIS — M79603 Pain in arm, unspecified: Secondary | ICD-10-CM | POA: Insufficient documentation

## 2023-08-17 NOTE — Assessment & Plan Note (Signed)
 See notes on labs. Not yet on lispro.  I will to update Zink with pharmacy about labs and situation.  Discussed.

## 2023-08-17 NOTE — Assessment & Plan Note (Signed)
 See notes on labs.

## 2023-08-17 NOTE — Assessment & Plan Note (Signed)
 Nontoxic.  Okay for outpatient follow-up. Start augmentin  in the meantime. Tessalon  for cough. Rest and fluids.

## 2023-08-17 NOTE — Assessment & Plan Note (Signed)
 I asked her to check her medications at home and let me know which medication had been most helpful.  We talked about her seeing Ortho when possible.

## 2023-08-20 ENCOUNTER — Telehealth: Payer: Self-pay | Admitting: Family Medicine

## 2023-08-20 NOTE — Telephone Encounter (Signed)
 There is a lab reporting delay.  B12 pending. A1c 8.5.  My understanding is she is not yet on lispro.  Please contact patient about her re: A1c.    I routed this to [facility] as FYI re: A1c and for input. I think it likely makes sense to start lispro but I wanted her input.  Thanks.

## 2023-08-21 ENCOUNTER — Other Ambulatory Visit: Payer: Self-pay | Admitting: Pharmacist

## 2023-08-21 LAB — VITAMIN B12: Vitamin B-12: 1243 pg/mL — ABNORMAL HIGH (ref 211–911)

## 2023-08-21 NOTE — Progress Notes (Signed)
 Brief Telephone Documentation Reason for Call: Previous No show - re-schedule with Pharmacist for DM review  Summary of Call: Spoke with patient today. She reports tomorrow would be a better time to review sugars and her new prandial insulin .   Scheduled for phone visit Friday 6/13.   Follow Up: Patient given direct line for further questions/concerns.  Daron Ellen, PharmD Clinical Pharmacist [facility] [phone]

## 2023-08-22 ENCOUNTER — Encounter: Payer: Self-pay | Admitting: Family Medicine

## 2023-08-22 ENCOUNTER — Other Ambulatory Visit (INDEPENDENT_AMBULATORY_CARE_PROVIDER_SITE_OTHER): Admitting: Pharmacist

## 2023-08-22 ENCOUNTER — Other Ambulatory Visit

## 2023-08-22 DIAGNOSIS — E119 Type 2 diabetes mellitus without complications: Secondary | ICD-10-CM

## 2023-08-22 MED ORDER — PEN NEEDLES 31G X 5 MM MISC: 3 refills | Status: AC

## 2023-08-22 MED ORDER — FLUCONAZOLE 150 MG PO TABS: 150.0000 mg | ORAL_TABLET | Freq: Once | ORAL | 0 refills | Status: AC

## 2023-08-22 NOTE — Patient Instructions (Addendum)
 Ms. Deborah Alvarado,   It was a pleasure to speak with you today! As we discussed:?   Increase basaglar  to 11 units once daily.  If after 3-4 days your morning sugars before food/drink remain over 140 mg/dL each day, you may increase again to 12 units daily.   Start Humlaog (mealtime insulin ) once daily for now.  Humalog  3 units ten to fifteen minutes prior lunch meal (lunch). Skip Humalog  if you skip your meal.  Insulin  (once opened) can be stored safely at room temperature for 30 days.   Continue all other medications as you have been taking them A refill for pen needles has been sent to Dr. Vallarie Gauze to sign.    Please MyChart message me or leave a voicemail [phone]) if you have any concerns of blood sugar readings that are less than 70, or if any other concerns.   Have a good beach weekend!   Future Appointments  Date Time Provider Department Center  09/03/2023  9:30 AM [facility] PHARMACIST [facility]  02/02/2024  8:50 AM [facility] ANNUAL WELLNESS VISIT 1 [facility]    Daron Ellen, PharmD Clinical Pharmacist [facility] [phone]

## 2023-08-22 NOTE — Progress Notes (Signed)
 08/22/2023 Name: Deborah Alvarado MRN: [id number] DOB: 1950/05/18  Subjective  Chief Complaint  Patient presents with   Diabetes   Reason for visit: ?  Deborah Alvarado is a 73 y.o. female with a history of diabetes (type 2), who presents today for a diabetes pharmacotherapy visit.? Pertinent PMH also includes HTN, HLD, osteopenia.   They were referred to the pharmacist by their PCP for assistance in managing diabetes.   Care Team: Primary Care Provider: Donnie Galea, MD  Known DM Complications: no known complications    Action At Last Diabetes Related Visit: ?  5/9: No show. Left message.  4/23: Restart basaglar  7 u/d.  Metformin  IR ? ER. Hold off on starting Humalog  until next check in.  3/3: Taking MTF 1000mg  BID, d/w pt about trying 500mg  BID to limit GI upset. D/w pt about taking glimepiride  4mg  every day, not BID.   Medication Access/Adherence  Current Patient Assistance: Secretary/administrator (Basaglar , Humalog , Trulicity )  - Denies missed doses of Trulicity   Since Last visit / History of Present Illness: ?  Patient reports doing well today. Confirms compliance with DM medications. Has increased her basaglar  to 10 units daily for hyperglycemia as instructed.   Denies issues with diarrhea/constipation on Trulicity . No c/f lows at this time  Reported DM Regimen: ?  Metformin  ER 500 mg twice daily  Trulicity  4.5 mg sq once weekly Glimepiride  4 mg daily with breakfast Basaglar  10 units daily (Last increase ~2 weeks ago)   DM medications tried in the past:?  Jardiance (yeast infection)  Reported Diet: Patient typically eats 2 meals per day.  Breakfast: skips, jello-o Lunch (12-1): Salad, cabbage/[employer]). If home, sandwich.  Dinner: Vegetables (green beans, onions, squash), chicken (grilled/baked) Snacks: Infrequent (jell-o) Beverages: Coffee (CoffeeMate Vanilla creamer - regular sugar). Lemon water, Propel (sugar-free)  SMBG: ? Libre 3+ ([facility]  cheapest)    CGM Report (Date Range 5/31 - 6/13) ABG 238 mg/dL; GMI 3.8%; Variability 26.7%; TIR 18%, high 45%, very high 37%, low 0%, very low 0%     Hypo/Hyperglycemia: ?  Symptoms of hypoglycemia since last visit:? no  Symptoms of Hyperglycemia: No   DM Prevention:  Statin: Taking; moderate intensity.?  History of chronic kidney disease? no History of albuminuria? none>30, last UACR on 05/12/23 = 12 mg/g ACE/ARB - Taking losartan  50 mg; Urine MA/CR Ratio - normal.  Last eye exam: Not documented Last foot exam: 05/12/2023 Tobacco Use: Never  Cardiovascular Risk Reduction History of clinical ASCVD? no The 10-year ASCVD risk score (Arnett DK, et al., 2019) is: 26.2% History of heart failure? no History of hyperlipidemia? yes Current BMI: 29.0 kg/m2 (Ht 62.28 in, Wt 72.6 kg) Taking statin? yes; moderate intensity (simvastatin  40) Taking aspirin? not indicated; Not taking   Taking SGLT-2i? no Taking GLP- 1 RA? yes      _______________________________________________  Objective     Vitals:  Wt Readings from Last 3 Encounters:  08/15/23 155 lb (70.3 kg)  05/12/23 160 lb (72.6 kg)  01/30/23 153 lb (69.4 kg)   BP Readings from Last 3 Encounters:  08/15/23 134/82  05/12/23 136/74  05/23/22 138/84   Pulse Readings from Last 3 Encounters:  08/15/23 94  05/12/23 100  05/23/22 91     Labs:?  Lab Results  Component Value Date   HGBA1C 8.5 (H) 08/15/2023   HGBA1C 7.5 (H) 05/12/2023   HGBA1C 10.2 (A) 05/23/2022   GLUCOSE 189 (H) 05/12/2023   MICRALBCREAT 12.0 05/12/2023  MICRALBCREAT 16.2 02/15/2022   MICRALBCREAT 8.3 10/05/2008   CREATININE 0.73 05/12/2023   CREATININE 0.88 12/13/2021   CREATININE 0.81 03/20/2021   GFRNONAA 68.49 01/02/2010   GFRNONAA 78.66 04/12/2009   GFRNONAA 91.90 11/15/2008    Lab Results  Component Value Date   CHOL 153 05/12/2023   LDLDIRECT 84.0 03/20/2021   HDL 49.10 05/12/2023   HDL 45.00 12/13/2021   HDL 39.00 (L) 03/20/2021    AST 20 05/12/2023   AST 21 12/13/2021   ALT 33 05/12/2023   ALT 25 12/13/2021      Chemistry      Component Value Date/Time   NA 138 05/12/2023 1451   K 4.6 05/12/2023 1451   CL 99 05/12/2023 1451   CO2 27 05/12/2023 1451   BUN 13 05/12/2023 1451   CREATININE 0.73 05/12/2023 1451      Component Value Date/Time   CALCIUM 10.2 05/12/2023 1451   ALKPHOS 77 05/12/2023 1451   AST 20 05/12/2023 1451   ALT 33 05/12/2023 1451   BILITOT 0.4 05/12/2023 1451     The 10-year ASCVD risk score (Arnett DK, et al., 2019) is: 26.2%  Assessment and Plan:   1. Diabetes, type 2: improved A1c of 8.5% (08/15/23), increased from previous 7.5 2/2 stopping all insulin . Recently re-established on basal with plan to start prandial today (both via [employer] PAP). Goal <7% without hypoglycemia, reasonable target <7.5%. FBG consistently upper 100s-200s. Sugar remains elevated throughout the day with largest and most consistent spike between 12pm-2pm daily. Proceed w previous plan, resume basaglar . Will review Libre next week with plan to add small Humalog  dose before afternoon meal only per peaks in the 400s.  Current Regimen: Trulicity  4.5 mg weekly, metformin  ER 500 mg BID, glimepiride  5 mg once daily, basalgar 10 units daily Increase basaglar  to 11 units once daily (10%??) x3-4 days, if FBG remain >140 w/o lows, increase to 12 units/day Start Humalog  3 units AC lunch once daily Reviewed signs/symptoms/treatment of hypoglycemia  Pen needle refill sent to pharmacy per patient request   Future Consideration: Humalog  reasonable with dinner meal if needed. Will see how sugars respond to initial lunch dose. Would also likely benefit from basal increase. Small increase today as she is going on vacation/out of town.  GLP1-RA: May consider transition to Ozempic per greaer A1c lowering propensity to reduce need for insulin /sulfonylurea SGLT2i: Avoid in the setting of recurrent yeast infections Metformin : Trial ER  metformin  to achieve goal dose of 1000 mg BID  SU: With optmization of other therapies, ideally discontinue glimepiride   TZD: Avoiding due to possible weight gain/fracture risk.  Follow Up Follow up with clinical pharmacist via phone next week - Humalog  start/Basaglar  titration Follow up with Donnie Galea, MD scheduled 1.5 months?  Future Appointments  Date Time Provider Department Center  09/03/2023  9:30 AM [facility] PHARMACIST [facility]  02/02/2024  8:50 AM [facility] ANNUAL WELLNESS VISIT 1 [facility]   Daron Ellen, PharmD Clinical Pharmacist [facility] [phone]

## 2023-08-24 ENCOUNTER — Ambulatory Visit: Payer: Self-pay | Admitting: Family Medicine

## 2023-08-24 ENCOUNTER — Other Ambulatory Visit: Payer: Self-pay | Admitting: Family Medicine

## 2023-08-24 MED ORDER — CYANOCOBALAMIN 500 MCG PO TABS: 500.0000 ug | ORAL_TABLET | Freq: Every day | ORAL | Status: AC

## 2023-09-03 ENCOUNTER — Other Ambulatory Visit (INDEPENDENT_AMBULATORY_CARE_PROVIDER_SITE_OTHER)

## 2023-09-03 ENCOUNTER — Other Ambulatory Visit (HOSPITAL_COMMUNITY): Payer: Self-pay

## 2023-09-03 ENCOUNTER — Telehealth: Payer: Self-pay

## 2023-09-03 DIAGNOSIS — E119 Type 2 diabetes mellitus without complications: Secondary | ICD-10-CM

## 2023-09-03 NOTE — Telephone Encounter (Signed)
 Pharmacy Patient Advocate Encounter  Insurance verification completed.   The patient is insured through [employer] claim for pen needles. Currently a quantity of 100 is a 30 day supply and the co-pay is $20.12 .   This test claim was processed through [facility]- copay amounts may vary at other pharmacies due to pharmacy/plan contracts, or as the patient moves through the different stages of their insurance plan.

## 2023-09-03 NOTE — Progress Notes (Signed)
 09/09/2023 Name: Deborah Alvarado MRN: [id number] DOB: [birth year]  Subjective  Chief Complaint  Patient presents with   Diabetes   Reason for visit: ?  Deborah Alvarado is a 73 y.o. female with a history of diabetes (type 2), who presents today for a diabetes pharmacotherapy visit.? Pertinent PMH also includes HTN, HLD, osteopenia.   They were referred to the pharmacist by their PCP for assistance in managing diabetes.   Care Team: Primary Care Provider: Cleatus Arlyss RAMAN, MD  Known DM Complications: no known complications    Action At Last Diabetes Related Visit: ?  5/9: No show. Left message.  4/23: Restart basaglar  7 u/d.  Metformin  IR ? ER. Hold off on starting Humalog  until next check in.  3/3: Taking MTF 1000mg  BID, d/w pt about trying 500mg  BID to limit GI upset. D/w pt about taking glimepiride  4mg  every day, not BID.   Medication Access/Adherence  Current Patient Assistance: Secretary/administrator (Basaglar , Humalog , Trulicity )  - Denies missed doses of Trulicity   Since Last visit / History of Present Illness: ?  Patient reports doing well today. Confirms compliance with DM medications. Has increased her basaglar  for hyperglycemia as instructed. No c/f lows.   Notes she needs pen needle refill. Her pharmacy is charging her >$60 for [facility]) so she did not pick up the most recent refill.   Reported DM Regimen: ?  Metformin  ER 500 mg twice daily  Trulicity  4.5 mg sq once weekly Glimepiride  4 mg daily with breakfast Basaglar  11 units daily (Last increase ~2 weeks ago) Humalog  3 units once daily (has increased to 5 units AC lunch)    DM medications tried in the past:?  Jardiance (yeast infection)  Reported Diet: Patient typically eats 2 meals per day.  Breakfast: skips, jello-o Lunch (12-1): Salad, cabbage/[employer]). If home, sandwich.  Dinner: Vegetables (green beans, onions, squash), chicken (grilled/baked) Snacks: Infrequent (jell-o) Beverages: Coffee  (CoffeeMate Vanilla creamer - regular sugar). Lemon water, Propel (sugar-free)  SMBG: ? Libre 3+ ([facility] cheapest)       Hypo/Hyperglycemia: ?  Symptoms of hypoglycemia since last visit:? no  Symptoms of Hyperglycemia: No   DM Prevention:  Statin: Taking; moderate intensity.?  History of chronic kidney disease? no History of albuminuria? none>30, last UACR on 05/12/23 = 12 mg/g ACE/ARB - Taking losartan  50 mg; Urine MA/CR Ratio - normal.  Last eye exam: Not documented Last foot exam: 05/12/2023 Tobacco Use: Never  Cardiovascular Risk Reduction History of clinical ASCVD? no The 10-year ASCVD risk score (Arnett DK, et al., 2019) is: 26.2% History of heart failure? no History of hyperlipidemia? yes Current BMI: 29.0 kg/m2 (Ht 62.28 in, Wt 72.6 kg) Taking statin? yes; moderate intensity (simvastatin  40) Taking aspirin? not indicated; Not taking   Taking SGLT-2i? no Taking GLP- 1 RA? yes      _______________________________________________  Objective     Vitals:  Wt Readings from Last 3 Encounters:  08/15/23 155 lb (70.3 kg)  05/12/23 160 lb (72.6 kg)  01/30/23 153 lb (69.4 kg)   BP Readings from Last 3 Encounters:  08/15/23 134/82  05/12/23 136/74  05/23/22 138/84   Pulse Readings from Last 3 Encounters:  08/15/23 94  05/12/23 100  05/23/22 91     Labs:?  Lab Results  Component Value Date   HGBA1C 8.5 (H) 08/15/2023   HGBA1C 7.5 (H) 05/12/2023   HGBA1C 10.2 (A) 05/23/2022   GLUCOSE 189 (H) 05/12/2023   MICRALBCREAT 12.0 05/12/2023  MICRALBCREAT 8.3 10/05/2008   MICRALBCREAT 15.0 07/16/2007   CREATININE 0.73 05/12/2023   CREATININE 0.88 12/13/2021   CREATININE 0.81 03/20/2021   GFRNONAA 68.49 01/02/2010   GFRNONAA 78.66 04/12/2009   GFRNONAA 91.90 11/15/2008    Lab Results  Component Value Date   CHOL 153 05/12/2023   LDLDIRECT 84.0 03/20/2021   HDL 49.10 05/12/2023   HDL 45.00 12/13/2021   HDL 39.00 (L) 03/20/2021   AST 20 05/12/2023   AST 21  12/13/2021   ALT 33 05/12/2023   ALT 25 12/13/2021      Chemistry      Component Value Date/Time   NA 138 05/12/2023 1451   K 4.6 05/12/2023 1451   CL 99 05/12/2023 1451   CO2 27 05/12/2023 1451   BUN 13 05/12/2023 1451   CREATININE 0.73 05/12/2023 1451      Component Value Date/Time   CALCIUM 10.2 05/12/2023 1451   ALKPHOS 77 05/12/2023 1451   AST 20 05/12/2023 1451   ALT 33 05/12/2023 1451   BILITOT 0.4 05/12/2023 1451     The 10-year ASCVD risk score (Arnett DK, et al., 2019) is: 26.2%  Assessment and Plan:   1. Diabetes, type 2: improved A1c of 8.5% (08/15/23), increased from previous 7.5 2/2 stopping all insulin . Recently re-established on basal with new prandial start in June. Goal <7% without hypoglycemia, reasonable target <7.5%. FBG remains consistently high-100s. Sugar remains elevated throughout the day with largest and most consistent peak still between 12pm-2pm daily indicating need for further prandial titration. Current Regimen: Trulicity  4.5 mg weekly, metformin  ER 500 mg BID, glimepiride  5 mg once daily, basalgar 11 units daily, Humalog  5 units AC lunch Increase basaglar  from 11 to 13 units once daily Increase Humalog  form 5 to 7 units AC lunch Reviewed signs/symptoms/treatment of hypoglycemia   Future Consideration: Humalog  reasonable with dinner meal if needed. Will see how sugars respond to lunch dose GLP1-RA: May consider transition to Ozempic per greaer A1c lowering propensity to reduce need for insulin /sulfonylurea SGLT2i: Avoid in the setting of recurrent yeast infections Metformin : Consider addition of 1 tablet per c/f ADE. Goal dose of 1000 mg BID  SU: With optmization of other therapies, ideally discontinue glimepiride   TZD: Avoiding due to possible weight gain/fracture risk.  Follow Up Follow up with clinical pharmacist via phone ~1 week based on Libre report  Future Appointments  Date Time Provider Department Center  02/02/2024  8:50 AM [facility]  ANNUAL WELLNESS VISIT 1 [facility]   Deborah Alvarado, PharmD Clinical Pharmacist [facility] [phone]

## 2023-09-11 ENCOUNTER — Encounter: Payer: Self-pay | Admitting: Pharmacist

## 2023-09-11 NOTE — Progress Notes (Signed)
 Patient Assistance Program (PAP) Application   Manufacturer: Novo Nordisk    (New enrollment) Medication(s): NovoFine Pen Needles  Patient Portion of Application:  7/3: Completed with patient via online enrollment tool. Submitted.   Provider Portion of Application:  Uploaded to eFax folder for review and signature Prescription(s): NovoFine Pen Needle (4 boxes = 400 ea)  Strangely, her insurance is not paying for pen needles. Will continue to work to understand this. [facility] and [facility] have confirmed denial of coverage through patient's insurance.

## 2023-09-18 ENCOUNTER — Encounter: Payer: Self-pay | Admitting: Pharmacist

## 2023-09-18 NOTE — Progress Notes (Signed)
 Paper work has been faxed

## 2023-09-18 NOTE — Progress Notes (Signed)
 Brief Telephone Documentation Reason for Call: Medication Access  Summary of Call: Test claim shows no coverage for insulin  pen needles Cost at pharmacy is high  All Medicare plans cover testing supplies. Likely just needs to be billed through part B instead of part D  Reviewed patient's Medicare benefit document: [email or web address]  Testing supplies = $0 under medical benefit     Now going through for $15 for 300 pen needles (prior patient was told cost was >$100) [facility] not in [facility].   Will attempt future refills through DME (medical benefit) to see if cost is reduced.  Parachute order started. Rx form uploaded to PCP eFax folder for sig/fax.  Fax to [phone]   Follow Up: Patient given direct line for further questions/concerns.  Manuelita FABIENE Kobs, PharmD Clinical Pharmacist [facility] [phone]

## 2023-09-22 ENCOUNTER — Telehealth: Payer: Self-pay | Admitting: Family Medicine

## 2023-09-22 NOTE — Telephone Encounter (Signed)
 Copied from CRM [id number]. Topic: General - Call Back - No Documentation >> Sep 22, 2023  1:07 PM Rea C wrote: Reason for CRM: Deborah Alvarado- asked if Manuelita may give a call back in regards to patients pen needles for diabetes. Call back number is [phone].

## 2023-09-23 ENCOUNTER — Encounter: Payer: Self-pay | Admitting: Pharmacist

## 2023-09-23 NOTE — Telephone Encounter (Signed)
 Communication  Reason for CRM: Patient called back to [facility] Nurse Manuelita know that her pen needles for diabetes are still good.

## 2023-09-23 NOTE — Progress Notes (Signed)
 Brief Telephone Documentation Reason for Call: Pen Needles   Summary of Call: Called patient to see if she has been able to receive [facility].  She has canceled DME order so I suspect they had issues with billing as well?  No answer. Left message with direct callback number.

## 2023-09-27 ENCOUNTER — Other Ambulatory Visit: Payer: Self-pay | Admitting: Family Medicine

## 2023-10-01 DIAGNOSIS — E119 Type 2 diabetes mellitus without complications: Secondary | ICD-10-CM | POA: Diagnosis not present

## 2023-10-17 ENCOUNTER — Other Ambulatory Visit (INDEPENDENT_AMBULATORY_CARE_PROVIDER_SITE_OTHER): Admitting: Pharmacist

## 2023-10-17 ENCOUNTER — Other Ambulatory Visit

## 2023-10-17 DIAGNOSIS — E119 Type 2 diabetes mellitus without complications: Secondary | ICD-10-CM

## 2023-10-17 NOTE — Progress Notes (Signed)
 10/17/2023 Name: [name] MRN: [id number] DOB: 1951/02/21  Subjective  Chief Complaint  Patient presents with   Diabetes   Reason for visit: ?  Deborah Alvarado is a 73 y.o. female with a history of diabetes (type 2), who presents today for a diabetes pharmacotherapy visit.? Pertinent PMH also includes HTN, HLD, osteopenia.   They were referred to the pharmacist by their PCP for assistance in managing diabetes.   Care Team: Primary Care Provider: Cleatus Arlyss RAMAN, MD  Known DM Complications: no known complications    Action At Last Diabetes Related Visit: ?  6/13: ??basaglar  11 units/d (10%) x3-4 days. For FBG >140, increase to 12 units/day. Start Huamlog 3 units AClunch 5/9: No show. Left message.  4/23: Restart basaglar  7 u/d.  Metformin  IR ? ER. Hold off on starting Humalog  until next check in.  3/3: Taking MTF 1000mg  BID, d/w pt about trying 500mg  BID to limit GI upset. D/w pt about taking glimepiride  4mg  every day, not BID.   Medication Access/Adherence  Current Patient Assistance: Secretary/administrator (Basaglar , Humalog , Trulicity )  Since Last visit / History of Present Illness: ?  Patient reports doing well today. Confirms compliance with DM medications. Has slowly increased her insulins for hyperglycemia. Now using 13-14 units basaglar  each morning. 6 units Humalog  before lunch only.    Reported DM Regimen: ?  Metformin  ER 500 mg twice daily  Trulicity  4.5 mg sq once weekly Glimepiride  4 mg daily with breakfast Basaglar  13-14 units daily (morning) Humalog  6 units once daily (lunch)   DM medications tried in the past:?  Jardiance (yeast infection)  Reported Diet: Patient typically eats 2 meals per day.  Reports she has started incorporating more healthy fats (1 avocado daily)  Breakfast: skips, jello-o Lunch (12-1): Salad, cabbage/[employer]). If home, sandwich.  Dinner: Vegetables (green beans, onions, squash), chicken (grilled/baked) Snacks: Infrequent  (jell-o) Beverages: Coffee (CoffeeMate Vanilla creamer - regular sugar). Lemon water, Propel (sugar-free)  Exercise: Has started going to the gym regularly with her husband. Works out for ~1 hour including a mix of exercise machines and cardio machines such as stationary bike.   SMBG: ? Libre 3+ ([facility] cheapest)   CGM Report (Date Range 7/26 - 8/8) ABG 225 mg/dL; GMI 1.2%; Variability 25.6%; TIR 25%, high 44%, very high 31%, low 0%, very low 0%    Hypo/Hyperglycemia: ?  Symptoms of hypoglycemia since last visit:? no   DM Prevention:  Statin: Taking; moderate intensity.?  History of chronic kidney disease? no History of albuminuria? none>30, last UACR on 05/12/23 = 12 mg/g ACE/ARB - Taking losartan  50 mg; Urine MA/CR Ratio - normal.  Last eye exam: Not documented Last foot exam: 05/12/2023 Tobacco Use: Never  Cardiovascular Risk Reduction History of clinical ASCVD? no The 10-year ASCVD risk score (Arnett DK, et al., 2019) is: 26.2% History of heart failure? no History of hyperlipidemia? yes Current BMI: 29.0 kg/m2 (Ht 62.28 in, Wt 72.6 kg) Taking statin? yes; moderate intensity (simvastatin  40) Taking aspirin? not indicated; Not taking   Taking SGLT-2i? no Taking GLP- 1 RA? yes      _______________________________________________  Objective     Vitals:  Wt Readings from Last 3 Encounters:  08/15/23 155 lb (70.3 kg)  05/12/23 160 lb (72.6 kg)  01/30/23 153 lb (69.4 kg)   BP Readings from Last 3 Encounters:  08/15/23 134/82  05/12/23 136/74  05/23/22 138/84   Pulse Readings from Last 3 Encounters:  08/15/23 94  05/12/23 100  05/23/22 91     Labs:?  Lab Results  Component Value Date   HGBA1C 8.5 (H) 08/15/2023   HGBA1C 7.5 (H) 05/12/2023   HGBA1C 10.2 (A) 05/23/2022   GLUCOSE 189 (H) 05/12/2023   MICRALBCREAT 12.0 05/12/2023   MICRALBCREAT 8.3 10/05/2008   MICRALBCREAT 15.0 07/16/2007   CREATININE 0.73 05/12/2023   CREATININE 0.88 12/13/2021    CREATININE 0.81 03/20/2021   GFRNONAA 68.49 01/02/2010   GFRNONAA 78.66 04/12/2009   GFRNONAA 91.90 11/15/2008    Lab Results  Component Value Date   CHOL 153 05/12/2023   LDLDIRECT 84.0 03/20/2021   HDL 49.10 05/12/2023   HDL 45.00 12/13/2021   HDL 39.00 (L) 03/20/2021   AST 20 05/12/2023   AST 21 12/13/2021   ALT 33 05/12/2023   ALT 25 12/13/2021      Chemistry      Component Value Date/Time   NA 138 05/12/2023 1451   K 4.6 05/12/2023 1451   CL 99 05/12/2023 1451   CO2 27 05/12/2023 1451   BUN 13 05/12/2023 1451   CREATININE 0.73 05/12/2023 1451      Component Value Date/Time   CALCIUM 10.2 05/12/2023 1451   ALKPHOS 77 05/12/2023 1451   AST 20 05/12/2023 1451   ALT 33 05/12/2023 1451   BILITOT 0.4 05/12/2023 1451     The 10-year ASCVD risk score (Arnett DK, et al., 2019) is: 26.2%  Assessment and Plan:   1. Diabetes, type 2: uncontrolled A1c of 8.5% (08/15/23), increased from previous 7.5% 2/2 stopping all insulin . Recently re-established on basal several months ago with new prandial start in June. Goal <7% without hypoglycemia, reasonable target <7.5%.  On Libre report, sugar remains elevated through majority of the day. Fasting consistently above goal. Hyperglycemia concentrated to afternoon meal though severity of BG spike in inconsistent, related to diet. Patient notes she is learning from this which meals/foods to limit/stay away from.  On days with healthier choices, Humalog  dose sufficiently lowers PPBG. Avoid titration at this point as to not precipitate lows, focus on maintaining good lunch choices. Established goal of balancing meals (e.g. if having only salad+ dressing, add some protein. Or sugar-free greek yogurt as a post-gym snack). Current Regimen: Trulicity  4.5 mg weekly, metformin  ER 500 mg BID, glimepiride  5 mg once daily, basalgar 13-14 units daily, Humalog  6 units AC lunch Increase basaglar  to 15 units once daily Continue Humalog  6u once daily before  lunch. Reviewed signs/symptoms/treatment of hypoglycemia   Future Consideration: GLP1-RA: Coulld consider transition to Ozempic per greaer A1c lowering propensity to reduce need for insulin /sulfonylurea though weight loss not a goal at this time.  SGLT2i: Avoid in the setting of recurrent yeast infections Metformin : Consider addition of 1 tablet per c/f ADE. Goal dose of 1000 mg BID  SU: With optmization of other therapies, ideally discontinue glimepiride . No current c/f low sugars. TZD: Avoiding due to possible weight gain/fracture risk.   Follow Up Reminder set for CGM review <1 week   Future Appointments  Date Time Provider Department Center  02/02/2024  8:50 AM [facility] ANNUAL WELLNESS VISIT 1 [facility]   Manuelita FABIENE Kobs, PharmD Clinical Pharmacist [facility] [phone]

## 2023-10-22 ENCOUNTER — Other Ambulatory Visit (INDEPENDENT_AMBULATORY_CARE_PROVIDER_SITE_OTHER): Payer: Self-pay | Admitting: Pharmacist

## 2023-10-22 DIAGNOSIS — E119 Type 2 diabetes mellitus without complications: Secondary | ICD-10-CM

## 2023-10-22 NOTE — Progress Notes (Signed)
 Brief Telephone Documentation Reason for Call: Insulin  monitoring   Summary of Call: Called patient 10/22/23. BG remain elevated though improving s/p basal titration on Friday last week. No c/f lows.  Plan to continue current doses without changes today. Patient without concerns/questions.    Follow Up: Patient given direct line for further questions/concerns. Libre review ~1 week  Manuelita FABIENE Kobs, PharmD Clinical Pharmacist [facility] [phone]

## 2023-11-25 ENCOUNTER — Other Ambulatory Visit (INDEPENDENT_AMBULATORY_CARE_PROVIDER_SITE_OTHER): Payer: Self-pay | Admitting: Pharmacist

## 2023-11-25 DIAGNOSIS — E119 Type 2 diabetes mellitus without complications: Secondary | ICD-10-CM

## 2023-11-25 NOTE — Progress Notes (Signed)
 11/25/2023 Name: Deborah Alvarado MRN: [id number] DOB: 1950-06-02  Subjective  No chief complaint on file.  Reason for visit: ?  Deborah Alvarado is a 73 y.o. female with a history of diabetes (type 2), who presents today for a diabetes pharmacotherapy visit.? Pertinent PMH also includes HTN, HLD, osteopenia.   They were referred to the pharmacist by their PCP for assistance in managing diabetes.   Care Team: Primary Care Provider: Cleatus Arlyss RAMAN, MD  Known DM Complications: no known complications   Medication Access/Adherence  Current Patient Assistance: Lilly Cares (Basaglar , Humalog , Trulicity )  Since Last visit / History of Present Illness: ?  Patient reports doing well today. Notes she is using 14 units basaglar  each morning though has stopped using Humalog  over the past couple of weeks due to nighttime/morning sugars being at goal.   Reported DM Regimen: ?  Metformin  ER 500 mg twice daily  Trulicity  4.5 mg sq once weekly Glimepiride  4 mg daily with breakfast Basaglar  14 units daily (morning)  (lunch) - stopped using   DM medications tried in the past:?  Jardiance (yeast infection)  Reported Diet: Patient typically eats 2 meals per day.  Reports she has started incorporating more healthy fats (1 avocado daily)  Breakfast: skips, jello-o Lunch (12-1): Out to eat every day of the week. - Salad, cabbage/[employer]). If home, sandwich.  Dinner: Vegetables (green beans, onions, squash), chicken (grilled/baked) Snacks: Infrequent (jell-o) Beverages: Coffee (CoffeeMate Vanilla creamer - regular sugar). Lemon water, Propel (sugar-free)  Exercise: Has continued going to the gym regularly with her husband. Works out for ~1 hour including a mix of exercise machines and cardio machines such as stationary bike.   SMBG: ? Libre 3+ ([employer] cheapest)   CGM Report (Date Range 9/3 - 9/16) ABG 192 mg/dL; GMI 2.0%; Variability 31.1%; TIR 49%, high 35%, very high 16%,  low 0%, very low 0%     Hypo/Hyperglycemia: ?  Symptoms of hypoglycemia since last visit:? no   DM Prevention:  Statin: Taking; moderate intensity.?  History of chronic kidney disease? no History of albuminuria? none>30, last UACR on 05/12/23 = 12 mg/g ACE/ARB - Taking losartan  50 mg; Urine MA/CR Ratio - normal.  Last eye exam: Not documented Last foot exam: 05/12/2023 Tobacco Use: Never  Cardiovascular Risk Reduction History of clinical ASCVD? no The 10-year ASCVD risk score (Arnett DK, et al., 2019) is: 26.2% History of heart failure? no History of hyperlipidemia? yes Current BMI: 29.0 kg/m2 (Ht 62.28 in, Wt 72.6 kg) Taking statin? yes; moderate intensity (simvastatin  40) Taking aspirin? not indicated; Not taking   Taking SGLT-2i? no Taking GLP- 1 RA? yes      _______________________________________________  Objective     Vitals:  Wt Readings from Last 3 Encounters:  08/15/23 155 lb (70.3 kg)  05/12/23 160 lb (72.6 kg)  01/30/23 153 lb (69.4 kg)   BP Readings from Last 3 Encounters:  08/15/23 134/82  05/12/23 136/74  05/23/22 138/84   Pulse Readings from Last 3 Encounters:  08/15/23 94  05/12/23 100  05/23/22 91     Labs:?  Lab Results  Component Value Date   HGBA1C 8.5 (H) 08/15/2023   HGBA1C 7.5 (H) 05/12/2023   HGBA1C 10.2 (A) 05/23/2022   GLUCOSE 189 (H) 05/12/2023   MICRALBCREAT 12.0 05/12/2023   MICRALBCREAT 8.3 10/05/2008   MICRALBCREAT 15.0 07/16/2007   CREATININE 0.73 05/12/2023   CREATININE 0.88 12/13/2021   CREATININE 0.81 03/20/2021   GFRNONAA 68.49 01/02/2010  GFRNONAA 78.66 04/12/2009   GFRNONAA 91.90 11/15/2008    Lab Results  Component Value Date   CHOL 153 05/12/2023   LDLDIRECT 84.0 03/20/2021   HDL 49.10 05/12/2023   HDL 45.00 12/13/2021   HDL 39.00 (L) 03/20/2021   AST 20 05/12/2023   AST 21 12/13/2021   ALT 33 05/12/2023   ALT 25 12/13/2021      Chemistry      Component Value Date/Time   NA 138 05/12/2023 1451    K 4.6 05/12/2023 1451   CL 99 05/12/2023 1451   CO2 27 05/12/2023 1451   BUN 13 05/12/2023 1451   CREATININE 0.73 05/12/2023 1451      Component Value Date/Time   CALCIUM 10.2 05/12/2023 1451   ALKPHOS 77 05/12/2023 1451   AST 20 05/12/2023 1451   ALT 33 05/12/2023 1451   BILITOT 0.4 05/12/2023 1451     The 10-year ASCVD risk score (Arnett DK, et al., 2019) is: 26.2%  Assessment and Plan:   1. Diabetes, type 2: uncontrolled A1c of 8.5% (08/15/23). New prandial start in June. Goal <7% without hypoglycemia, reasonable target <7.5%. GMI today 7.9%, slightly improved from previous.  On Libre report, fasting sugars are significantly improved. Remain at goal most days of the week without any c/f lows. Hyperglycemia remains concentrated to afternoon meal 2/2 eating out daily. Unfortunately, patient stopped Humalog  because she saw morning sugars improving. PPBG often 2hPPBG often high 200s-mid300s.  Current Regimen: Trulicity  4.5 mg weekly, metformin  ER 500 mg BID, glimepiride  5 mg once daily, basalgar 14 units daily, Humalog  6 units AC lunch Resume Humalog  prior to lunch meal Continue basaglar  14 units daily  Reviewed BG goals related to insulin  types (e.g. FBG/basal vs. 2hPPBG/prandial) Reviewed signs/symptoms/treatment of hypoglycemia   Future Consideration: GLP1-RA: Coulld consider transition to Ozempic per greaer A1c lowering propensity to reduce need for insulin /sulfonylurea though weight loss not a goal at this time.  SGLT2i: Avoid in the setting of recurrent yeast infections Metformin : Consider addition of 1 tablet per c/f ADE. Goal dose of 1000 mg BID  SU: With optmization of other therapies, ideally discontinue glimepiride . No current c/f low sugars. TZD: Avoiding due to possible weight gain/fracture risk.   Follow Up PharmD CGM review 3 weeks  Future Appointments  Date Time Provider Department Center  02/02/2024  8:50 AM [facility] ANNUAL WELLNESS VISIT 1 [facility] [address]    Manuelita FABIENE Kobs, PharmD Clinical Pharmacist [facility] [phone]

## 2023-11-26 DIAGNOSIS — E119 Type 2 diabetes mellitus without complications: Secondary | ICD-10-CM | POA: Diagnosis not present

## 2023-11-26 DIAGNOSIS — H2513 Age-related nuclear cataract, bilateral: Secondary | ICD-10-CM | POA: Diagnosis not present

## 2023-11-26 DIAGNOSIS — H40003 Preglaucoma, unspecified, bilateral: Secondary | ICD-10-CM | POA: Diagnosis not present

## 2023-11-26 DIAGNOSIS — H25013 Cortical age-related cataract, bilateral: Secondary | ICD-10-CM | POA: Diagnosis not present

## 2023-11-26 LAB — HM DIABETES EYE EXAM

## 2023-12-03 DIAGNOSIS — H25011 Cortical age-related cataract, right eye: Secondary | ICD-10-CM | POA: Diagnosis not present

## 2023-12-03 DIAGNOSIS — H2511 Age-related nuclear cataract, right eye: Secondary | ICD-10-CM | POA: Diagnosis not present

## 2023-12-03 DIAGNOSIS — H25012 Cortical age-related cataract, left eye: Secondary | ICD-10-CM | POA: Diagnosis not present

## 2023-12-03 DIAGNOSIS — H2513 Age-related nuclear cataract, bilateral: Secondary | ICD-10-CM | POA: Diagnosis not present

## 2023-12-03 DIAGNOSIS — H2512 Age-related nuclear cataract, left eye: Secondary | ICD-10-CM | POA: Diagnosis not present

## 2023-12-03 DIAGNOSIS — H40003 Preglaucoma, unspecified, bilateral: Secondary | ICD-10-CM | POA: Diagnosis not present

## 2023-12-09 ENCOUNTER — Encounter: Payer: Self-pay | Admitting: Ophthalmology

## 2023-12-10 NOTE — Anesthesia Preprocedure Evaluation (Addendum)
 Anesthesia Evaluation  Patient identified by MRN, date of birth, ID band Patient awake    Reviewed: Allergy & Precautions, H&P , NPO status , Patient's Chart, lab work & pertinent test results  Airway Mallampati: III  TM Distance: >3 FB Neck ROM: Full    Dental no notable dental hx.    Pulmonary neg pulmonary ROS   Pulmonary exam normal breath sounds clear to auscultation       Cardiovascular hypertension, Normal cardiovascular exam Rhythm:Regular Rate:Normal     Neuro/Psych   Anxiety     negative neurological ROS  negative psych ROS   GI/Hepatic negative GI ROS, Neg liver ROS,GERD  ,,  Endo/Other  diabetes    Renal/GU negative Renal ROS  negative genitourinary   Musculoskeletal negative musculoskeletal ROS (+) Arthritis ,    Abdominal   Peds negative pediatric ROS (+)  Hematology negative hematology ROS (+)   Anesthesia Other Findings Allergy  Anxiety Diabetes mellitus  Hypertension Hyperlipidemia  NSVD (normal spontaneous vaginal delivery) Arthritis  GERD (gastroesophageal reflux disease Late entry: became nauseated intra-op for cataract surgery, received zofran   Reproductive/Obstetrics negative OB ROS                              Anesthesia Physical Anesthesia Plan  ASA: 2  Anesthesia Plan: General   Post-op Pain Management:    Induction: Intravenous  PONV Risk Score and Plan:   Airway Management Planned: Natural Airway and Nasal Cannula  Additional Equipment:   Intra-op Plan:   Post-operative Plan:   Informed Consent: I have reviewed the patients History and Physical, chart, labs and discussed the procedure including the risks, benefits and alternatives for the proposed anesthesia with the patient or authorized representative who has indicated his/her understanding and acceptance.     Dental Advisory Given  Plan Discussed with: Anesthesiologist, CRNA and  Surgeon  Anesthesia Plan Comments: (Patient consented for risks of anesthesia including but not limited to:  - adverse reactions to medications - risk of airway placement if required - damage to eyes, teeth, lips or other oral mucosa - nerve damage due to positioning  - sore throat or hoarseness - Damage to heart, brain, nerves, lungs, other parts of body or loss of life  Patient voiced understanding and assent.)         Anesthesia Quick Evaluation

## 2023-12-11 NOTE — Discharge Instructions (Signed)

## 2023-12-15 ENCOUNTER — Encounter: Payer: Self-pay | Admitting: Ophthalmology

## 2023-12-15 ENCOUNTER — Ambulatory Visit: Payer: Self-pay | Admitting: Anesthesiology

## 2023-12-15 ENCOUNTER — Encounter
Admission: RE | Disposition: A | Discharge status: Home / Self Care | Payer: Self-pay | Source: Home / Self Care | Attending: Ophthalmology

## 2023-12-15 ENCOUNTER — Ambulatory Visit
Admission: RE | Admit: 2023-12-15 | Discharge: 2023-12-15 | Disposition: A | Discharge status: Home / Self Care | Attending: Ophthalmology | Admitting: Ophthalmology

## 2023-12-15 ENCOUNTER — Other Ambulatory Visit: Payer: Self-pay

## 2023-12-15 DIAGNOSIS — H2512 Age-related nuclear cataract, left eye: Secondary | ICD-10-CM | POA: Insufficient documentation

## 2023-12-15 DIAGNOSIS — E785 Hyperlipidemia, unspecified: Secondary | ICD-10-CM | POA: Diagnosis not present

## 2023-12-15 DIAGNOSIS — Z794 Long term (current) use of insulin: Secondary | ICD-10-CM | POA: Insufficient documentation

## 2023-12-15 DIAGNOSIS — K219 Gastro-esophageal reflux disease without esophagitis: Secondary | ICD-10-CM | POA: Diagnosis not present

## 2023-12-15 DIAGNOSIS — I10 Essential (primary) hypertension: Secondary | ICD-10-CM | POA: Diagnosis not present

## 2023-12-15 DIAGNOSIS — E1136 Type 2 diabetes mellitus with diabetic cataract: Secondary | ICD-10-CM | POA: Insufficient documentation

## 2023-12-15 DIAGNOSIS — Z7984 Long term (current) use of oral hypoglycemic drugs: Secondary | ICD-10-CM | POA: Diagnosis not present

## 2023-12-15 DIAGNOSIS — H25012 Cortical age-related cataract, left eye: Secondary | ICD-10-CM | POA: Diagnosis not present

## 2023-12-15 HISTORY — PX: CATARACT EXTRACTION W/PHACO: SHX586

## 2023-12-15 HISTORY — DX: Gastro-esophageal reflux disease without esophagitis: K21.9

## 2023-12-15 HISTORY — DX: Unspecified osteoarthritis, unspecified site: M19.90

## 2023-12-15 HISTORY — DX: Nausea with vomiting, unspecified: R11.2

## 2023-12-15 LAB — GLUCOSE, CAPILLARY: Glucose-Capillary: 152 mg/dL — ABNORMAL HIGH (ref 70–99)

## 2023-12-15 SURGERY — PHACOEMULSIFICATION, CATARACT, WITH IOL INSERTION
Anesthesia: General | Site: Eye | Laterality: Left

## 2023-12-15 MED ORDER — MIDAZOLAM HCL 2 MG/2ML IJ SOLN
INTRAMUSCULAR | Status: DC | PRN
  Administered 2023-12-15: 2 mg via INTRAVENOUS

## 2023-12-15 MED ORDER — AMISULPRIDE (ANTIEMETIC) 5 MG/2ML IV SOLN
INTRAVENOUS | Status: AC
  Filled 2023-12-15: qty 2

## 2023-12-15 MED ORDER — ARMC OPHTHALMIC DILATING DROPS
OPHTHALMIC | Status: AC
  Filled 2023-12-15: qty 0.5

## 2023-12-15 MED ORDER — SIGHTPATH DOSE#1 BSS IO SOLN
INTRAOCULAR | Status: DC | PRN
  Administered 2023-12-15: 85 mL via OPHTHALMIC

## 2023-12-15 MED ORDER — MOXIFLOXACIN HCL 0.5 % OP SOLN
OPHTHALMIC | Status: DC | PRN
  Administered 2023-12-15: .2 mL via OPHTHALMIC

## 2023-12-15 MED ORDER — FENTANYL CITRATE (PF) 100 MCG/2ML IJ SOLN
INTRAMUSCULAR | Status: AC
  Filled 2023-12-15: qty 2

## 2023-12-15 MED ORDER — AMISULPRIDE (ANTIEMETIC) 5 MG/2ML IV SOLN
10.0000 mg | Freq: Once | INTRAVENOUS | Status: AC
  Administered 2023-12-15: 10 mg via INTRAVENOUS

## 2023-12-15 MED ORDER — ARMC OPHTHALMIC DILATING DROPS
1.0000 | OPHTHALMIC | Status: DC | PRN
  Administered 2023-12-15 (×3): 1 via OPHTHALMIC

## 2023-12-15 MED ORDER — LIDOCAINE HCL (PF) 2 % IJ SOLN
INTRAOCULAR | Status: DC | PRN
  Administered 2023-12-15: 4 mL via INTRAOCULAR

## 2023-12-15 MED ORDER — ONDANSETRON HCL 4 MG/2ML IJ SOLN
INTRAMUSCULAR | Status: DC | PRN
  Administered 2023-12-15: 4 mg via INTRAVENOUS

## 2023-12-15 MED ORDER — FENTANYL CITRATE (PF) 100 MCG/2ML IJ SOLN
INTRAMUSCULAR | Status: DC | PRN
  Administered 2023-12-15 (×2): 50 ug via INTRAVENOUS

## 2023-12-15 MED ORDER — TETRACAINE HCL 0.5 % OP SOLN
1.0000 [drp] | OPHTHALMIC | Status: DC | PRN
  Administered 2023-12-15 (×3): 1 [drp] via OPHTHALMIC

## 2023-12-15 MED ORDER — LACTATED RINGERS IV SOLN: INTRAVENOUS | Status: DC

## 2023-12-15 MED ORDER — TETRACAINE HCL 0.5 % OP SOLN
OPHTHALMIC | Status: AC
  Filled 2023-12-15: qty 4

## 2023-12-15 MED ORDER — SIGHTPATH DOSE#1 BSS IO SOLN
INTRAOCULAR | Status: DC | PRN
  Administered 2023-12-15: 15 mL via INTRAOCULAR

## 2023-12-15 MED ORDER — MIDAZOLAM HCL 2 MG/2ML IJ SOLN
INTRAMUSCULAR | Status: AC
  Filled 2023-12-15: qty 2

## 2023-12-15 MED ORDER — SIGHTPATH DOSE#1 NA HYALUR & NA CHOND-NA HYALUR IO KIT
PACK | INTRAOCULAR | Status: DC | PRN
  Administered 2023-12-15: 1 via OPHTHALMIC

## 2023-12-15 SURGICAL SUPPLY — 9 items
DISSECTOR HYDRO NUCLEUS 50X22 (MISCELLANEOUS) ×1 IMPLANT
FEE CATARACT SUITE SIGHTPATH (MISCELLANEOUS) ×1 IMPLANT
GLOVE PI ULTRA LF STRL 7.5 (GLOVE) ×1 IMPLANT
GLOVE SURG SYN 6.5 PF PI BL (GLOVE) ×1 IMPLANT
GLOVE SURG SYN 8.5 PF PI BL (GLOVE) ×1 IMPLANT
LENS IOL PANO PRO TORC 22.5 IMPLANT
NDL FILTER BLUNT 18X1 1/2 (NEEDLE) ×1 IMPLANT
NEEDLE FILTER BLUNT 18X1 1/2 (NEEDLE) ×1 IMPLANT
SYR 3ML LL SCALE MARK (SYRINGE) ×1 IMPLANT

## 2023-12-15 NOTE — Op Note (Signed)
 OPERATIVE NOTE  Deborah Alvarado [id number] 12/15/2023   PREOPERATIVE DIAGNOSIS:  Nuclear sclerotic cataract left eye.  H25.12   POSTOPERATIVE DIAGNOSIS:    Nuclear sclerotic cataract left eye.     PROCEDURE:  Phacoemusification with posterior chamber intraocular lens placement of the left eye   LENS:   Implant Name Type Inv. Item Serial No. Manufacturer Lot No. LRB No. Used Action  LENS IOL PANO PRO TORC 22.5 - [id number]  LENS IOL PANO PRO TORC 22.5 [id number] [employer]  Left 1 Implanted      Procedure(s): PHACOEMULSIFICATION, CATARACT, WITH IOL  4.79 00:38.8 (Left)  SURGEON:  Adine Novak, MD, MPH   ANESTHESIA:  Topical with tetracaine drops augmented with 1% preservative-free intracameral lidocaine.  ESTIMATED BLOOD LOSS: <1 mL   COMPLICATIONS:  None.   DESCRIPTION OF PROCEDURE:  The patient was identified in the holding room and transported to the operating room and placed in the supine position under the operating microscope.  The left eye was identified as the operative eye and it was prepped and draped in the usual sterile ophthalmic fashion.  The verion system was registered without difficulty.   A 1.0 millimeter clear-corneal paracentesis was made at the 5:00 position. 0.5 ml of preservative-free 1% lidocaine with epinephrine was injected into the anterior chamber.  The anterior chamber was filled with viscoelastic.  A 2.4 millimeter keratome was used to make a near-clear corneal incision at the 2:00 position.  A curvilinear capsulorrhexis was made with a cystotome and capsulorrhexis forceps.  Balanced salt solution was used to hydrodissect and hydrodelineate the nucleus.   Phacoemulsification was then used in stop and chop fashion to remove the lens nucleus and epinucleus.  The remaining cortex was then removed using the irrigation and aspiration handpiece. Viscoelastic was then placed into the capsular bag to distend it for lens placement.  A lens was then injected into  the capsular bag.  The remaining viscoelastic was aspirated.  The lens was rotated with guidance from the verion system.   Wounds were hydrated with balanced salt solution.  The anterior chamber was inflated to a physiologic pressure with balanced salt solution.  Intracameral vigamox 0.1 mL undiltued was injected into the eye and a drop placed onto the ocular surface.  No wound leaks were noted.  The patient was taken to the recovery room in stable condition without complications of anesthesia or surgery  Adine Novak 12/15/2023, 8:21 AM

## 2023-12-15 NOTE — H&P (Signed)
 [facility]   Primary Care Physician:  Cleatus Arlyss RAMAN, MD Ophthalmologist: Dr. Adine Novak  Pre-Procedure History & Physical: HPI:  Deborah Alvarado is a 73 y.o. female here for cataract surgery.   Past Medical History:  Diagnosis Date   Allergy    Anxiety    Arthritis    Diabetes mellitus    Type II   GERD (gastroesophageal reflux disease)    Hyperlipidemia    Hypertension    NSVD (normal spontaneous vaginal delivery)    x 2    Past Surgical History:  Procedure Laterality Date   TOTAL ABDOMINAL HYSTERECTOMY  01/2005   due to fibroids    Prior to Admission medications   Medication Sig Start Date End Date Taking? Authorizing Provider  Cholecalciferol (VITAMIN D ) 50 MCG (2000 UT) tablet Take 1 tablet (2,000 Units total) by mouth daily. 04/15/18  Yes Cleatus Arlyss RAMAN, MD  cyanocobalamin  (VITAMIN B12) 500 MCG tablet Take 1 tablet (500 mcg total) by mouth daily. 08/24/23  Yes Cleatus Arlyss RAMAN, MD  Dulaglutide  (TRULICITY ) 4.5 MG/0.5ML SOAJ Inject 4.5 mg sq once weekly 03/27/23  Yes Cleatus Arlyss RAMAN, MD  famotidine  (PEPCID ) 20 MG tablet Take 1 tablet (20 mg total) by mouth daily as needed for heartburn or indigestion. 05/12/23  Yes Cleatus Arlyss RAMAN, MD  glimepiride  (AMARYL ) 4 MG tablet Take 1 tablet (4 mg total) by mouth daily with breakfast. 05/12/23  Yes Cleatus Arlyss RAMAN, MD  Insulin  Glargine (BASAGLAR  [facility]) 100 UNIT/ML Inject 10 units sq once daily. Increase only as instructed. MDD 15 unit Patient taking differently: 14 Units. Inject 10 units sq once daily. Increase only as instructed. MDD 15 unit 08/15/23  Yes Cleatus Arlyss RAMAN, MD  insulin  lispro (HUMALOG  KWIKPEN) 100 UNIT/ML KwikPen Inject 0-5 units TIDAC only as instructed. MDD 15 unit. Patient taking differently: 6 Units once. Inject 0-5 units TIDAC only as instructed. MDD 15 unit. 07/02/23  Yes Cleatus Arlyss RAMAN, MD  losartan  (COZAAR ) 50 MG tablet TAKE 1 TABLET BY MOUTH DAILY 07/02/23  Yes Cleatus Arlyss RAMAN, MD  metFORMIN   (GLUCOPHAGE -XR) 500 MG 24 hr tablet TAKE 1 TABLET BY MOUTH 2 TIMES DAILY WITH A MEAL. 09/29/23  Yes Cleatus Arlyss RAMAN, MD  simvastatin  (ZOCOR ) 40 MG tablet TAKE 1 TABLET BY MOUTH AT  BEDTIME 07/02/23  Yes Cleatus Arlyss RAMAN, MD  Continuous Glucose Sensor (FREESTYLE LIBRE 3 PLUS SENSOR) MISC Change sensor every 15 days. 01/28/23   Cleatus Arlyss RAMAN, MD  Insulin  Pen Needle (PEN NEEDLES) 31G X 5 MM MISC Use to inject insulin  up to 3 times daily as instructed 08/22/23   Cleatus Arlyss RAMAN, MD    Allergies as of 12/01/2023 - Review Complete 08/15/2023  Allergen Reaction Noted   Lisinopril  07/13/2010   Metformin  and related Other (See Comments) 08/02/2014    Family History  Problem Relation Age of Onset   Cancer Mother        Leukemia   Diabetes Brother    Breast cancer Maternal Grandmother    Colon cancer Neg Hx     Social History   Socioeconomic History   Marital status: Married    Spouse name: Not on file   Number of children: 2   Years of education: Not on file   Highest education level: Not on file  Occupational History    Employer: [employer]  Tobacco Use   Smoking status: Never   Smokeless tobacco: Never  Substance and Sexual Activity  Alcohol use: No    Alcohol/week: 0.0 standard drinks of alcohol   Drug use: No   Sexual activity: Not on file  Other Topics Concern   Not on file  Social History Narrative   Grandchild died 05-15-2023   Worked at [employer], retired 2019   Social Drivers of [employer]: Low Risk  (01/30/2023)   Overall Financial Resource Strain (CARDIA)    Difficulty of Paying Living Expenses: Not hard at all  Food Insecurity: No Food Insecurity (01/30/2023)   Hunger Vital Sign    Worried About Running Out of Food in the Last Year: Never true    Ran Out of Food in the Last Year: Never true  Transportation Needs: No Transportation Needs (01/30/2023)   PRAPARE - Administrator, Civil Service (Medical): No    Lack of  Transportation (Non-Medical): No  Physical Activity: Insufficiently Active (01/30/2023)   Exercise Vital Sign    Days of Exercise per Week: 3 days    Minutes of Exercise per Session: 30 min  Stress: No Stress Concern Present (01/30/2023)   [employer] of Occupational Health - Occupational Stress Questionnaire    Feeling of Stress : Not at all  Social Connections: Moderately Isolated (01/30/2023)   Social Connection and Isolation Panel    Frequency of Communication with Friends and Family: More than three times a week    Frequency of Social Gatherings with Friends and Family: More than three times a week    Attends Religious Services: Never    Database administrator or Organizations: No    Attends Banker Meetings: Never    Marital Status: Married  Catering manager Violence: Not At Risk (01/30/2023)   Humiliation, Afraid, Rape, and Kick questionnaire    Fear of Current or Ex-Partner: No    Emotionally Abused: No    Physically Abused: No    Sexually Abused: No    Review of Systems: See HPI, otherwise negative ROS  Physical Exam: BP (!) 169/94   Pulse 89   Temp (!) 97.3 F (36.3 C) (Temporal)   Resp 13   Ht 5' 4 (1.626 m)   Wt 71.7 kg   SpO2 98%   BMI 27.12 kg/m  General:   Alert, cooperative. Head:  Normocephalic and atraumatic. Respiratory:  Normal work of breathing. Cardiovascular:  NAD  Impression/Plan: Deborah Alvarado is here for cataract surgery.  Risks, benefits, limitations, and alternatives regarding cataract surgery have been reviewed with the patient.  Questions have been answered.  All parties agreeable.   Adine Novak, MD  12/15/2023, 7:51 AM

## 2023-12-15 NOTE — Transfer of Care (Signed)
 Immediate Anesthesia Transfer of Care Note  Patient: Deborah Alvarado  Procedure(s) Performed: PHACOEMULSIFICATION, CATARACT, WITH IOL  4.79 00:38.8 (Left: Eye)  Patient Location: PACU  Anesthesia Type: General  Level of Consciousness: awake, alert  and patient cooperative  Airway and Oxygen Therapy: Patient Spontanous Breathing and Patient connected to supplemental oxygen  Post-op Assessment: Post-op Vital signs reviewed, Patient's Cardiovascular Status Stable, Respiratory Function Stable, Patent Airway and No signs of Nausea or vomiting  Post-op Vital Signs: Reviewed and stable  Complications: No notable events documented.

## 2023-12-15 NOTE — Anesthesia Postprocedure Evaluation (Signed)
 Anesthesia Post Note  Patient: Deborah Alvarado  Procedure(s) Performed: PHACOEMULSIFICATION, CATARACT, WITH IOL  4.79 00:38.8 (Left: Eye)  Patient location during evaluation: PACU Anesthesia Type: General Level of consciousness: awake and alert Pain management: pain level controlled Vital Signs Assessment: post-procedure vital signs reviewed and stable Respiratory status: spontaneous breathing, nonlabored ventilation, respiratory function stable and patient connected to nasal cannula oxygen Cardiovascular status: stable and blood pressure returned to baseline Postop Assessment: no apparent nausea or vomiting Anesthetic complications: no   No notable events documented.   Last Vitals:  Vitals:   12/15/23 0855 12/15/23 0900  BP:  138/72  Pulse: 88 86  Resp: 13 12  Temp:    SpO2: 91% 92%    Last Pain:  Vitals:   12/15/23 0900  TempSrc:   PainSc: 0-No pain                 Alyha Marines C Nabria Nevin

## 2023-12-16 ENCOUNTER — Encounter: Payer: Self-pay | Admitting: Ophthalmology

## 2023-12-16 DIAGNOSIS — H2511 Age-related nuclear cataract, right eye: Secondary | ICD-10-CM | POA: Diagnosis not present

## 2023-12-24 NOTE — Anesthesia Preprocedure Evaluation (Addendum)
 Anesthesia Evaluation  Patient identified by MRN, date of birth, ID band Patient awake    Reviewed: Allergy & Precautions, H&P , NPO status , Patient's Chart, lab work & pertinent test results  History of Anesthesia Complications (+) PONV and history of anesthetic complications  Airway Mallampati: III  TM Distance: >3 FB Neck ROM: Full    Dental no notable dental hx.    Pulmonary neg pulmonary ROS   Pulmonary exam normal breath sounds clear to auscultation       Cardiovascular hypertension, Normal cardiovascular exam Rhythm:Regular Rate:Normal     Neuro/Psych   Anxiety     negative neurological ROS  negative psych ROS   GI/Hepatic negative GI ROS, Neg liver ROS,GERD  ,,  Endo/Other  diabetes    Renal/GU negative Renal ROS  negative genitourinary   Musculoskeletal negative musculoskeletal ROS (+) Arthritis ,    Abdominal   Peds negative pediatric ROS (+)  Hematology negative hematology ROS (+)   Anesthesia Other Findings Previous cataract 12-15-23 Dr. Ola  Previously had versed 2 mg IV, fentanyl 100 mcg IV and zofran 4 mg IV, plan zofran, barhemsys, avoid fentanyl today  Allergy  Anxiety Diabetes mellitus  Hypertension Hyperlipidemia  NSVD (normal spontaneous vaginal delivery) Arthritis  GERD (gastroesophageal reflux disease) PONV (postoperative nausea and vomiting)   became nauseated intra-op for cataract surgery, received zofran for previous surgery, plan zofran preop for this surgery   Reproductive/Obstetrics negative OB ROS                              Anesthesia Physical Anesthesia Plan  ASA: 2  Anesthesia Plan: MAC   Post-op Pain Management:    Induction: Intravenous  PONV Risk Score and Plan:   Airway Management Planned: Natural Airway and Nasal Cannula  Additional Equipment:   Intra-op Plan:   Post-operative Plan:   Informed Consent: I have reviewed  the patients History and Physical, chart, labs and discussed the procedure including the risks, benefits and alternatives for the proposed anesthesia with the patient or authorized representative who has indicated his/her understanding and acceptance.     Dental Advisory Given  Plan Discussed with: Anesthesiologist, CRNA and Surgeon  Anesthesia Plan Comments: (Patient consented for risks of anesthesia including but not limited to:  - adverse reactions to medications - damage to eyes, teeth, lips or other oral mucosa - nerve damage due to positioning  - sore throat or hoarseness - Damage to heart, brain, nerves, lungs, other parts of body or loss of life  Patient voiced understanding and assent.)         Anesthesia Quick Evaluation

## 2023-12-24 NOTE — Discharge Instructions (Signed)

## 2023-12-29 ENCOUNTER — Ambulatory Visit
Admission: RE | Admit: 2023-12-29 | Discharge: 2023-12-29 | Disposition: A | Discharge status: Home / Self Care | Attending: Ophthalmology | Admitting: Ophthalmology

## 2023-12-29 ENCOUNTER — Other Ambulatory Visit: Payer: Self-pay

## 2023-12-29 ENCOUNTER — Ambulatory Visit: Payer: Self-pay | Admitting: Anesthesiology

## 2023-12-29 ENCOUNTER — Encounter
Admission: RE | Disposition: A | Discharge status: Home / Self Care | Payer: Self-pay | Source: Home / Self Care | Attending: Ophthalmology

## 2023-12-29 ENCOUNTER — Encounter: Payer: Self-pay | Admitting: Ophthalmology

## 2023-12-29 DIAGNOSIS — H25011 Cortical age-related cataract, right eye: Secondary | ICD-10-CM | POA: Diagnosis not present

## 2023-12-29 DIAGNOSIS — H2511 Age-related nuclear cataract, right eye: Secondary | ICD-10-CM | POA: Diagnosis not present

## 2023-12-29 DIAGNOSIS — Z7984 Long term (current) use of oral hypoglycemic drugs: Secondary | ICD-10-CM | POA: Diagnosis not present

## 2023-12-29 DIAGNOSIS — K219 Gastro-esophageal reflux disease without esophagitis: Secondary | ICD-10-CM | POA: Insufficient documentation

## 2023-12-29 DIAGNOSIS — E1136 Type 2 diabetes mellitus with diabetic cataract: Secondary | ICD-10-CM | POA: Diagnosis not present

## 2023-12-29 DIAGNOSIS — Z794 Long term (current) use of insulin: Secondary | ICD-10-CM | POA: Diagnosis not present

## 2023-12-29 DIAGNOSIS — Z9842 Cataract extraction status, left eye: Secondary | ICD-10-CM | POA: Diagnosis not present

## 2023-12-29 DIAGNOSIS — Z7985 Long-term (current) use of injectable non-insulin antidiabetic drugs: Secondary | ICD-10-CM | POA: Diagnosis not present

## 2023-12-29 DIAGNOSIS — E785 Hyperlipidemia, unspecified: Secondary | ICD-10-CM | POA: Diagnosis not present

## 2023-12-29 DIAGNOSIS — I10 Essential (primary) hypertension: Secondary | ICD-10-CM | POA: Diagnosis not present

## 2023-12-29 HISTORY — PX: CATARACT EXTRACTION W/PHACO: SHX586

## 2023-12-29 LAB — GLUCOSE, CAPILLARY: Glucose-Capillary: 162 mg/dL — ABNORMAL HIGH (ref 70–99)

## 2023-12-29 SURGERY — PHACOEMULSIFICATION, CATARACT, WITH IOL INSERTION
Anesthesia: Monitor Anesthesia Care | Site: Eye | Laterality: Right

## 2023-12-29 MED ORDER — MIDAZOLAM HCL 2 MG/2ML IJ SOLN
INTRAMUSCULAR | Status: AC
  Filled 2023-12-29: qty 2

## 2023-12-29 MED ORDER — ONDANSETRON HCL 4 MG/2ML IJ SOLN
4.0000 mg | Freq: Once | INTRAMUSCULAR | Status: AC
  Administered 2023-12-29: 4 mg via INTRAVENOUS

## 2023-12-29 MED ORDER — MIDAZOLAM HCL (PF) 2 MG/2ML IJ SOLN
INTRAMUSCULAR | Status: DC | PRN
  Administered 2023-12-29: 2 mg via INTRAVENOUS

## 2023-12-29 MED ORDER — SIGHTPATH DOSE#1 BSS IO SOLN
INTRAOCULAR | Status: DC | PRN
  Administered 2023-12-29: 15 mL via INTRAOCULAR

## 2023-12-29 MED ORDER — TETRACAINE HCL 0.5 % OP SOLN
OPHTHALMIC | Status: AC
  Filled 2023-12-29: qty 4

## 2023-12-29 MED ORDER — SIGHTPATH DOSE#1 NA HYALUR & NA CHOND-NA HYALUR IO KIT
PACK | INTRAOCULAR | Status: DC | PRN
  Administered 2023-12-29: 1 via OPHTHALMIC

## 2023-12-29 MED ORDER — ARMC OPHTHALMIC DILATING DROPS
1.0000 | OPHTHALMIC | Status: DC | PRN
  Administered 2023-12-29 (×3): 1 via OPHTHALMIC

## 2023-12-29 MED ORDER — MOXIFLOXACIN HCL 0.5 % OP SOLN
OPHTHALMIC | Status: DC | PRN
  Administered 2023-12-29: .2 mL via OPHTHALMIC

## 2023-12-29 MED ORDER — AMISULPRIDE (ANTIEMETIC) 5 MG/2ML IV SOLN
5.0000 mg | Freq: Once | INTRAVENOUS | Status: AC
Start: 2023-12-29 — End: 2023-12-29
  Administered 2023-12-29: 5 mg via INTRAVENOUS

## 2023-12-29 MED ORDER — ONDANSETRON HCL 4 MG/2ML IJ SOLN
INTRAMUSCULAR | Status: AC
  Filled 2023-12-29: qty 2

## 2023-12-29 MED ORDER — BALANCED SALT IO SOLN
INTRAOCULAR | Status: DC | PRN
  Administered 2023-12-29: 4 mL via INTRAOCULAR

## 2023-12-29 MED ORDER — SIGHTPATH DOSE#1 BSS IO SOLN
INTRAOCULAR | Status: DC | PRN
  Administered 2023-12-29: 91 mL via OPHTHALMIC

## 2023-12-29 MED ORDER — TETRACAINE HCL 0.5 % OP SOLN
1.0000 [drp] | OPHTHALMIC | Status: DC | PRN
  Administered 2023-12-29 (×3): 1 [drp] via OPHTHALMIC

## 2023-12-29 MED ORDER — LACTATED RINGERS IV SOLN: INTRAVENOUS | Status: DC

## 2023-12-29 MED ORDER — AMISULPRIDE (ANTIEMETIC) 5 MG/2ML IV SOLN
INTRAVENOUS | Status: AC
  Filled 2023-12-29: qty 2

## 2023-12-29 MED ORDER — ARMC OPHTHALMIC DILATING DROPS
OPHTHALMIC | Status: AC
Start: 2023-12-29 — End: 2023-12-29
  Filled 2023-12-29: qty 0.5

## 2023-12-29 SURGICAL SUPPLY — 13 items
CANNULA ANT/CHMB 27G (MISCELLANEOUS) IMPLANT
DISSECTOR HYDRO NUCLEUS 50X22 (MISCELLANEOUS) ×1 IMPLANT
FEE CATARACT SUITE SIGHTPATH (MISCELLANEOUS) ×1 IMPLANT
GLOVE PI ULTRA LF STRL 7.5 (GLOVE) ×1 IMPLANT
GLOVE SURG SYN 6.5 PF PI BL (GLOVE) ×1 IMPLANT
GLOVE SURG SYN 8.5 PF PI BL (GLOVE) ×1 IMPLANT
LENS IOL PANO PRO 22.0 IMPLANT
NDL FILTER BLUNT 18X1 1/2 (NEEDLE) ×1 IMPLANT
NEEDLE FILTER BLUNT 18X1 1/2 (NEEDLE) ×1 IMPLANT
PACK VIT ANT 23G (MISCELLANEOUS) IMPLANT
RING MALYGIN (MISCELLANEOUS) IMPLANT
SUTURE EHLN 10-0 CS-B-6CS-B-6 (SUTURE) IMPLANT
SYR 3ML LL SCALE MARK (SYRINGE) ×1 IMPLANT

## 2023-12-29 NOTE — Transfer of Care (Signed)
 Immediate Anesthesia Transfer of Care Note  Patient: Deborah Alvarado  Procedure(s) Performed: PHACOEMULSIFICATION, CATARACT, WITH IOL INSERTION 5.96 00:40.9 (Right: Eye)  Patient Location: PACU  Anesthesia Type: MAC  Level of Consciousness: awake, alert  and patient cooperative  Airway and Oxygen Therapy: Patient Spontanous Breathing and Patient connected to supplemental oxygen  Post-op Assessment: Post-op Vital signs reviewed, Patient's Cardiovascular Status Stable, Respiratory Function Stable, Patent Airway and No signs of Nausea or vomiting  Post-op Vital Signs: Reviewed and stable  Complications: No notable events documented.

## 2023-12-29 NOTE — H&P (Signed)
 [facility]   Primary Care Physician:  Cleatus Arlyss RAMAN, MD Ophthalmologist: Dr. Adine Novak  Pre-Procedure History & Physical: HPI:  Deborah Alvarado is a 73 y.o. female here for cataract surgery.   Past Medical History:  Diagnosis Date   Allergy    Anxiety    Arthritis    Diabetes mellitus    Type II   GERD (gastroesophageal reflux disease)    Hyperlipidemia    Hypertension    NSVD (normal spontaneous vaginal delivery)    x 2   PONV (postoperative nausea and vomiting)     Past Surgical History:  Procedure Laterality Date   CATARACT EXTRACTION W/PHACO Left 12/15/2023   Procedure: PHACOEMULSIFICATION, CATARACT, WITH IOL  4.79 00:38.8;  Surgeon: Novak Adine Anes, MD;  Location: [facility];  Service: Ophthalmology;  Laterality: Left;   TOTAL ABDOMINAL HYSTERECTOMY  01/2005   due to fibroids    Prior to Admission medications   Medication Sig Start Date End Date Taking? Authorizing Provider  Cholecalciferol (VITAMIN D ) 50 MCG (2000 UT) tablet Take 1 tablet (2,000 Units total) by mouth daily. 04/15/18  Yes Cleatus Arlyss RAMAN, MD  Continuous Glucose Sensor (FREESTYLE LIBRE 3 PLUS SENSOR) MISC Change sensor every 15 days. 01/28/23  Yes Cleatus Arlyss RAMAN, MD  cyanocobalamin  (VITAMIN B12) 500 MCG tablet Take 1 tablet (500 mcg total) by mouth daily. 08/24/23  Yes Cleatus Arlyss RAMAN, MD  Dulaglutide  (TRULICITY ) 4.5 MG/0.5ML SOAJ Inject 4.5 mg sq once weekly 03/27/23  Yes Cleatus Arlyss RAMAN, MD  famotidine  (PEPCID ) 20 MG tablet Take 1 tablet (20 mg total) by mouth daily as needed for heartburn or indigestion. 05/12/23  Yes Cleatus Arlyss RAMAN, MD  glimepiride  (AMARYL ) 4 MG tablet Take 1 tablet (4 mg total) by mouth daily with breakfast. 05/12/23  Yes Cleatus Arlyss RAMAN, MD  Insulin  Glargine (BASAGLAR  [facility]) 100 UNIT/ML Inject 10 units sq once daily. Increase only as instructed. MDD 15 unit Patient taking differently: 14 Units. Inject 10 units sq once daily. Increase only as instructed. MDD  15 unit 08/15/23  Yes Cleatus Arlyss RAMAN, MD  insulin  lispro (HUMALOG  KWIKPEN) 100 UNIT/ML KwikPen Inject 0-5 units TIDAC only as instructed. MDD 15 unit. Patient taking differently: 6 Units once. Inject 0-5 units TIDAC only as instructed. MDD 15 unit. 07/02/23  Yes Cleatus Arlyss RAMAN, MD  losartan  (COZAAR ) 50 MG tablet TAKE 1 TABLET BY MOUTH DAILY 07/02/23  Yes Cleatus Arlyss RAMAN, MD  metFORMIN  (GLUCOPHAGE -XR) 500 MG 24 hr tablet TAKE 1 TABLET BY MOUTH 2 TIMES DAILY WITH A MEAL. 09/29/23  Yes Cleatus Arlyss RAMAN, MD  simvastatin  (ZOCOR ) 40 MG tablet TAKE 1 TABLET BY MOUTH AT  BEDTIME 07/02/23  Yes Cleatus Arlyss RAMAN, MD  Insulin  Pen Needle (PEN NEEDLES) 31G X 5 MM MISC Use to inject insulin  up to 3 times daily as instructed 08/22/23   Cleatus Arlyss RAMAN, MD    Allergies as of 12/01/2023 - Review Complete 08/15/2023  Allergen Reaction Noted   Lisinopril  07/13/2010   Metformin  and related Other (See Comments) 08/02/2014    Family History  Problem Relation Age of Onset   Cancer Mother        Leukemia   Diabetes Brother    Breast cancer Maternal Grandmother    Colon cancer Neg Hx     Social History   Socioeconomic History   Marital status: Married    Spouse name: Not on file   Number of children: 2   Years of  education: Not on file   Highest education level: Not on file  Occupational History    Employer: [employer]  Tobacco Use   Smoking status: Never   Smokeless tobacco: Never  Substance and Sexual Activity   Alcohol use: No    Alcohol/week: 0.0 standard drinks of alcohol   Drug use: No   Sexual activity: Not on file  Other Topics Concern   Not on file  Social History Narrative   Grandchild died 2023-05-11   Worked at [employer], retired 2019   Social Drivers of [employer] Strain: Low Risk  (01/30/2023)   Overall Financial Resource Strain (CARDIA)    Difficulty of Paying Living Expenses: Not hard at all  Food Insecurity: No Food Insecurity (01/30/2023)   Hunger Vital  Sign    Worried About Running Out of Food in the Last Year: Never true    Ran Out of Food in the Last Year: Never true  Transportation Needs: No Transportation Needs (01/30/2023)   PRAPARE - Administrator, Civil Service (Medical): No    Lack of Transportation (Non-Medical): No  Physical Activity: Insufficiently Active (01/30/2023)   Exercise Vital Sign    Days of Exercise per Week: 3 days    Minutes of Exercise per Session: 30 min  Stress: No Stress Concern Present (01/30/2023)   [employer] of Occupational Health - Occupational Stress Questionnaire    Feeling of Stress : Not at all  Social Connections: Moderately Isolated (01/30/2023)   Social Connection and Isolation Panel    Frequency of Communication with Friends and Family: More than three times a week    Frequency of Social Gatherings with Friends and Family: More than three times a week    Attends Religious Services: Never    Database administrator or Organizations: No    Attends Banker Meetings: Never    Marital Status: Married  Catering manager Violence: Not At Risk (01/30/2023)   Humiliation, Afraid, Rape, and Kick questionnaire    Fear of Current or Ex-Partner: No    Emotionally Abused: No    Physically Abused: No    Sexually Abused: No    Review of Systems: See HPI, otherwise negative ROS  Physical Exam: BP (!) 162/82   Temp 97.8 F (36.6 C) (Temporal)   Resp 11   Ht 5' 4.02 (1.626 m)   Wt 71.6 kg   SpO2 99%   BMI 27.09 kg/m  General:   Alert, cooperative. Head:  Normocephalic and atraumatic. Respiratory:  Normal work of breathing. Cardiovascular:  NAD  Impression/Plan: Deborah Alvarado is here for cataract surgery.  Risks, benefits, limitations, and alternatives regarding cataract surgery have been reviewed with the patient.  Questions have been answered.  All parties agreeable.   Adine Novak, MD  12/29/2023, 10:09 AM

## 2023-12-29 NOTE — Op Note (Signed)
 OPERATIVE NOTE  ZAMZAM WHINERY [id number] 12/29/2023   PREOPERATIVE DIAGNOSIS:  Nuclear sclerotic cataract right eye.  H25.11   POSTOPERATIVE DIAGNOSIS:    Nuclear sclerotic cataract right eye.     PROCEDURE:  Phacoemusification with posterior chamber intraocular lens placement of the right eye   LENS:   Implant Name Type Inv. Item Serial No. Manufacturer Lot No. LRB No. Used Action  LENS IOL PANO PRO 22.0 - [id number]  LENS IOL PANO PRO 22.0 [id number] SIGHT[employer]  Right 1 Implanted       Procedure(s): PHACOEMULSIFICATION, CATARACT, WITH IOL INSERTION 5.96 00:40.9 (Right)  SURGEON:  Adine Novak, MD, MPH  ANESTHESIOLOGIST: Anesthesiologist: Ola Donny BROCKS, MD CRNA: Myra Lawless, CRNA   ANESTHESIA:  Topical with tetracaine drops augmented with 1% preservative-free intracameral lidocaine.  ESTIMATED BLOOD LOSS: less than 1 mL.   COMPLICATIONS:  None.   DESCRIPTION OF PROCEDURE:  The patient was identified in the holding room and transported to the operating room and placed in the supine position under the operating microscope.  The right eye was identified as the operative eye and it was prepped and draped in the usual sterile ophthalmic fashion.   A 1.0 millimeter clear-corneal paracentesis was made at the 10:30 position. 0.5 ml of preservative-free 1% lidocaine with epinephrine was injected into the anterior chamber.  The anterior chamber was filled with viscoelastic.  A 2.4 millimeter keratome was used to make a near-clear corneal incision at the 8:00 position.  A curvilinear capsulorrhexis was made with a cystotome and capsulorrhexis forceps.  Balanced salt solution was used to hydrodissect and hydrodelineate the nucleus.   Phacoemulsification was then used in stop and chop fashion to remove the lens nucleus and epinucleus.  The remaining cortex was then removed using the irrigation and aspiration handpiece. Viscoelastic was then placed into the capsular bag to distend it  for lens placement.  A lens was then injected into the capsular bag.  The remaining viscoelastic was aspirated.   Wounds were hydrated with balanced salt solution.  The anterior chamber was inflated to a physiologic pressure with balanced salt solution.   Intracameral vigamox 0.1 mL undiluted was injected into the eye and a drop placed onto the ocular surface.  No wound leaks were noted.  The patient was taken to the recovery room in stable condition without complications of anesthesia or surgery  Adine Novak 12/29/2023, 10:36 AM

## 2023-12-29 NOTE — Anesthesia Postprocedure Evaluation (Signed)
 Anesthesia Post Note  Patient: Deborah Alvarado  Procedure(s) Performed: PHACOEMULSIFICATION, CATARACT, WITH IOL INSERTION 5.96 00:40.9 (Right: Eye)  Patient location during evaluation: PACU Anesthesia Type: MAC Level of consciousness: awake and alert Pain management: pain level controlled Vital Signs Assessment: post-procedure vital signs reviewed and stable Respiratory status: spontaneous breathing, nonlabored ventilation, respiratory function stable and patient connected to nasal cannula oxygen Cardiovascular status: stable and blood pressure returned to baseline Postop Assessment: no apparent nausea or vomiting Anesthetic complications: no   No notable events documented.   Last Vitals:  Vitals:   12/29/23 1037 12/29/23 1041  BP: 120/72 120/72  Pulse: 96 93  Resp: 18 17  Temp: (!) 36.3 C (!) 36.4 C  SpO2: 99% 97%    Last Pain:  Vitals:   12/29/23 1041  TempSrc:   PainSc: 0-No pain                 Aeson Sawyers C Arlow Spiers

## 2024-01-05 ENCOUNTER — Telehealth: Payer: Self-pay

## 2024-01-05 NOTE — Telephone Encounter (Signed)
 Filled out and mailed pt portion today, will fax provider portion. For lillycares trulicity , basaglar , and humalog 

## 2024-01-14 ENCOUNTER — Other Ambulatory Visit (HOSPITAL_COMMUNITY): Payer: Self-pay

## 2024-01-17 ENCOUNTER — Other Ambulatory Visit: Payer: Self-pay | Admitting: Family Medicine

## 2024-01-19 NOTE — Telephone Encounter (Signed)
 Message from pharmacy:  Please send a replace/new response with 100-Day Supply if appropriate to maximize member benefit. Requesting 1 year supply.   Glimepiride  Last filled:  11/11/23, #90 Last OV:  08/15/23 Next OV:  none

## 2024-01-20 ENCOUNTER — Telehealth: Payer: Self-pay

## 2024-01-20 ENCOUNTER — Other Ambulatory Visit (HOSPITAL_COMMUNITY): Payer: Self-pay

## 2024-01-20 NOTE — Telephone Encounter (Signed)
 Pharmacy Patient Advocate Encounter  Received notification from [employer] that Prior Authorization for [facility] 3 plus sensors has been APPROVED from 01/20/24 to 03/10/25. Ran test claim, Copay is $0.00. This test claim was processed through [facility]- copay amounts may vary at other pharmacies due to pharmacy/plan contracts, or as the patient moves through the different stages of their insurance plan.   PA #/Case ID/Reference #: # [id number]

## 2024-01-20 NOTE — Telephone Encounter (Signed)
 Pharmacy Patient Advocate Encounter   Received notification from Onbase that prior authorization for [facility] 3 plus sensors is required/requested.   Insurance verification completed.   The patient is insured through [facility].   Per test claim: PA required; PA submitted to above mentioned insurance via Latent Key/confirmation #/EOC [id number] Status is pending

## 2024-01-21 NOTE — Telephone Encounter (Signed)
 Attempted to call patient, no answer.  LMTCB.  OK to relay message concerning prior auth.

## 2024-01-22 NOTE — Telephone Encounter (Signed)
 Left message to return call to our office.  Please provide  message from provider/office when call is returned from patient.

## 2024-01-27 NOTE — Telephone Encounter (Signed)
 Left message to call the office back.

## 2024-01-27 NOTE — Telephone Encounter (Unsigned)
 Copied from CRM [id number]. Topic: Clinical - Medication Prior Auth >> Jan 27, 2024  3:46 PM Dedra B wrote: Reason for CRM: Pt returning call regarding PA for freestyle libre sensors. Relayed message verbatim. Pt said she had them.

## 2024-02-02 ENCOUNTER — Ambulatory Visit: Payer: Medicare Other

## 2024-02-02 VITALS — BP 130/80 | Ht 64.0 in | Wt 159.0 lb

## 2024-02-02 DIAGNOSIS — Z0000 Encounter for general adult medical examination without abnormal findings: Secondary | ICD-10-CM | POA: Diagnosis not present

## 2024-02-02 NOTE — Progress Notes (Signed)
 I connected with  Deborah Alvarado on 02/02/24 by a audio enabled telemedicine application and verified that I am speaking with the correct person using two identifiers.  Patient Location: Home  Provider Location: Home Office  Persons Participating in Visit: Patient.  I discussed the limitations of evaluation and management by telemedicine. The patient expressed understanding and agreed to proceed.  Vital Signs: Because this visit was a virtual/telehealth visit, some criteria may be missing or patient reported. Any vitals not documented were not able to be obtained and vitals that have been documented are patient reported.   Because this visit was a virtual/telehealth visit,  certain criteria was not obtained, such a blood pressure, CBG if applicable, and timed get up and go. Any medications not marked as taking were not mentioned during the medication reconciliation part of the visit. Any vitals not documented were not able to be obtained due to this being a telehealth visit or patient was unable to self-report a recent blood pressure reading due to a lack of equipment at home via telehealth. Vitals that have been documented are verbally provided by the patient.  This visit was performed by a medical professional under my direct supervision. I was immediately available for consultation/collaboration. I have reviewed and agree with the Annual Wellness Visit documentation.  Chief Complaint  Patient presents with   Medicare Wellness     Subjective:   Deborah Alvarado is a 73 y.o. female who presents for a Medicare Annual Wellness Visit.  Allergies (verified) Lisinopril and Metformin  and related   History: Past Medical History:  Diagnosis Date   Allergy    Anxiety    Arthritis    Diabetes mellitus    Type II   GERD (gastroesophageal reflux disease)    Hyperlipidemia    Hypertension    NSVD (normal spontaneous vaginal delivery)    x 2   PONV (postoperative nausea and vomiting)     Past Surgical History:  Procedure Laterality Date   CATARACT EXTRACTION W/PHACO Left 12/15/2023   Procedure: PHACOEMULSIFICATION, CATARACT, WITH IOL  4.79 00:38.8;  Surgeon: Myrna Adine Anes, MD;  Location: [facility];  Service: Ophthalmology;  Laterality: Left;   CATARACT EXTRACTION W/PHACO Right 12/29/2023   Procedure: PHACOEMULSIFICATION, CATARACT, WITH IOL INSERTION 5.96 00:40.9;  Surgeon: Myrna Adine Anes, MD;  Location: [facility];  Service: Ophthalmology;  Laterality: Right;   TOTAL ABDOMINAL HYSTERECTOMY  01/2005   due to fibroids   Family History  Problem Relation Age of Onset   Cancer Mother        Leukemia   Diabetes Brother    Breast cancer Maternal Grandmother    Colon cancer Neg Hx    Social History   Occupational History    Employer: [employer]  Tobacco Use   Smoking status: Never   Smokeless tobacco: Never  Substance and Sexual Activity   Alcohol use: No    Alcohol/week: 0.0 standard drinks of alcohol   Drug use: No   Sexual activity: Not on file   Tobacco Counseling Counseling given: Not Answered  SDOH Screenings   Food Insecurity: No Food Insecurity (02/02/2024)  Housing: Low Risk  (02/02/2024)  Transportation Needs: No Transportation Needs (02/02/2024)  Utilities: Not At Risk (02/02/2024)  Alcohol Screen: Low Risk  (01/30/2023)  Depression (PHQ2-9): Low Risk  (02/02/2024)  Financial Resource Strain: Low Risk  (01/30/2023)  Physical Activity: Sufficiently Active (02/02/2024)  Social Connections: Moderately Isolated (02/02/2024)  Stress: No Stress Concern Present (02/02/2024)  Tobacco Use: Low Risk  (02/02/2024)  Health Literacy: Adequate Health Literacy (02/02/2024)   See flowsheets for full screening details  Depression Screen PHQ 2 & 9 Depression Scale- Over the past 2 weeks, how often have you been bothered by any of the following problems? Little interest or pleasure in doing things: 0 Feeling down, depressed,  or hopeless (PHQ Adolescent also includes...irritable): 0 PHQ-2 Total Score: 0 Trouble falling or staying asleep, or sleeping too much: 0 Feeling tired or having little energy: 0 Poor appetite or overeating (PHQ Adolescent also includes...weight loss): 0 Feeling bad about yourself - or that you are a failure or have let yourself or your family down: 0 Trouble concentrating on things, such as reading the newspaper or watching television (PHQ Adolescent also includes...like school work): 0 Moving or speaking so slowly that other people could have noticed. Or the opposite - being so fidgety or restless that you have been moving around a lot more than usual: 0 Thoughts that you would be better off dead, or of hurting yourself in some way: 0 PHQ-9 Total Score: 0 If you checked off any problems, how difficult have these problems made it for you to do your work, take care of things at home, or get along with other people?: Not difficult at all  Depression Treatment Depression Interventions/Treatment : EYV7-0 Score <4 Follow-up Not Indicated     Goals Addressed             This Visit's Progress    Patient Stated   On track    Would like to maintain drinking more water       Visit info / Clinical Intake: Medicare Wellness Visit Type:: Subsequent Annual Wellness Visit Persons participating in visit:: patient Medicare Wellness Visit Mode:: Telephone If telephone:: video declined Because this visit was a virtual/telehealth visit:: pt reported vitals If Telephone or Video please confirm:: I connected with the patient using audio enabled telemedicine application and verified that I am speaking with the correct person using two identifiers; I discussed the limitations of evaluation and management by telemedicine; The patient expressed understanding and agreed to proceed Patient Location:: home Provider Location:: home office Information given by:: patient Interpreter Needed?: No Pre-visit  prep was completed: yes AWV questionnaire completed by patient prior to visit?: no Living arrangements:: lives with spouse/significant other Patient's Overall Health Status Rating: very good Typical amount of pain: none Does pain affect daily life?: no Are you currently prescribed opioids?: no  Dietary Habits and Nutritional Risks How many meals a day?: 2 Eats fruit and vegetables daily?: yes Most meals are obtained by: preparing own meals In the last 2 weeks, have you had any of the following?: none Diabetic:: (!) yes Any non-healing wounds?: no How often do you check your BS?: continuous glucose monitor Would you like to be referred to a Nutritionist or for Diabetic Management? : no  Functional Status Activities of Daily Living (to include ambulation/medication): Independent Ambulation: Independent Medication Administration: Independent Home Management: Independent Manage your own finances?: yes Primary transportation is: driving Concerns about vision?: no *vision screening is required for WTM* Concerns about hearing?: no  Fall Screening Falls in the past year?: 0 Number of falls in past year: 0 Was there an injury with Fall?: 0 Fall Risk Category Calculator: 0 Patient Fall Risk Level: Low Fall Risk  Fall Risk Patient at Risk for Falls Due to: No Fall Risks Fall risk Follow up: Falls evaluation completed  Home and Transportation Safety: All rugs have  non-skid backing?: N/A, no rugs All stairs or steps have railings?: yes Grab bars in the bathtub or shower?: yes Have non-skid surface in bathtub or shower?: yes Good home lighting?: yes Regular seat belt use?: yes Hospital stays in the last year:: no  Cognitive Assessment Difficulty concentrating, remembering, or making decisions? : no Will 6CIT or Mini Cog be Completed: no 6CIT or Mini Cog Declined: patient declined  Advance Directives (For Healthcare) Does Patient Have a Medical Advance Directive?: No Does  patient want to make changes to medical advance directive?: No - Patient declined Type of Advance Directive: Healthcare Power of [place]; Living will Copy of Healthcare Power of Attorney in Chart?: Yes - validated most recent copy scanned in chart (See row information) Would patient like information on creating a medical advance directive?: No - Patient declined  Reviewed/Updated  Reviewed/Updated: Reviewed All (Medical, Surgical, Family, Medications, Allergies, Care Teams, Patient Goals)        Objective:    Today's Vitals   02/02/24 0906  BP: 130/80  Weight: 159 lb 0.2 oz (72.1 kg)  Height: 5' 4 (1.626 m)   Body mass index is 27.29 kg/m.  Current Medications (verified) Outpatient Encounter Medications as of 02/02/2024  Medication Sig   Cholecalciferol (VITAMIN D ) 50 MCG (2000 UT) tablet Take 1 tablet (2,000 Units total) by mouth daily.   Continuous Glucose Sensor (FREESTYLE LIBRE 3 PLUS SENSOR) MISC Change sensor every 15 days.   cyanocobalamin  (VITAMIN B12) 500 MCG tablet Take 1 tablet (500 mcg total) by mouth daily.   Dulaglutide  (TRULICITY ) 4.5 MG/0.5ML SOAJ Inject 4.5 mg sq once weekly   famotidine  (PEPCID ) 20 MG tablet Take 1 tablet (20 mg total) by mouth daily as needed for heartburn or indigestion.   glimepiride  (AMARYL ) 4 MG tablet TAKE 1 TABLET BY MOUTH DAILY  WITH BREAKFAST   Insulin  Glargine (BASAGLAR  KWIKPEN) 100 UNIT/ML Inject 10 units sq once daily. Increase only as instructed. MDD 15 unit (Patient taking differently: 14 Units. Inject 10 units sq once daily. Increase only as instructed. MDD 15 unit)   insulin  lispro (HUMALOG  KWIKPEN) 100 UNIT/ML KwikPen Inject 0-5 units TIDAC only as instructed. MDD 15 unit. (Patient taking differently: 6 Units once. Inject 0-5 units TIDAC only as instructed. MDD 15 unit.)   Insulin  Pen Needle (PEN NEEDLES) 31G X 5 MM MISC Use to inject insulin  up to 3 times daily as instructed   losartan  (COZAAR ) 50 MG tablet TAKE 1 TABLET BY  MOUTH DAILY   metFORMIN  (GLUCOPHAGE -XR) 500 MG 24 hr tablet TAKE 1 TABLET BY MOUTH 2 TIMES DAILY WITH A MEAL.   simvastatin  (ZOCOR ) 40 MG tablet TAKE 1 TABLET BY MOUTH AT  BEDTIME   No facility-administered encounter medications on file as of 02/02/2024.   Hearing/Vision screen Hearing Screening - Comments:: No difficulties Vision Screening - Comments:: No vision issues Immunizations and Health Maintenance Health Maintenance  Topic Date Due   Influenza Vaccine  10/10/2023   Colonoscopy  11/06/2023   COVID-19 Vaccine (10 - 2025-26 season) 11/10/2023   Medicare Annual Wellness (AWV)  01/30/2024   Pneumococcal Vaccine: 50+ Years (3 of 3 - PCV20 or PCV21) 05/12/2043 (Originally 01/05/2024)   HEMOGLOBIN A1C  02/14/2024   Diabetic kidney evaluation - eGFR measurement  05/11/2024   Diabetic kidney evaluation - Urine ACR  05/11/2024   FOOT EXAM  05/11/2024   OPHTHALMOLOGY EXAM  11/25/2024   Mammogram  04/28/2025   DTaP/Tdap/Td (3 - Td or Tdap) 02/07/2027   Bone Density Scan  Completed   Hepatitis C Screening  Completed   Zoster Vaccines- Shingrix  Completed   Meningococcal B Vaccine  Aged Out   Hepatitis B Vaccines 19-59 Average Risk  Discontinued        Assessment/Plan:  This is a routine wellness examination for Moreen.  Patient Care Team: Cleatus Arlyss RAMAN, MD as PCP - General (Family Medicine) Cherilyn Debby CROME, MD as Consulting Physician (Internal Medicine) Myrna Adine Anes, MD as Consulting Physician (Ophthalmology) Geronimo Manuelita SAUNDERS, [facility] (Pharmacist)  I have personally reviewed and noted the following in the patient's chart:   Medical and social history Use of alcohol, tobacco or illicit drugs  Current medications and supplements including opioid prescriptions. Functional ability and status Nutritional status Physical activity Advanced directives List of other physicians Hospitalizations, surgeries, and ER visits in previous 12 months Vitals Screenings to include  cognitive, depression, and falls Referrals and appointments  No orders of the defined types were placed in this encounter.  In addition, I have reviewed and discussed with patient certain preventive protocols, quality metrics, and best practice recommendations. A written personalized care plan for preventive services as well as general preventive health recommendations were provided to patient.   Lyle MARLA Right, CMA   02/02/2024   No follow-ups on file.  After Visit Summary: (MyChart) Due to this being a telephonic visit, the after visit summary with patients personalized plan was offered to patient via MyChart   Nurse Notes: nothing to report

## 2024-02-02 NOTE — Patient Instructions (Signed)
 Deborah Alvarado,  Thank you for taking the time for your Medicare Wellness Visit. I appreciate your continued commitment to your health goals. Please review the care plan we discussed, and feel free to reach out if I can assist you further.  Please note that Annual Wellness Visits do not include a physical exam. Some assessments may be limited, especially if the visit was conducted virtually. If needed, we may recommend an in-person follow-up with your provider.  Ongoing Care Seeing your primary care provider every 3 to 6 months helps us  monitor your health and provide consistent, personalized care.   Referrals If a referral was made during today's visit and you haven't received any updates within two weeks, please contact the referred provider directly to check on the status.  Recommended Screenings:  Health Maintenance  Topic Date Due   Flu Shot  10/10/2023   Colon Cancer Screening  11/06/2023   COVID-19 Vaccine (10 - 2025-26 season) 11/10/2023   Medicare Annual Wellness Visit  01/30/2024   Pneumococcal Vaccine for age over 51 (3 of 3 - PCV20 or PCV21) 05/12/2043*   Hemoglobin A1C  02/14/2024   Yearly kidney function blood test for diabetes  05/11/2024   Yearly kidney health urinalysis for diabetes  05/11/2024   Complete foot exam   05/11/2024   Eye exam for diabetics  11/25/2024   Breast Cancer Screening  04/28/2025   DTaP/Tdap/Td vaccine (3 - Td or Tdap) 02/07/2027   Osteoporosis screening with Bone Density Scan  Completed   Hepatitis C Screening  Completed   Zoster (Shingles) Vaccine  Completed   Meningitis B Vaccine  Aged Out   Hepatitis B Vaccine  Discontinued  *Topic was postponed. The date shown is not the original due date.       02/02/2024    9:08 AM  Advanced Directives  Does Patient Have a Medical Advance Directive? No  Would patient like information on creating a medical advance directive? No - Patient declined    Vision: Annual vision screenings are recommended  for early detection of glaucoma, cataracts, and diabetic retinopathy. These exams can also reveal signs of chronic conditions such as diabetes and high blood pressure.  Dental: Annual dental screenings help detect early signs of oral cancer, gum disease, and other conditions linked to overall health, including heart disease and diabetes.  Please see the attached documents for additional preventive care recommendations.

## 2024-02-03 NOTE — Telephone Encounter (Signed)
 Gave pt a call pt has not return pap [facility] Cares back left a HIPAA VM.

## 2024-03-02 ENCOUNTER — Other Ambulatory Visit: Payer: Self-pay | Admitting: Pharmacist

## 2024-03-02 ENCOUNTER — Telehealth: Payer: Self-pay | Admitting: Pharmacist

## 2024-03-02 DIAGNOSIS — E119 Type 2 diabetes mellitus without complications: Secondary | ICD-10-CM

## 2024-03-02 NOTE — Progress Notes (Signed)
 Brief Telephone Documentation Reason for Call: Insulin /blood sugar review  Called patient to review CGM data/insulin  dosing. Left voicemail for patient will direct line to return call.   Reminder set to attempt outreach again next week if patient has not followed up.

## 2024-03-02 NOTE — Progress Notes (Signed)
 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 Submitted.  Income Documentation: N/A - Electronic verification elected. Application ID: [id number]  Provider Portion of Application:  12/23: Provider portion completed by PharmD and uploaded PCP eFax folder for signature.  Prescription(s): Electronic Rx sent to [facility])  Next Steps: [x]    Application filled out and uploaded to [facility] eFax folder for review/signature []    Upon PCP signature Application to be faxed to [facility] PAP team AND scanned to chart  PAP Team: CPhT Patient Advocate Team Fax: [phone]  Forwarded to [facility] CPhT Patient Advocate Team Note routed to PCP Clinic Pool to ensure PCP signature is obtained and application is faxed.  [facility] PAP spreadsheet updated.

## 2024-03-03 MED ORDER — TRULICITY 4.5 MG/0.5ML ~~LOC~~ SOAJ: SUBCUTANEOUS | 3 refills | Status: AC

## 2024-03-03 MED ORDER — INSULIN LISPRO (1 UNIT DIAL) 100 UNIT/ML (KWIKPEN): PEN_INJECTOR | SUBCUTANEOUS | 3 refills | Status: AC

## 2024-03-03 MED ORDER — BASAGLAR KWIKPEN 100 UNIT/ML ~~LOC~~ SOPN: PEN_INJECTOR | SUBCUTANEOUS | 3 refills | Status: AC

## 2024-03-03 NOTE — Telephone Encounter (Signed)
I signed the orders.   Thanks!

## 2024-03-16 NOTE — Progress Notes (Signed)
 DAI APEL                                          MRN: [id number]   03/16/2024   The [facility] Quality Team Specialist reviewed this patient medical record for the purposes of chart review for care gap closure. The following were reviewed: abstraction for care gap closure-glycemic status assessment.    [facility] Quality Team

## 2024-03-20 ENCOUNTER — Other Ambulatory Visit: Payer: Self-pay | Admitting: Family Medicine

## 2024-03-20 DIAGNOSIS — E785 Hyperlipidemia, unspecified: Secondary | ICD-10-CM

## 2024-03-20 DIAGNOSIS — I10 Essential (primary) hypertension: Secondary | ICD-10-CM

## 2024-03-26 ENCOUNTER — Other Ambulatory Visit: Payer: Self-pay | Admitting: Family Medicine

## 2024-03-26 NOTE — Telephone Encounter (Signed)
 error

## 2024-03-26 NOTE — Telephone Encounter (Signed)
 Received approval letter from [facility] Basaglar  and Humalog  thru 03/10/2025,approval letter index.

## 2024-03-31 ENCOUNTER — Other Ambulatory Visit: Payer: Self-pay | Admitting: Family Medicine

## 2024-03-31 DIAGNOSIS — Z1231 Encounter for screening mammogram for malignant neoplasm of breast: Secondary | ICD-10-CM

## 2024-04-29 ENCOUNTER — Encounter

## 2024-05-13 ENCOUNTER — Encounter: Admitting: Family Medicine
